# Patient Record
Sex: Female | Born: 1955 | Race: Black or African American | Hispanic: No | Smoking: Current every day smoker
Health system: Southern US, Community
[De-identification: ages and names within clinical notes are randomized; demographics above are authoritative.]

## PROBLEM LIST (undated history)

## (undated) DIAGNOSIS — G43909 Migraine, unspecified, not intractable, without status migrainosus: Secondary | ICD-10-CM

## (undated) DIAGNOSIS — I774 Celiac artery compression syndrome: Secondary | ICD-10-CM

## (undated) DIAGNOSIS — C801 Malignant (primary) neoplasm, unspecified: Secondary | ICD-10-CM

## (undated) DIAGNOSIS — E785 Hyperlipidemia, unspecified: Secondary | ICD-10-CM

## (undated) DIAGNOSIS — R079 Chest pain, unspecified: Secondary | ICD-10-CM

## (undated) HISTORY — PX: ABDOMINAL HYSTERECTOMY: SHX81

## (undated) HISTORY — PX: SPLENECTOMY: SUR1306

## (undated) HISTORY — DX: Celiac artery compression syndrome: I77.4

---

## 1999-07-28 ENCOUNTER — Ambulatory Visit (HOSPITAL_COMMUNITY): Admission: RE | Admit: 1999-07-28 | Discharge: 1999-07-28 | Payer: Self-pay | Admitting: Neurosurgery

## 1999-08-11 ENCOUNTER — Encounter: Payer: Self-pay | Admitting: Neurosurgery

## 1999-08-11 ENCOUNTER — Ambulatory Visit (HOSPITAL_COMMUNITY): Admission: RE | Admit: 1999-08-11 | Discharge: 1999-08-11 | Payer: Self-pay | Admitting: Neurosurgery

## 2001-05-22 ENCOUNTER — Emergency Department (HOSPITAL_COMMUNITY): Admission: EM | Admit: 2001-05-22 | Discharge: 2001-05-22 | Payer: Self-pay | Admitting: Emergency Medicine

## 2001-06-20 ENCOUNTER — Emergency Department (HOSPITAL_COMMUNITY): Admission: EM | Admit: 2001-06-20 | Discharge: 2001-06-20 | Payer: Self-pay | Admitting: *Deleted

## 2001-08-21 ENCOUNTER — Encounter: Payer: Self-pay | Admitting: Emergency Medicine

## 2001-08-21 ENCOUNTER — Emergency Department (HOSPITAL_COMMUNITY): Admission: EM | Admit: 2001-08-21 | Discharge: 2001-08-21 | Payer: Self-pay | Admitting: Emergency Medicine

## 2001-11-01 ENCOUNTER — Encounter: Payer: Self-pay | Admitting: Family Medicine

## 2001-11-01 ENCOUNTER — Ambulatory Visit (HOSPITAL_COMMUNITY): Admission: RE | Admit: 2001-11-01 | Discharge: 2001-11-01 | Payer: Self-pay | Admitting: Family Medicine

## 2001-11-07 ENCOUNTER — Ambulatory Visit (HOSPITAL_COMMUNITY): Admission: RE | Admit: 2001-11-07 | Discharge: 2001-11-07 | Payer: Self-pay | Admitting: Family Medicine

## 2001-11-07 ENCOUNTER — Encounter: Payer: Self-pay | Admitting: Family Medicine

## 2001-11-15 ENCOUNTER — Ambulatory Visit (HOSPITAL_COMMUNITY): Admission: RE | Admit: 2001-11-15 | Discharge: 2001-11-15 | Payer: Self-pay | Admitting: Family Medicine

## 2001-11-15 ENCOUNTER — Encounter: Payer: Self-pay | Admitting: Family Medicine

## 2001-11-24 ENCOUNTER — Encounter: Payer: Self-pay | Admitting: General Surgery

## 2001-11-24 ENCOUNTER — Ambulatory Visit (HOSPITAL_COMMUNITY): Admission: RE | Admit: 2001-11-24 | Discharge: 2001-11-24 | Payer: Self-pay | Admitting: General Surgery

## 2001-11-28 ENCOUNTER — Inpatient Hospital Stay (HOSPITAL_COMMUNITY): Admission: RE | Admit: 2001-11-28 | Discharge: 2001-12-02 | Payer: Self-pay | Admitting: General Surgery

## 2001-12-06 ENCOUNTER — Emergency Department (HOSPITAL_COMMUNITY): Admission: EM | Admit: 2001-12-06 | Discharge: 2001-12-06 | Payer: Self-pay | Admitting: Emergency Medicine

## 2001-12-09 ENCOUNTER — Encounter: Payer: Self-pay | Admitting: Internal Medicine

## 2001-12-09 ENCOUNTER — Emergency Department (HOSPITAL_COMMUNITY): Admission: EM | Admit: 2001-12-09 | Discharge: 2001-12-09 | Payer: Self-pay | Admitting: Internal Medicine

## 2001-12-11 ENCOUNTER — Inpatient Hospital Stay (HOSPITAL_COMMUNITY): Admission: EM | Admit: 2001-12-11 | Discharge: 2001-12-13 | Payer: Self-pay | Admitting: Emergency Medicine

## 2001-12-12 ENCOUNTER — Encounter: Payer: Self-pay | Admitting: Family Medicine

## 2002-02-02 ENCOUNTER — Encounter: Payer: Self-pay | Admitting: *Deleted

## 2002-02-02 ENCOUNTER — Emergency Department (HOSPITAL_COMMUNITY): Admission: EM | Admit: 2002-02-02 | Discharge: 2002-02-02 | Payer: Self-pay | Admitting: *Deleted

## 2002-08-19 ENCOUNTER — Emergency Department (HOSPITAL_COMMUNITY): Admission: EM | Admit: 2002-08-19 | Discharge: 2002-08-19 | Payer: Self-pay | Admitting: Emergency Medicine

## 2003-05-16 ENCOUNTER — Emergency Department (HOSPITAL_COMMUNITY): Admission: EM | Admit: 2003-05-16 | Discharge: 2003-05-16 | Payer: Self-pay | Admitting: *Deleted

## 2003-05-16 ENCOUNTER — Encounter: Payer: Self-pay | Admitting: *Deleted

## 2003-05-22 ENCOUNTER — Emergency Department (HOSPITAL_COMMUNITY): Admission: EM | Admit: 2003-05-22 | Discharge: 2003-05-22 | Payer: Self-pay | Admitting: Emergency Medicine

## 2003-05-23 ENCOUNTER — Ambulatory Visit (HOSPITAL_COMMUNITY): Admission: RE | Admit: 2003-05-23 | Discharge: 2003-05-23 | Payer: Self-pay | Admitting: Family Medicine

## 2003-05-23 ENCOUNTER — Encounter: Payer: Self-pay | Admitting: Family Medicine

## 2003-06-06 ENCOUNTER — Emergency Department (HOSPITAL_COMMUNITY): Admission: EM | Admit: 2003-06-06 | Discharge: 2003-06-06 | Payer: Self-pay | Admitting: Emergency Medicine

## 2004-01-17 ENCOUNTER — Emergency Department (HOSPITAL_COMMUNITY): Admission: EM | Admit: 2004-01-17 | Discharge: 2004-01-17 | Payer: Self-pay | Admitting: Emergency Medicine

## 2004-03-31 ENCOUNTER — Emergency Department (HOSPITAL_COMMUNITY): Admission: EM | Admit: 2004-03-31 | Discharge: 2004-03-31 | Payer: Self-pay | Admitting: *Deleted

## 2004-04-22 ENCOUNTER — Emergency Department (HOSPITAL_COMMUNITY): Admission: EM | Admit: 2004-04-22 | Discharge: 2004-04-22 | Payer: Self-pay | Admitting: Emergency Medicine

## 2004-05-11 ENCOUNTER — Emergency Department (HOSPITAL_COMMUNITY): Admission: EM | Admit: 2004-05-11 | Discharge: 2004-05-11 | Payer: Self-pay | Admitting: *Deleted

## 2004-06-18 ENCOUNTER — Emergency Department (HOSPITAL_COMMUNITY): Admission: EM | Admit: 2004-06-18 | Discharge: 2004-06-18 | Payer: Self-pay | Admitting: Emergency Medicine

## 2004-08-12 ENCOUNTER — Emergency Department (HOSPITAL_COMMUNITY): Admission: EM | Admit: 2004-08-12 | Discharge: 2004-08-12 | Payer: Self-pay | Admitting: Internal Medicine

## 2004-09-13 ENCOUNTER — Emergency Department (HOSPITAL_COMMUNITY): Admission: EM | Admit: 2004-09-13 | Discharge: 2004-09-13 | Payer: Self-pay | Admitting: Emergency Medicine

## 2004-10-19 ENCOUNTER — Ambulatory Visit (HOSPITAL_COMMUNITY): Admission: RE | Admit: 2004-10-19 | Discharge: 2004-10-19 | Payer: Self-pay | Admitting: Family Medicine

## 2005-08-06 ENCOUNTER — Emergency Department (HOSPITAL_COMMUNITY): Admission: EM | Admit: 2005-08-06 | Discharge: 2005-08-06 | Payer: Self-pay | Admitting: Emergency Medicine

## 2005-09-01 ENCOUNTER — Emergency Department (HOSPITAL_COMMUNITY): Admission: EM | Admit: 2005-09-01 | Discharge: 2005-09-01 | Payer: Self-pay | Admitting: Emergency Medicine

## 2005-09-23 ENCOUNTER — Ambulatory Visit: Payer: Self-pay | Admitting: Orthopedic Surgery

## 2005-10-06 ENCOUNTER — Encounter: Payer: Self-pay | Admitting: Orthopedic Surgery

## 2005-10-13 ENCOUNTER — Encounter: Admission: RE | Admit: 2005-10-13 | Discharge: 2005-10-13 | Payer: Self-pay | Admitting: Orthopedic Surgery

## 2005-10-25 ENCOUNTER — Ambulatory Visit (HOSPITAL_COMMUNITY): Admission: RE | Admit: 2005-10-25 | Discharge: 2005-10-25 | Payer: Self-pay | Admitting: Family Medicine

## 2005-11-12 ENCOUNTER — Emergency Department (HOSPITAL_COMMUNITY): Admission: EM | Admit: 2005-11-12 | Discharge: 2005-11-12 | Payer: Self-pay | Admitting: Emergency Medicine

## 2006-03-20 ENCOUNTER — Emergency Department (HOSPITAL_COMMUNITY): Admission: EM | Admit: 2006-03-20 | Discharge: 2006-03-20 | Payer: Self-pay | Admitting: Emergency Medicine

## 2007-03-16 ENCOUNTER — Ambulatory Visit: Payer: Self-pay | Admitting: Orthopedic Surgery

## 2007-04-04 ENCOUNTER — Ambulatory Visit: Payer: Self-pay | Admitting: Orthopedic Surgery

## 2007-10-10 ENCOUNTER — Ambulatory Visit (HOSPITAL_COMMUNITY): Admission: RE | Admit: 2007-10-10 | Discharge: 2007-10-10 | Payer: Self-pay | Admitting: Family Medicine

## 2007-10-10 ENCOUNTER — Emergency Department (HOSPITAL_COMMUNITY): Admission: EM | Admit: 2007-10-10 | Discharge: 2007-10-10 | Payer: Self-pay | Admitting: Emergency Medicine

## 2007-10-18 ENCOUNTER — Emergency Department (HOSPITAL_COMMUNITY): Admission: EM | Admit: 2007-10-18 | Discharge: 2007-10-18 | Payer: Self-pay | Admitting: Emergency Medicine

## 2007-11-04 ENCOUNTER — Emergency Department (HOSPITAL_COMMUNITY): Admission: EM | Admit: 2007-11-04 | Discharge: 2007-11-04 | Payer: Self-pay | Admitting: Emergency Medicine

## 2008-01-10 ENCOUNTER — Ambulatory Visit: Payer: Self-pay | Admitting: Orthopedic Surgery

## 2008-01-10 DIAGNOSIS — M25519 Pain in unspecified shoulder: Secondary | ICD-10-CM

## 2008-01-10 DIAGNOSIS — M5412 Radiculopathy, cervical region: Secondary | ICD-10-CM | POA: Insufficient documentation

## 2008-01-10 DIAGNOSIS — S139XXA Sprain of joints and ligaments of unspecified parts of neck, initial encounter: Secondary | ICD-10-CM | POA: Insufficient documentation

## 2008-01-22 ENCOUNTER — Telehealth: Payer: Self-pay | Admitting: Orthopedic Surgery

## 2008-02-06 ENCOUNTER — Encounter: Payer: Self-pay | Admitting: Orthopedic Surgery

## 2008-02-26 ENCOUNTER — Ambulatory Visit: Payer: Self-pay | Admitting: Orthopedic Surgery

## 2008-10-01 ENCOUNTER — Encounter: Payer: Self-pay | Admitting: Orthopedic Surgery

## 2008-10-01 ENCOUNTER — Ambulatory Visit (HOSPITAL_COMMUNITY): Admission: RE | Admit: 2008-10-01 | Discharge: 2008-10-01 | Payer: Self-pay | Admitting: Family Medicine

## 2009-05-28 ENCOUNTER — Emergency Department (HOSPITAL_COMMUNITY): Admission: EM | Admit: 2009-05-28 | Discharge: 2009-05-28 | Payer: Self-pay | Admitting: Emergency Medicine

## 2009-09-01 ENCOUNTER — Encounter: Payer: Self-pay | Admitting: Orthopedic Surgery

## 2009-09-05 ENCOUNTER — Emergency Department (HOSPITAL_COMMUNITY): Admission: EM | Admit: 2009-09-05 | Discharge: 2009-09-05 | Payer: Self-pay | Admitting: Emergency Medicine

## 2009-09-05 ENCOUNTER — Encounter: Payer: Self-pay | Admitting: Orthopedic Surgery

## 2009-09-16 ENCOUNTER — Ambulatory Visit: Payer: Self-pay | Admitting: Orthopedic Surgery

## 2009-09-16 DIAGNOSIS — M23302 Other meniscus derangements, unspecified lateral meniscus, unspecified knee: Secondary | ICD-10-CM | POA: Insufficient documentation

## 2009-09-16 DIAGNOSIS — M25569 Pain in unspecified knee: Secondary | ICD-10-CM

## 2009-09-18 ENCOUNTER — Telehealth: Payer: Self-pay | Admitting: Orthopedic Surgery

## 2009-09-19 ENCOUNTER — Ambulatory Visit (HOSPITAL_COMMUNITY): Admission: RE | Admit: 2009-09-19 | Discharge: 2009-09-19 | Payer: Self-pay | Admitting: Orthopedic Surgery

## 2009-10-01 ENCOUNTER — Encounter: Payer: Self-pay | Admitting: Orthopedic Surgery

## 2010-04-28 ENCOUNTER — Ambulatory Visit: Payer: Self-pay | Admitting: Cardiovascular Disease

## 2010-04-28 ENCOUNTER — Observation Stay (HOSPITAL_COMMUNITY): Admission: EM | Admit: 2010-04-28 | Discharge: 2010-04-30 | Payer: Self-pay | Admitting: Emergency Medicine

## 2010-04-29 ENCOUNTER — Encounter: Payer: Self-pay | Admitting: Cardiovascular Disease

## 2010-05-12 ENCOUNTER — Ambulatory Visit (HOSPITAL_COMMUNITY): Admission: RE | Admit: 2010-05-12 | Discharge: 2010-05-12 | Payer: Self-pay | Admitting: Family Medicine

## 2010-05-14 ENCOUNTER — Ambulatory Visit: Payer: Self-pay | Admitting: Cardiology

## 2010-05-14 ENCOUNTER — Ambulatory Visit (HOSPITAL_COMMUNITY): Admission: RE | Admit: 2010-05-14 | Discharge: 2010-05-14 | Payer: Self-pay | Admitting: Family Medicine

## 2010-05-14 ENCOUNTER — Encounter (INDEPENDENT_AMBULATORY_CARE_PROVIDER_SITE_OTHER): Payer: Self-pay | Admitting: Family Medicine

## 2010-08-11 ENCOUNTER — Emergency Department (HOSPITAL_COMMUNITY)
Admission: EM | Admit: 2010-08-11 | Discharge: 2010-08-11 | Payer: Self-pay | Source: Home / Self Care | Admitting: Emergency Medicine

## 2010-09-06 ENCOUNTER — Encounter: Payer: Self-pay | Admitting: Family Medicine

## 2010-09-17 NOTE — Assessment & Plan Note (Signed)
Summary: [facility] er left knee pain xr there/st bcbs/bsf   Vital Signs:  Patient profile:   55 year old female Weight:      190 pounds Pulse rate:   88 / minute Resp:     16 per minute  Vitals Entered By: Fuller Canada MD (September 16, 2009 3:21 PM)  Visit Type:  new problem Referring Provider:  [facility] Primary Provider:  Dr. Renard Matter  CC:  left knee pain.  History of Present Illness: This is a 55 year old female presents with one year history of burning in her LEFT knee which got worse last week required to go the emergency room.  There is not as bad today but at that time was burning severely and throbbing.  Symptoms are intermittent to come and go.  It was sudden onset associated with swelling.  Is worse with walking or lifting or bending the knee.  Elevating the limb seems to help.  Occasionally the pain radiates to the shin.  Pain is minimal burning is severe  Review of systems  Xrays left knee 09/05/09, no meds were given for pain.  Advil 400mg  as needed does not help.  L spine xray taken 10/01/08  ESI and L spine MRI 2007 for review.  Allergies: 1)  ! Aspirin 2)  ! Codeine 3)  ! Tylox 4)  ! 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 Cardiac :  Denies chest pain, angina, heart attack, heart failure, poor circulation, blood clots, and phlebitis. Resp:  Denies short of breath, difficulty breathing, COPD, cough, and pneumonia. GI:  Denies nausea, vomiting, diarrhea, constipation, difficulty swallowing, ulcers, GERD, and reflux. GU:  Denies kidney failure, kidney transplant, kidney stones, burning, poor stream, testicular cancer, blood in urine, and . Neuro:  Denies headache, dizziness,  migraines, numbness, weakness, tremor, and unsteady walking. MS:  Denies joint pain, rheumatoid arthritis, joint swelling, gout, bone cancer, osteoporosis, and . Endo:  Denies thyroid disease, goiter, and diabetes. Psych:  Denies depression, mood swings, anxiety, panic attack, bipolar, and schizophrenia. Derm:  Denies eczema, cancer, and itching. EENT:  Denies poor vision, cataracts, glaucoma, poor hearing, vertigo, ears ringing, sinusitis, hoarseness, toothaches, and bleeding gums; eye pain and headache. Immunology:  Denies seasonal allergies, sinus problems, and allergic to bee stings. Lymphatic:  Denies lymph node cancer and lymph edema.  Physical Exam  Msk:  this is a well-nourished female remaining hygiene are intact body habitus is medium  Cardiovascular normal  Lymph nodes negative and Lortab  Skin RIGHT and LEFT knee normal  Neurologic exam normal  Most distal exam shows a modest limp favoring the LEFT leg  Inspection reveals mild swelling in the LEFT knee  Range of motion limited to 90.  Motor exam normal.  Stability test were normal.  McMurray sign was positive.  RIGHT knee full range of motion good strength normal stability no swelling   Extremities:     Impression & Recommendations:  Problem # 1:  DERANGEMENT MENISCUS (ICD-717.5) Assessment New The x-rays were done at [facility]. The report and the films have been reviewed. no fracture is seen  Orders: Est. Patient Level IV (91478)  Problem # 2:  KNEE PAIN (GNF-621.30) Assessment: New  Her updated medication list for this  problem includes:    Robaxin 500 Mg Tabs (Methocarbamol) ..... One by mouth q 6 hrs prn  Orders: Est. Patient Level IV (16109)  I think she has a torn medial meniscus with a locked fragment recommend MRI.  She does not want any pain medicine everything makes her throw up  She says she is medicines at home.  Patient Instructions: 1)  MRI left knee

## 2010-09-17 NOTE — Letter (Signed)
Summary: History form  History form   Imported By: Jacklynn Ganong 09/19/2009 09:03:51  _____________________________________________________________________  External Attachment:    Type:   Image     Comment:   External Document

## 2010-09-17 NOTE — Miscellaneous (Signed)
Summary: brace  Clinical Lists Changes  patient states that the economy hinge was too big and she could not work in that type of brace, she said she will try an OTC knee sleeve instead and see if that helps her knee

## 2010-09-17 NOTE — Progress Notes (Signed)
Summary: mri apt 09/19/09 reg at 430pm aph dr to call with results  Phone Note Outgoing Call Call back at [facility] Phone [phone]   Summary of Call: called and lmom for patient to go for mri 09/19/09 reg at 430pm left knee aph, precert number [id number] expires 30 days from 09/18/09. [employer]. Dr to call with results Initial call taken by: Ether Griffins,  September 18, 2009 4:35 PM

## 2010-10-09 ENCOUNTER — Emergency Department (HOSPITAL_COMMUNITY)
Admission: EM | Admit: 2010-10-09 | Discharge: 2010-10-09 | Disposition: A | Discharge status: Home / Self Care | Payer: BC Managed Care – PPO | Attending: Emergency Medicine | Admitting: Emergency Medicine

## 2010-10-09 ENCOUNTER — Emergency Department (HOSPITAL_COMMUNITY): Payer: BC Managed Care – PPO

## 2010-10-09 DIAGNOSIS — M25569 Pain in unspecified knee: Secondary | ICD-10-CM | POA: Insufficient documentation

## 2010-10-09 DIAGNOSIS — M25469 Effusion, unspecified knee: Secondary | ICD-10-CM | POA: Insufficient documentation

## 2010-10-22 ENCOUNTER — Ambulatory Visit (HOSPITAL_COMMUNITY)
Admission: RE | Admit: 2010-10-22 | Discharge: 2010-10-22 | Disposition: A | Discharge status: Home / Self Care | Payer: BC Managed Care – PPO | Source: Ambulatory Visit | Attending: Family Medicine | Admitting: Family Medicine

## 2010-10-22 ENCOUNTER — Other Ambulatory Visit (HOSPITAL_COMMUNITY): Payer: Self-pay | Admitting: Family Medicine

## 2010-10-22 DIAGNOSIS — J329 Chronic sinusitis, unspecified: Secondary | ICD-10-CM

## 2010-10-22 DIAGNOSIS — R51 Headache: Secondary | ICD-10-CM | POA: Insufficient documentation

## 2010-10-29 LAB — CBC
HCT: 38.5 % (ref 36.0–46.0)
Hemoglobin: 13.1 g/dL (ref 12.0–15.0)
RDW: 13.8 % (ref 11.5–15.5)
WBC: 12.8 10*3/uL — ABNORMAL HIGH (ref 4.0–10.5)

## 2010-10-29 LAB — DIFFERENTIAL
Basophils Absolute: 0.1 10*3/uL (ref 0.0–0.1)
Eosinophils Relative: 1 % (ref 0–5)
Lymphocytes Relative: 32 % (ref 12–46)
Monocytes Absolute: 0.6 10*3/uL (ref 0.1–1.0)

## 2010-10-29 LAB — CARDIAC PANEL(CRET KIN+CKTOT+MB+TROPI)
CK, MB: 1.3 ng/mL (ref 0.3–4.0)
CK, MB: 1.5 ng/mL (ref 0.3–4.0)
Relative Index: 0.9 (ref 0.0–2.5)
Total CK: 146 U/L (ref 7–177)
Total CK: 175 U/L (ref 7–177)
Troponin I: <0.01 ng/mL (ref 0.00–0.06)
Troponin I: <0.01 ng/mL (ref 0.00–0.06)

## 2010-10-29 LAB — POCT I-STAT, CHEM 8
BUN: 8 mg/dL (ref 6–23)
Chloride: 108 mEq/L (ref 96–112)
Creatinine, Ser: 0.5 mg/dL (ref 0.4–1.2)
Glucose, Bld: 117 mg/dL — ABNORMAL HIGH (ref 70–99)
Potassium: 3.5 mEq/L (ref 3.5–5.1)

## 2010-10-29 LAB — POCT CARDIAC MARKERS
CKMB, poc: 1 ng/mL (ref 1.0–8.0)
Troponin i, poc: <0.05 ng/mL (ref 0.00–0.09)

## 2010-10-29 LAB — PROTIME-INR
INR: 0.97 (ref 0.00–1.49)
Prothrombin Time: 13.1 seconds (ref 11.6–15.2)

## 2010-10-29 LAB — MRSA PCR SCREENING: MRSA by PCR: NEGATIVE

## 2010-11-01 LAB — URIC ACID: Uric Acid, Serum: 3.4 mg/dL (ref 2.4–7.0)

## 2011-01-01 NOTE — Op Note (Signed)
[name], Jacqueline Haney              ACCOUNT NO.:  [id number]   MEDICAL RECORD NO.:  [id number]          PATIENT TYPE:  EMS   LOCATION:  ED                            FACILITY:  [facility]   PHYSICIAN:  Carren Rang, M.D.    DATE OF BIRTH:  1955/09/23   DATE OF PROCEDURE:  09/13/2004  DATE OF DISCHARGE:  09/13/2004                                 OPERATIVE REPORT   ADDENDUM:   OPERATION/PROCEDURE:  Lumbar puncture.   INDICATIONS:  Sudden onset of acute new type headache.   DESCRIPTION OF PROCEDURE:  After Betadine and alcohol prep, local anesthesia  was achieved with a 1% lidocaine injection.  Spinal needle was introduced  and there was initial pink tinge which rapidly cleared.  Opening pressure  was 260 mH2O water, approximately 10 mL of clear CSF were obtained and  closing pressure was 230 mH2O.  The patient tolerated the procedure well.  CSF is sent to the lab for cell count only.      VC/MEDQ  D:  09/13/2004  T:  09/14/2004  Job:  [id number]

## 2011-01-01 NOTE — H&P (Signed)
[facility]  Patient:    Jacqueline Haney, DESANCTIS Visit Number: [id number] MRN: [id number]          Service Type: MED Location: 3A A326 01 Attending Physician:  Annamarie Dawley Dictated by:   Butch Penny, M.D. Admit Date:  12/10/2001                           History and Physical  HISTORY OF PRESENT ILLNESS:  The patient is a 55 year old African-American female admitted to the hospital with nausea, vomiting, and headache mainly over the forehead with maximum intensity over the left eye of two days duration.  The patient began vomiting.  Headache was of moderate severity. She was seen by ED physician at [facility] and given IV medication for pain, Dilaudid, and Phenergan, and IV fluids.  She continued to vomit intermittently and was subsequently admitted.  LABORATORY DATA:  WBC 2400, hemoglobin 12.9, hematocrit 38.3.  FAMILY HISTORY:  Noncontributory.  SOCIAL HISTORY:  The patient is a cigarette smoker.  Does not use alcohol.  PAST MEDICAL HISTORY: 1. Splenectomy. 2. Hysterectomy.  ALLERGIES:  No known allergies.  CURRENT MEDICATIONS:  Augmentin.  Ibuprofen.  Tylox p.r.n.  REVIEW OF SYSTEMS:  HEENT:  Pain over left forehead.  CARDIOPULMONARY:  No cough, hemoptysis, or dyspnea.  GASTROINTESTINAL:  No bowel irregularity or bleeding.  GENITOURINARY:  No dysuria or hematuria.  PHYSICAL EXAMINATION:  VITAL SIGNS:  BP 127/87, respirations 20, pulse 86, temperature 98.6.  HEENT:  Eyes PERRLA.  TMs negative.  Oropharynx benign.  NECK:  Supple.  No JVD or thyroid abnormalities.  LUNGS:  Clear to P&A.  HEART:  Regular rhythm.  No murmurs.  ABDOMEN:  No palpable organs or masses.  SKIN:  Warm and dry.  EXTREMITIES:  Free of edema.  NEUROLOGIC:  No focal deficit.  DIAGNOSIS:  Frontal headache with vomiting. Dictated by:   Butch Penny, M.D. Attending Physician:  Annamarie Dawley DD:  12/11/01 TD:  12/11/01 Job:  [phone]

## 2011-01-01 NOTE — Discharge Summary (Signed)
[facility]  Patient:    Jacqueline Haney, Jacqueline Haney Visit Number: [id number] MRN: [id number]          Service Type: MED Location: 3A A326 01 Attending Physician:  Alice Reichert Dictated by:   Butch Penny, M.D. Admit Date:  12/10/2001 Discharge Date: 12/13/2001                             Discharge Summary  IDENTIFYING INFORMATION:  A 55 year old African-American female admitted December 11, 2001, discharged December 13, 2001, two days hospitalization.  DIAGNOSES: 1. Headache. 2. Vomiting. 3. Status post recent splenectomy. 4. Gastritis.  DISPOSITION:  Condition stable and improved at time of discharge.  HISTORY OF PRESENT ILLNESS:  This 55 year old female admitted to the hospital with nausea and vomiting and headache mainly over the forehead with maximum intensity over the left eye with two days duration. The patient began vomiting, headache was of moderate severity. She was seen by ED physician at [facility] and given IV medication for pain, Dilaudid, Phenergan, IV fluids. She continued to vomit intermittently and was subsequently admitted.  PHYSICAL EXAMINATION:  VITAL SIGNS:  Alert female with blood pressure 127/87, respirations 20, pulse 86, temp 98.6.  HEENT:  Eyes PERRLA. TMs negative. Oropharynx benign.  NECK:  Supple. No JVD or thyroid abnormalities.  LUNGS:  Clear to P&A.  HEART:  Regular rhythm, no murmurs.  ABDOMEN:  No palpable organs or masses. The patient has a healing incision in the left upper quadrant.  SKIN:  Warm and dry.  EXTREMITIES:  Free of edema.  NEUROLOGICAL:  No focal deficit.  LABORATORY AND ACCESSORY DATA:  Admission CBC:  WBC 8000, hemoglobin 12.4, hematocrit 37.1, 66 neutrophils, 25 lymphocytes. Chemistries:  Sodium 142, potassium 3.5, chloride 106, CO2 29, glucose 95, BUN 5, creatinine 0.6, calcium 9.4. LIver Enzymes: SGPT 22, SGOT 49, alkaline phosphatase 95, total bilirubin 0.3.  CT of  the Abdomen:  Left basilar atelectasis, evidence of recent splenectomy, bowel gas grossly unremarkable, 1.1 cm left adrenal lesion, status post hysterectomy.  HOSPITAL COURSE:  The patient was started on intravenous fluids at the time of her admission, IV normal saline, was given Dilaudid 2 mg intravenously p.r.n. for pain, and Phenergan 12.5 mg q.4 h. p.r.n. for nausea. The patient was placed on a clear liquid diet. Subsequently, she was given Zofran 4 mg IV q.6 h. p.r.n. for vomiting, Augmentin XR 2 g b.i.d. was started on April 28th because of possibility of sinusitis. Her liver enzymes were normal. The patient showed a progressive improvement during her two-day hospitalization, was gradually able to keep solid food down, was started on diet on December 12, 2001. This was advanced. The patient showed progressive improvement and was able to be discharged.  DISCHARGE MEDICATIONS: 1. Phenergan 25 mg q.4 h. p.r.n. for nausea. 2. Lortab 5 mg q.4 h. p.r.n. for pain. 3. ______ 300 mg one b.i.d. Dictated by:   Butch Penny, M.D. Attending Physician:  Alice Reichert DD:  01/01/02 TD:  01/02/02 Job: [id number]

## 2011-01-01 NOTE — Discharge Summary (Signed)
[facility]  Patient:    Jacqueline Haney, Jacqueline Haney Visit Number: [id number] MRN: [id number]          Service Type: MED Location: 3A A326 01 Attending Physician:  Alice Reichert Dictated by:   Elpidio Anis, M.D. Admit Date:  12/10/2001 Discharge Date: 12/13/2001                             Discharge Summary  DISCHARGE DIAGNOSES: 1. Splenic mass. 2. Chronic abdominal pain.  SPECIAL PROCEDURES:  Splenectomy on April 15th.  DISPOSITION:  The patient is discharged home in stable satisfactory condition.  DISCHARGE MEDICATIONS: 1. Phenergan 25 mg every 4 hours as needed for nausea. 2. Tylox 2 tablets 2-4 hours p.r.n. pain.  FOLLOWUP:  The patient was advised to be seen in the office on the 28th of April.  HOSPITAL SUMMARY:  A 55 year old female with a history of left flank and left upper quadrant pain for about three weeks prior to admission. She also had some right-sided back pain. There was no history of injury. She was evaluated by Dr. Renard Matter and a CT scan showed a splenic lesion that was suspicious. The mass had complex internal heterogeneity. It was felt that malignancy was a possibility and she was scheduled for a splenectomy.  PAST MEDICAL HISTORY:  Unremarkable except for previous surgery which is noted in the admission note.  PHYSICAL EXAMINATION:  ABDOMEN:  General physical examination was unremarkable except that she had moderate epigastric and right upper quadrant tenderness. She also had mild tenderness in the left upper quadrant.  LABORATORY DATA:  Preoperative lab data were normal.  HOSPITAL COURSE:  The patient was taken through day surgery and underwent splenectomy uneventfully on November 28, 2001. Slightly enlarged spleen was encountered. Mass and the substance of the kidney could not be palpated. In the postoperative period she had low-grade temperature for two days. White count increased to 19,600 which was not unusual for  postsplenectomy state. Platelets were not increased significantly while she was hospitalized. She tolerated liquids well and her postoperative course was otherwise unremarkable. At the time of discharge, white count was 11.4, hemoglobin 12.7, and her chemistry panel was normal. She had no problems with her wound. She was discharged home in stable satisfactory condition. Dictated by:   Elpidio Anis, M.D. Attending Physician:  Alice Reichert DD:  12/22/01 TD:  12/25/01 Job: [id number]

## 2011-01-01 NOTE — Group Therapy Note (Signed)
[facility]  Patient:    Jacqueline Haney, Jacqueline Haney Visit Number: [id number] MRN: [id number]          Service Type: MED Location: 3A A326 01 Attending Physician:  Annamarie Dawley Dictated by:   Butch Penny, M.D. Admit Date:  12/10/2001                               Progress Note  SUBJECTIVE:  This patient continued to vomit intermittently yesterday.  She had chemistries done.  Liver profile was within normal limits.  Amylase 71, lipase 24.  CT of the abdomen is scheduled today.  Patient is feeling some better with less nausea and vomiting.  OBJECTIVE:  VITAL SIGNS:  Blood pressure 108/77, respirations 20, pulse 54, temperature 97.7.  LUNGS:  Clear to P&A.  HEART:  Regular rhythm.  ABDOMEN:  No palpable organs or masses.  Patient has incision over left upper abdomen, healing well.  ASSESSMENT:  The patient was admitted with vomiting and headache, wretching. Patient has recently had abdominal surgery by Dr. Katrinka Blazing, removal of cyst of spleen.  PLAN:  Obtain abdominal CT today.  Continue current regimen.  Will advance diet. Dictated by:   Butch Penny, M.D. Attending Physician:  Annamarie Dawley DD:  12/12/01 TD:  12/12/01 Job: [id number]

## 2011-01-01 NOTE — H&P (Signed)
[facility]  Patient:    Jacqueline Haney, Jacqueline Haney Visit Number: [id number] MRN: [id number]          Service Type: OUT Location: RAD Attending Physician:  Dessa Phi Dictated by:   Elpidio Anis, M.D. Admit Date:  11/24/2001 Discharge Date: 11/24/2001                           History and Physical  HISTORY OF PRESENT ILLNESS:  A 55 year old female with a history of left flank and left upper quadrant pain for about three weeks or more.  She has also had right-sided back pain.  No history of recent injury.  On evaluation, CT scan showed splenic lesion that was suspicious and complex with internal heterogeneity.  It was felt that malignancy was a possibility, and the patient was scheduled for splenectomy.  MEDICATIONS:  None.  PAST MEDICAL HISTORY:  Chronic medical illnesses: None.  Surgery:  Ovarian cystectomy and oophorectomy, total abdominal hysterectomy, and right carpal tunnel release.  SOCIAL HISTORY:  She is separated and lives alone.  She is employed.  She smoked one pack of cigarettes per day, does not drink or use drugs.  ALLERGIES:  No known drug allergies.  PHYSICAL EXAMINATION:  VITAL SIGNS:  Blood pressure 114/82, pulse 80, respirations 18, weight 172 pounds.  HEENT:  Unremarkable.  NECK:  Supple without JVD or bruit.  CHEST:  Clear to auscultation.  HEART:  Regular rate and rhythm without murmur, gallop, or rub.  ABDOMEN:  Moderate epigastric and right upper quadrant tenderness.  Normal bowel sounds.  No masses.  Mild tenderness in the left upper quadrant.  EXTREMITIES:  No cyanosis, clubbing, or edema.  NEUROLOGIC:  No focal motor, sensory, or cerebellar deficit.  IMPRESSION: 1. Splenic mass with suspicious characteristics. 2. Chronic abdominal pain.  PLAN:  Splenectomy. Dictated by:   Elpidio Anis, M.D. Attending Physician:  Dessa Phi DD:  11/28/01 TD:  11/28/01 Job: [id number]

## 2011-01-01 NOTE — Op Note (Signed)
[facility]  Patient:    Jacqueline Haney, Jacqueline Haney Visit Number: [id number] MRN: [id number]          Service Type: DSU Location: DAY Attending Physician:  Dessa Phi Dictated by:   Elpidio Anis, M.D. Proc. Date: 11/28/01 Admit Date:  11/28/2001                             Operative Report  PREOPERATIVE DIAGNOSIS:  Splenic mass.  POSTOPERATIVE DIAGNOSIS:  Splenic mass.  OPERATION/PROCEDURE:  Splenectomy.  SURGEON:  Elpidio Anis, M.D.  ASSISTANT:  Franky Macho, M.D.  DESCRIPTION:  Under general anesthesia the patients abdomen was prepped and draped as a sterile field.  A left subcostal incision was made.  A Buchwalter retractor was placed.  Examination of the upper abdomen was unremarkable.  The stomach was retracted medially and short gastrics were dissected.  They were divided with an LVS stapler or between Hemoclips.  This was continued up to the apex.  This was continued along the greater curvature to the apex of the spleen.  There were no diaphragmatic connections.  The vessels of the splenic hilum were dissected.  They were encircled with ligatures of silk and divided. The hilum vessels were dissected, tied with double #2 ligatures of 0 silk, and divided.  The other superficial attachments were dissected, clipped with Hemoclips, and divided.  The spleen was delivered as a specimen.  Hemostasis was achieved.  The hilar vessels were again doubly tied with 2-0 silk.  There was no other bleeding vessel.  The patient tolerated the procedure well.  The short gastrics appeared to be well controlled.  Being satisfied that there was no evidence of bleeding and no evidence of injury to the tail of the spleen the procedure was terminated.  Copious irrigation was carried out.  Sponge, needle, instrument, and blade counts were verified as correct.  The abdomen was closed with running locking 0 Prolene in three layers.  The subcutaneous tissue was closed with  2-0 Biosyn.  Skin was closed with staples.  She tolerated the procedure well.  A sterile dressing was placed.  She was transferred to a bed and taken to the postanesthetic care unit. Dictated by:   Elpidio Anis, M.D. Attending Physician:  Dessa Phi DD:  11/28/01 TD:  11/28/01 Job: [id number]

## 2011-10-17 ENCOUNTER — Emergency Department (HOSPITAL_COMMUNITY)
Admission: EM | Admit: 2011-10-17 | Discharge: 2011-10-17 | Disposition: A | Discharge status: Home / Self Care | Payer: BC Managed Care – PPO | Attending: Emergency Medicine | Admitting: Emergency Medicine

## 2011-10-17 ENCOUNTER — Emergency Department (HOSPITAL_COMMUNITY): Payer: BC Managed Care – PPO

## 2011-10-17 ENCOUNTER — Other Ambulatory Visit: Payer: Self-pay

## 2011-10-17 ENCOUNTER — Encounter (HOSPITAL_COMMUNITY): Payer: Self-pay | Admitting: *Deleted

## 2011-10-17 DIAGNOSIS — I498 Other specified cardiac arrhythmias: Secondary | ICD-10-CM | POA: Insufficient documentation

## 2011-10-17 DIAGNOSIS — R42 Dizziness and giddiness: Secondary | ICD-10-CM | POA: Insufficient documentation

## 2011-10-17 DIAGNOSIS — R002 Palpitations: Secondary | ICD-10-CM | POA: Insufficient documentation

## 2011-10-17 LAB — BASIC METABOLIC PANEL
CO2: 23 mEq/L (ref 19–32)
Calcium: 9.7 mg/dL (ref 8.4–10.5)
Chloride: 105 mEq/L (ref 96–112)
Creatinine, Ser: 0.47 mg/dL — ABNORMAL LOW (ref 0.50–1.10)
GFR calc Af Amer: >90 mL/min (ref >90)
Sodium: 139 mEq/L (ref 135–145)

## 2011-10-17 LAB — CBC
MCHC: 34.1 g/dL (ref 30.0–36.0)
Platelets: 364 10*3/uL (ref 150–400)
RDW: 13.9 % (ref 11.5–15.5)
WBC: 13 10*3/uL — ABNORMAL HIGH (ref 4.0–10.5)

## 2011-10-17 LAB — DIFFERENTIAL
Basophils Absolute: 0 10*3/uL (ref 0.0–0.1)
Basophils Relative: 0 % (ref 0–1)
Eosinophils Relative: 1 % (ref 0–5)
Lymphocytes Relative: 40 % (ref 12–46)
Monocytes Absolute: 1 10*3/uL (ref 0.1–1.0)
Neutro Abs: 6.7 10*3/uL (ref 1.7–7.7)

## 2011-10-17 MED ORDER — IBUPROFEN 800 MG PO TABS
800.0000 mg | ORAL_TABLET | Freq: Once | ORAL | Status: DC
  Filled 2011-10-17: qty 1

## 2011-10-17 NOTE — ED Notes (Signed)
Pt reports fast heartbeat x 1 wk,  Denies cp at any time.  Pt reports this morning she felt dizzy and felt like her heart rate was beating fast.  Reports she got scared and came here.  Denies cp at this time.  Denies n/v/d, denies sob.  Pt alert and oriented x 4.

## 2011-10-17 NOTE — ED Provider Notes (Signed)
History   This chart was scribed for [employer]. Colon Branch, MD by Clarita Crane. The patient was seen in room APA09/APA09. Patient's care was started at 1146.    CSN: [id number]  Arrival date & time 10/17/11  1146   First MD Initiated Contact with Patient 10/17/11 1241      Chief Complaint  Patient presents with  . irregular heartbeat     (Consider location/radiation/quality/duration/timing/severity/associated sxs/prior treatment) HPI Jacqueline Haney is a 56 y.o. female who presents to the Emergency Department complaining of intermittent episodes of palpitations described as her heart racing with associated dizziness onset 1 week ago and persistent since. Patient states she experienced an episode of palpitations this morning which awoke her from her sleep but episode resolved on its own. Denies chest pain, SOB, nausea, vomiting.   History reviewed. No pertinent past medical history.  Past Surgical History  Procedure Date  . Abdominal hysterectomy   . Splenectomy     No family history on file.  History  Substance Use Topics  . Smoking status: Current Everyday Smoker    Types: Cigarettes  . Smokeless tobacco: Not on file  . Alcohol Use: No    OB History    Grav Para Term Preterm Abortions TAB SAB Ect Mult Living                  Review of Systems 10 Systems reviewed and are negative for acute change except as noted in the HPI.  Allergies  Aspirin; Codeine; Hydrocodone; and Oxycodone-acetaminophen  Home Medications  No current outpatient prescriptions on file.  BP 141/83  Temp(Src) 98.7 F (37.1 C) (Oral)  Resp 20  Ht 5\' 6"  (1.676 m)  Wt 177 lb (80.287 kg)  BMI 28.57 kg/m2  SpO2 100%  Physical Exam  Nursing note and vitals reviewed. Constitutional: She is oriented to person, place, and time. She appears well-developed and well-nourished. No distress.  HENT:  Head: Normocephalic and atraumatic.  Eyes: EOM are normal. Pupils are equal, round, and reactive to  light.  Neck: Neck supple. No tracheal deviation present.  Cardiovascular: Normal rate and regular rhythm.  Exam reveals no gallop and no friction rub.   No murmur heard. Pulmonary/Chest: Effort normal. No respiratory distress. She has no wheezes. She has no rales.  Abdominal: Soft. She exhibits no distension.  Musculoskeletal: Normal range of motion. She exhibits no edema.  Neurological: She is alert and oriented to person, place, and time. No sensory deficit.  Skin: Skin is warm and dry.  Psychiatric: She has a normal mood and affect. Her behavior is normal.    ED Course  Procedures (including critical care time)  DIAGNOSTIC STUDIES: Oxygen Saturation is 100% on room air, normal by my interpretation.    COORDINATION OF CARE: 1:04PM- Patient informed of current plan for treatment and evaluation and agrees with plan at this time. Patient informed of need to follow up with Dr. Renard Matter regarding palpitations.    Results for orders placed during the hospital encounter of 10/17/11  CBC      Component Value Range   WBC 13.0 (*) 4.0 - 10.5 (K/uL)   RBC 4.91  3.87 - 5.11 (MIL/uL)   Hemoglobin 14.3  12.0 - 15.0 (g/dL)   HCT 40.9  81.1 - 91.4 (%)   MCV 85.3  78.0 - 100.0 (fL)   MCH 29.1  26.0 - 34.0 (pg)   MCHC 34.1  30.0 - 36.0 (g/dL)   RDW 78.2  95.6 - 21.3 (%)  Platelets 364  150 - 400 (K/uL)  DIFFERENTIAL      Component Value Range   Neutrophils Relative 51  43 - 77 (%)   Neutro Abs 6.7  1.7 - 7.7 (K/uL)   Lymphocytes Relative 40  12 - 46 (%)   Lymphs Abs 5.2 (*) 0.7 - 4.0 (K/uL)   Monocytes Relative 8  3 - 12 (%)   Monocytes Absolute 1.0  0.1 - 1.0 (K/uL)   Eosinophils Relative 1  0 - 5 (%)   Eosinophils Absolute 0.1  0.0 - 0.7 (K/uL)   Basophils Relative 0  0 - 1 (%)   Basophils Absolute 0.0  0.0 - 0.1 (K/uL)  BASIC METABOLIC PANEL      Component Value Range   Sodium 139  135 - 145 (mEq/L)   Potassium 3.5  3.5 - 5.1 (mEq/L)   Chloride 105  96 - 112 (mEq/L)   CO2 23   19 - 32 (mEq/L)   Glucose, Bld 113 (*) 70 - 99 (mg/dL)   BUN 10  6 - 23 (mg/dL)   Creatinine, Ser 1.61 (*) 0.50 - 1.10 (mg/dL)   Calcium 9.7  8.4 - 09.6 (mg/dL)   GFR calc non Af Amer >90  >90 (mL/min)   GFR calc Af Amer >90  >90 (mL/min)    Dg Chest 2 View  10/17/2011  *RADIOLOGY REPORT*  Clinical Data: Irregular heart beat  CHEST - 2 VIEW  Comparison: Chest radiograph 04/28/2010  Findings: Normal mediastinum and cardiac silhouette.  Normal pulmonary  vasculature.  No evidence of effusion, infiltrate, or pneumothorax.  No acute bony abnormality.  Surgical clips in the gastric cardiac region and posterior to the stomach  IMPRESSION: No acute cardiopulmonary process.  Original Report Authenticated By: Genevive Bi, M.D.    Date: 10/17/2011  1148  Rate: 75  Rhythm: normal sinus rhythm  QRS Axis: normal  Intervals: normal  ST/T Wave abnormalities: nonspecific ST changes  Conduction Disutrbances:none  Narrative Interpretation: possible inferior subendocardial injury (cited 10/15/10)  Old EKG Reviewed: unchanged c/w 10/15/10   MDM  Patient with three episodes of palpitations this week. Most recent was this morning at 5:30 AM accompanied by chest discomfort and mild shortness of breath. Currently in NSR, rate 60s. Discussed with patient evaluation using holter monitor. Labs were unremarkable. EKG unchangedl.She will see PCP tomorrow..Pt stable in ED with no significant deterioration in condition.The patient appears reasonably screened and/or stabilized for discharge and I doubt any other medical condition or other [facility] requiring further screening, evaluation, or treatment in the ED at this time prior to discharge.  I personally performed the services described in this documentation, which was scribed in my presence. The recorded information has been reviewed and considered.  MDM Reviewed: nursing note and vitals Interpretation: ECG and labs        [employer]. Colon Branch, MD 10/17/11 1320

## 2011-10-19 ENCOUNTER — Encounter: Payer: Self-pay | Admitting: Cardiovascular Disease

## 2011-10-20 ENCOUNTER — Ambulatory Visit: Payer: BC Managed Care – PPO | Admitting: Cardiovascular Disease

## 2011-10-20 ENCOUNTER — Other Ambulatory Visit: Payer: Self-pay | Admitting: *Deleted

## 2011-11-02 DIAGNOSIS — R002 Palpitations: Secondary | ICD-10-CM

## 2011-11-15 ENCOUNTER — Emergency Department (HOSPITAL_COMMUNITY)
Admission: EM | Admit: 2011-11-15 | Discharge: 2011-11-15 | Disposition: A | Discharge status: Home / Self Care | Payer: BC Managed Care – PPO | Attending: Emergency Medicine | Admitting: Emergency Medicine

## 2011-11-15 ENCOUNTER — Encounter (HOSPITAL_COMMUNITY): Payer: Self-pay | Admitting: Emergency Medicine

## 2011-11-15 DIAGNOSIS — S39012A Strain of muscle, fascia and tendon of lower back, initial encounter: Secondary | ICD-10-CM

## 2011-11-15 DIAGNOSIS — Z886 Allergy status to analgesic agent status: Secondary | ICD-10-CM | POA: Insufficient documentation

## 2011-11-15 DIAGNOSIS — F17200 Nicotine dependence, unspecified, uncomplicated: Secondary | ICD-10-CM | POA: Insufficient documentation

## 2011-11-15 DIAGNOSIS — S335XXA Sprain of ligaments of lumbar spine, initial encounter: Secondary | ICD-10-CM | POA: Insufficient documentation

## 2011-11-15 DIAGNOSIS — Z9089 Acquired absence of other organs: Secondary | ICD-10-CM | POA: Insufficient documentation

## 2011-11-15 MED ORDER — METHOCARBAMOL 500 MG PO TABS: ORAL_TABLET | ORAL | Status: DC
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
## 2011-11-15 NOTE — ED Provider Notes (Signed)
Medical screening examination/treatment/procedure(s) were performed by non-physician practitioner and as supervising physician I was immediately available for consultation/collaboration.   Alyra Patty L Danessa Mensch, MD 11/15/11 2250 

## 2011-11-16 ENCOUNTER — Other Ambulatory Visit: Payer: Self-pay | Admitting: *Deleted

## 2011-11-16 ENCOUNTER — Other Ambulatory Visit: Payer: Self-pay | Admitting: Cardiovascular Disease

## 2011-11-16 DIAGNOSIS — R002 Palpitations: Secondary | ICD-10-CM

## 2011-11-17 ENCOUNTER — Ambulatory Visit: Payer: BC Managed Care – PPO | Admitting: Cardiology

## 2012-03-17 ENCOUNTER — Other Ambulatory Visit (HOSPITAL_COMMUNITY): Payer: Self-pay | Admitting: Family Medicine

## 2012-03-17 DIAGNOSIS — Z139 Encounter for screening, unspecified: Secondary | ICD-10-CM

## 2012-03-23 ENCOUNTER — Ambulatory Visit (HOSPITAL_COMMUNITY)
Admission: RE | Admit: 2012-03-23 | Discharge: 2012-03-23 | Disposition: A | Discharge status: Home / Self Care | Payer: BC Managed Care – PPO | Source: Ambulatory Visit | Attending: Family Medicine | Admitting: Family Medicine

## 2012-03-23 DIAGNOSIS — Z139 Encounter for screening, unspecified: Secondary | ICD-10-CM

## 2012-03-23 DIAGNOSIS — Z1231 Encounter for screening mammogram for malignant neoplasm of breast: Secondary | ICD-10-CM | POA: Insufficient documentation

## 2012-05-15 ENCOUNTER — Encounter (HOSPITAL_COMMUNITY): Payer: Self-pay | Admitting: *Deleted

## 2012-05-15 ENCOUNTER — Emergency Department (HOSPITAL_COMMUNITY)
Admission: EM | Admit: 2012-05-15 | Discharge: 2012-05-16 | Disposition: A | Discharge status: Home / Self Care | Payer: BC Managed Care – PPO | Attending: Emergency Medicine | Admitting: Emergency Medicine

## 2012-05-15 DIAGNOSIS — R1032 Left lower quadrant pain: Secondary | ICD-10-CM | POA: Insufficient documentation

## 2012-05-15 DIAGNOSIS — R109 Unspecified abdominal pain: Secondary | ICD-10-CM

## 2012-05-15 NOTE — ED Notes (Signed)
LLQ pain,x 2 hours, No NVD.

## 2012-05-16 ENCOUNTER — Emergency Department (HOSPITAL_COMMUNITY): Payer: BC Managed Care – PPO

## 2012-05-16 LAB — CBC WITH DIFFERENTIAL/PLATELET
Basophils Absolute: 0 10*3/uL (ref 0.0–0.1)
Basophils Relative: 0 % (ref 0–1)
Eosinophils Absolute: 0.1 10*3/uL (ref 0.0–0.7)
Eosinophils Relative: 1 % (ref 0–5)
HCT: 40.4 % (ref 36.0–46.0)
Hemoglobin: 14.1 g/dL (ref 12.0–15.0)
Lymphocytes Relative: 40 % (ref 12–46)
Lymphs Abs: 5.2 10*3/uL — ABNORMAL HIGH (ref 0.7–4.0)
MCH: 29.7 pg (ref 26.0–34.0)
MCHC: 34.9 g/dL (ref 30.0–36.0)
MCV: 85.2 fL (ref 78.0–100.0)
Monocytes Absolute: 0.9 10*3/uL (ref 0.1–1.0)
Monocytes Relative: 7 % (ref 3–12)
Neutro Abs: 6.8 10*3/uL (ref 1.7–7.7)
Neutrophils Relative %: 52 % (ref 43–77)
Platelets: 340 10*3/uL (ref 150–400)
RBC: 4.74 MIL/uL (ref 3.87–5.11)
RDW: 14 % (ref 11.5–15.5)
WBC: 13 10*3/uL — ABNORMAL HIGH (ref 4.0–10.5)

## 2012-05-16 LAB — BASIC METABOLIC PANEL WITH GFR
BUN: 15 mg/dL (ref 6–23)
CO2: 26 meq/L (ref 19–32)
Calcium: 9.4 mg/dL (ref 8.4–10.5)
Chloride: 103 meq/L (ref 96–112)
Creatinine, Ser: 0.52 mg/dL (ref 0.50–1.10)
GFR calc Af Amer: >90 mL/min
GFR calc non Af Amer: >90 mL/min
Glucose, Bld: 122 mg/dL — ABNORMAL HIGH (ref 70–99)
Potassium: 3.4 meq/L — ABNORMAL LOW (ref 3.5–5.1)
Sodium: 138 meq/L (ref 135–145)

## 2012-05-16 LAB — URINALYSIS, ROUTINE W REFLEX MICROSCOPIC
Bilirubin Urine: NEGATIVE
Glucose, UA: NEGATIVE mg/dL
Ketones, ur: NEGATIVE mg/dL
Leukocytes, UA: NEGATIVE
Nitrite: NEGATIVE
Protein, ur: NEGATIVE mg/dL
Specific Gravity, Urine: 1.02 (ref 1.005–1.030)
Urobilinogen, UA: 0.2 mg/dL (ref 0.0–1.0)
pH: 6 (ref 5.0–8.0)

## 2012-05-16 LAB — URINE MICROSCOPIC-ADD ON

## 2012-05-16 MED ORDER — SODIUM CHLORIDE 0.9 % IV SOLN
INTRAVENOUS | Status: DC
  Administered 2012-05-16 (×2): via INTRAVENOUS

## 2012-05-16 MED ORDER — ONDANSETRON HCL 4 MG/2ML IJ SOLN
4.0000 mg | Freq: Once | INTRAMUSCULAR | Status: AC
Start: 2012-05-16 — End: 2012-05-16
  Administered 2012-05-16: 4 mg via INTRAVENOUS
  Filled 2012-05-16: qty 2

## 2012-05-16 MED ORDER — ONDANSETRON 4 MG PO TBDP
4.0000 mg | ORAL_TABLET | Freq: Once | ORAL | Status: AC
  Administered 2012-05-16: 4 mg via ORAL
  Filled 2012-05-16: qty 1

## 2012-05-16 MED ORDER — FENTANYL CITRATE 0.05 MG/ML IJ SOLN
100.0000 ug | Freq: Once | INTRAMUSCULAR | Status: AC
  Administered 2012-05-16: 100 ug via INTRAVENOUS
  Filled 2012-05-16: qty 2

## 2012-05-16 MED ORDER — IOHEXOL 300 MG/ML  SOLN
100.0000 mL | Freq: Once | INTRAMUSCULAR | Status: AC | PRN
  Administered 2012-05-16: 100 mL via INTRAVENOUS

## 2012-05-16 NOTE — ED Notes (Signed)
Pt discharged. Pt stable at time of discharge. pt has no questions regarding discharge at this time. Pt voiced understanding of discharge instructions.  

## 2012-05-16 NOTE — ED Provider Notes (Signed)
History     CSN: [id number]  Arrival date & time 05/15/12  2150   First MD Initiated Contact with Patient 05/16/12 0001      Chief Complaint  Patient presents with  . Abdominal Pain    (Consider location/radiation/quality/duration/timing/severity/associated sxs/prior treatment) HPI This is a 33 your black female with about a 3-4 hour history of left lower quadrant pain. The pain came on suddenly and is described as an 8/10. It is worse with movement or palpation. There is no associated nausea, vomiting, diarrhea, fever, chills, dysuria or hematuria. She has no history of nephrolithiasis or diverticulitis. She is status post hysterectomy.  History reviewed. No pertinent past medical history.  Past Surgical History  Procedure Date  . Abdominal hysterectomy   . Splenectomy     History reviewed. No pertinent family history.  History  Substance Use Topics  . Smoking status: Current Every Day Smoker    Types: Cigarettes  . Smokeless tobacco: Not on file  . Alcohol Use: No    OB History    Grav Para Term Preterm Abortions TAB SAB Ect Mult Living                  Review of Systems  All other systems reviewed and are negative.    Allergies  Aspirin; Codeine; Hydrocodone; and Oxycodone-acetaminophen  Home Medications   Current Outpatient Rx  Name Route Sig Dispense Refill  . METHOCARBAMOL 500 MG PO TABS  2 po tid for spasm 30 tablet 0    BP 112/73  Pulse 64  Temp 97.4 F (36.3 C) (Oral)  Resp 18  Ht 5\' 6"  (1.676 m)  Wt 176 lb (79.833 kg)  BMI 28.41 kg/m2  SpO2 94%  Physical Exam General: Well-developed, well-nourished female in no acute distress; appearance consistent with age of record HENT: normocephalic, atraumatic Eyes: pupils equal round and reactive to light; extraocular muscles intact Neck: supple Heart: regular rate and rhythm Lungs: clear to auscultation bilaterally Abdomen: soft; nondistended; left lower quadrant tenderness; no masses or  hepatosplenomegaly; bowel sounds present : No flank tenderness Extremities: No deformity; full range of motion; pulses normal; no edema Neurologic: Awake, alert and oriented; motor function intact in all extremities and symmetric; no facial droop Skin: Warm and dry Psychiatric: Normal mood and affect    ED Course  Procedures (including critical care time)    MDM   Nursing notes and vitals signs, including pulse oximetry, reviewed.  Summary of this visit's results, reviewed by myself:  Labs:  Results for orders placed during the hospital encounter of 05/15/12  CBC WITH DIFFERENTIAL      Component Value Range   WBC 13.0 (*) 4.0 - 10.5 K/uL   RBC 4.74  3.87 - 5.11 MIL/uL   Hemoglobin 14.1  12.0 - 15.0 g/dL   HCT 78.2  95.6 - 21.3 %   MCV 85.2  78.0 - 100.0 fL   MCH 29.7  26.0 - 34.0 pg   MCHC 34.9  30.0 - 36.0 g/dL   RDW 08.6  57.8 - 46.9 %   Platelets 340  150 - 400 K/uL   Neutrophils Relative 52  43 - 77 %   Lymphocytes Relative 40  12 - 46 %   Monocytes Relative 7  3 - 12 %   Eosinophils Relative 1  0 - 5 %   Basophils Relative 0  0 - 1 %   Neutro Abs 6.8  1.7 - 7.7 K/uL   Lymphs Abs 5.2 (*)  0.7 - 4.0 K/uL   Monocytes Absolute 0.9  0.1 - 1.0 K/uL   Eosinophils Absolute 0.1  0.0 - 0.7 K/uL   Basophils Absolute 0.0  0.0 - 0.1 K/uL   WBC Morphology ATYPICAL LYMPHOCYTES    BASIC METABOLIC PANEL      Component Value Range   Sodium 138  135 - 145 mEq/L   Potassium 3.4 (*) 3.5 - 5.1 mEq/L   Chloride 103  96 - 112 mEq/L   CO2 26  19 - 32 mEq/L   Glucose, Bld 122 (*) 70 - 99 mg/dL   BUN 15  6 - 23 mg/dL   Creatinine, Ser 1.61  0.50 - 1.10 mg/dL   Calcium 9.4  8.4 - 09.6 mg/dL   GFR calc non Af Amer >90  >90 mL/min   GFR calc Af Amer >90  >90 mL/min  URINALYSIS, ROUTINE W REFLEX MICROSCOPIC      Component Value Range   Color, Urine YELLOW  YELLOW   APPearance CLEAR  CLEAR   Specific Gravity, Urine 1.020  1.005 - 1.030   pH 6.0  5.0 - 8.0   Glucose, UA NEGATIVE   NEGATIVE mg/dL   Hgb urine dipstick TRACE (*) NEGATIVE   Bilirubin Urine NEGATIVE  NEGATIVE   Ketones, ur NEGATIVE  NEGATIVE mg/dL   Protein, ur NEGATIVE  NEGATIVE mg/dL   Urobilinogen, UA 0.2  0.0 - 1.0 mg/dL   Nitrite NEGATIVE  NEGATIVE   Leukocytes, UA NEGATIVE  NEGATIVE  URINE MICROSCOPIC-ADD ON      Component Value Range   Squamous Epithelial / LPF RARE  RARE   WBC, UA 0-2  <3 WBC/hpf   RBC / HPF 0-2  <3 RBC/hpf    Imaging Studies: Ct Abdomen Pelvis W Contrast  05/16/2012  *RADIOLOGY REPORT*  Clinical Data: Left lower quadrant abdominal pain.  CT ABDOMEN AND PELVIS WITH CONTRAST  Technique:  Multidetector CT imaging of the abdomen and pelvis was performed following the standard protocol during bolus administration of intravenous contrast.  Contrast: OMNIPAQUE IOHEXOL 300 MG/ML  SOLN  Comparison: 12/12/2001  Findings: Mild middle lobe scarring or atelectasis.  Heart size within normal limits.  No pleural or pericardial effusion.  Low attenuation of the liver suggests fatty infiltration.  Absent spleen.  Unremarkable pancreas and biliary system.  Bilateral incompletely characterized adrenal nodules measuring 1.8 cm on the left and 1.1 cm on the right.  Subcentimeter hypodensity left kidney.  Too small further characterize though favored to be a simple cyst or benign fat containing lesion.  No hydronephrosis or hydroureter.  No bowel obstruction.  No CT evidence for colitis.  Normal appendix.  No free intraperitoneal air or fluid.  No lymphadenopathy.  There is scattered atherosclerotic calcification of the aorta and its branches. No aneurysmal dilatation.  Thin-walled bladder.  Absent uterus.  No adnexal mass.  No acute osseous finding.  IMPRESSION: No acute intra-abdominal process identified.  Status post splenectomy.  Hepatic steatosis.  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  Consider adrenal MRI to confirm.    Original Report Authenticated By: Waneta Martins, M.D.    5:20 AM Patient's pain and tenderness have improved. Patient was advised of unremarkable CT and lab findings. She was advised return should symptoms worsen or change.         Hanley Seamen, MD 05/16/12 [phone]

## 2012-09-12 ENCOUNTER — Emergency Department (HOSPITAL_COMMUNITY)
Admission: EM | Admit: 2012-09-12 | Discharge: 2012-09-12 | Disposition: A | Discharge status: Home / Self Care | Payer: BC Managed Care – PPO | Attending: Emergency Medicine | Admitting: Emergency Medicine

## 2012-09-12 ENCOUNTER — Emergency Department (HOSPITAL_COMMUNITY): Payer: BC Managed Care – PPO

## 2012-09-12 ENCOUNTER — Encounter (HOSPITAL_COMMUNITY): Payer: Self-pay | Admitting: *Deleted

## 2012-09-12 DIAGNOSIS — F17200 Nicotine dependence, unspecified, uncomplicated: Secondary | ICD-10-CM | POA: Insufficient documentation

## 2012-09-12 DIAGNOSIS — R059 Cough, unspecified: Secondary | ICD-10-CM | POA: Insufficient documentation

## 2012-09-12 DIAGNOSIS — J111 Influenza due to unidentified influenza virus with other respiratory manifestations: Secondary | ICD-10-CM

## 2012-09-12 DIAGNOSIS — J329 Chronic sinusitis, unspecified: Secondary | ICD-10-CM

## 2012-09-12 DIAGNOSIS — R05 Cough: Secondary | ICD-10-CM | POA: Insufficient documentation

## 2012-09-12 DIAGNOSIS — Z79899 Other long term (current) drug therapy: Secondary | ICD-10-CM | POA: Insufficient documentation

## 2012-09-12 LAB — RAPID STREP SCREEN (MED CTR MEBANE ONLY): Streptococcus, Group A Screen (Direct): NEGATIVE

## 2012-09-12 MED ORDER — TRAMADOL HCL 50 MG PO TABS: 50.0000 mg | ORAL_TABLET | Freq: Four times a day (QID) | ORAL | Status: DC | PRN

## 2012-09-12 MED ORDER — OSELTAMIVIR PHOSPHATE 75 MG PO CAPS: 75.0000 mg | ORAL_CAPSULE | Freq: Two times a day (BID) | ORAL | Status: DC

## 2012-09-12 MED ORDER — ONDANSETRON 4 MG PO TBDP
4.0000 mg | ORAL_TABLET | Freq: Once | ORAL | Status: AC
  Administered 2012-09-12: 4 mg via ORAL
  Filled 2012-09-12: qty 1

## 2012-09-12 MED ORDER — KETOROLAC TROMETHAMINE 30 MG/ML IJ SOLN
60.0000 mg | Freq: Once | INTRAMUSCULAR | Status: AC
  Administered 2012-09-12: 60 mg via INTRAMUSCULAR
  Filled 2012-09-12: qty 2

## 2012-09-12 MED ORDER — AMOXICILLIN 500 MG PO CAPS: 500.0000 mg | ORAL_CAPSULE | Freq: Three times a day (TID) | ORAL | Status: DC

## 2012-09-12 MED ORDER — OSELTAMIVIR PHOSPHATE 75 MG PO CAPS
75.0000 mg | ORAL_CAPSULE | Freq: Once | ORAL | Status: AC
  Administered 2012-09-12: 75 mg via ORAL
  Filled 2012-09-12: qty 1

## 2012-09-12 NOTE — ED Notes (Signed)
Fever, chills, body aches, sore throat, NV x3 today

## 2012-09-12 NOTE — ED Provider Notes (Signed)
History     CSN: [id number]  Arrival date & time 09/12/12  1804   First MD Initiated Contact with Patient 09/12/12 1805      Chief Complaint  Patient presents with  . Emesis    (Consider location/radiation/quality/duration/timing/severity/associated sxs/prior treatment) Patient is a 57 y.o. female presenting with cough. The history is provided by the patient.  Cough This is a new problem. The current episode started 2 days ago. The problem occurs constantly. The problem has not changed since onset.The cough is non-productive. Associated symptoms include chills. Pertinent negatives include no chest pain and no headaches. She has tried decongestants for the symptoms. The treatment provided no relief.    History reviewed. No pertinent past medical history.  Past Surgical History  Procedure Date  . Abdominal hysterectomy   . Splenectomy     History reviewed. No pertinent family history.  History  Substance Use Topics  . Smoking status: Current Every Day Smoker    Types: Cigarettes  . Smokeless tobacco: Not on file  . Alcohol Use: No    OB History    Grav Para Term Preterm Abortions TAB SAB Ect Mult Living                  Review of Systems  Constitutional: Positive for chills. Negative for fatigue.  HENT: Negative for congestion, sinus pressure and ear discharge.   Eyes: Negative for discharge.  Respiratory: Positive for cough.   Cardiovascular: Negative for chest pain.  Gastrointestinal: Negative for abdominal pain and diarrhea.  Genitourinary: Negative for frequency and hematuria.  Musculoskeletal: Negative for back pain.  Skin: Negative for rash.  Neurological: Negative for seizures and headaches.  Hematological: Negative.   Psychiatric/Behavioral: Negative for hallucinations.    Allergies  Aspirin; Codeine; Hydrocodone; Oxycodone-acetaminophen; and Tape  Home Medications   Current Outpatient Rx  Name  Route  Sig  Dispense  Refill  .  DM-PHENYLEPHRINE-ACETAMINOPHEN 10-5-325 MG PO CAPS   Oral   Take 2 capsules by mouth 2 (two) times daily as needed. For symptoms         . HYDROCHLOROTHIAZIDE 25 MG PO TABS   Oral   Take 25 mg by mouth every morning.         Marland Kitchen NAPROXEN SODIUM 220 MG PO TABS   Oral   Take 440 mg by mouth every morning.         Marland Kitchen AMOXICILLIN 500 MG PO CAPS   Oral   Take 1 capsule (500 mg total) by mouth 3 (three) times daily.   30 capsule   0   . OSELTAMIVIR PHOSPHATE 75 MG PO CAPS   Oral   Take 1 capsule (75 mg total) by mouth every 12 (twelve) hours.   10 capsule   0   . TRAMADOL HCL 50 MG PO TABS   Oral   Take 1 tablet (50 mg total) by mouth every 6 (six) hours as needed for pain.   15 tablet   0     BP 143/84  Pulse 86  Temp 98.4 F (36.9 C) (Oral)  Ht 5\' 4"  (1.626 m)  Wt 175 lb (79.379 kg)  BMI 30.04 kg/m2  SpO2 99%  Physical Exam  Constitutional: She is oriented to person, place, and time. She appears well-developed.  HENT:  Head: Normocephalic and atraumatic.       Tender max sinuses  Eyes: Conjunctivae normal and EOM are normal. No scleral icterus.  Neck: Neck supple. No thyromegaly present.  Cardiovascular:  Normal rate and regular rhythm.  Exam reveals no gallop and no friction rub.   No murmur heard. Pulmonary/Chest: No stridor. She has no wheezes. She has no rales. She exhibits no tenderness.  Abdominal: She exhibits no distension. There is no tenderness. There is no rebound.  Musculoskeletal: Normal range of motion. She exhibits no edema.  Lymphadenopathy:    She has no cervical adenopathy.  Neurological: She is oriented to person, place, and time. Coordination normal.  Skin: No rash noted. No erythema.  Psychiatric: She has a normal mood and affect. Her behavior is normal.    ED Course  Procedures (including critical care time)   Labs Reviewed  RAPID STREP SCREEN   Dg Chest 2 View  09/12/2012  *RADIOLOGY REPORT*  Clinical Data: Fever, chills and  shortness of breath.  CHEST - 2 VIEW  Comparison: 10/17/2011 and prior chest radiographs.  Findings: Cardiomegaly is identified. There is no evidence of focal airspace disease, pulmonary edema, suspicious pulmonary nodule/mass, pleural effusion, or pneumothorax. No acute bony abnormalities are identified.  IMPRESSION: Cardiomegaly without evidence of acute cardiopulmonary disease.   Original Report Authenticated By: Harmon Pier, M.D.      1. Influenza   2. Sinusitis       MDM         Benny Lennert, MD 09/12/12 1930

## 2012-09-12 NOTE — ED Notes (Signed)
Pt reporting limited improvement from zofran.  Expressing desire to go home.  ED physician aware and at bedside.

## 2012-09-21 ENCOUNTER — Emergency Department (HOSPITAL_COMMUNITY): Payer: Worker's Compensation

## 2012-09-21 ENCOUNTER — Emergency Department (HOSPITAL_COMMUNITY)
Admission: EM | Admit: 2012-09-21 | Discharge: 2012-09-21 | Disposition: A | Discharge status: Home / Self Care | Payer: Worker's Compensation | Attending: Emergency Medicine | Admitting: Emergency Medicine

## 2012-09-21 ENCOUNTER — Encounter (HOSPITAL_COMMUNITY): Payer: Self-pay | Admitting: Emergency Medicine

## 2012-09-21 DIAGNOSIS — F17200 Nicotine dependence, unspecified, uncomplicated: Secondary | ICD-10-CM | POA: Insufficient documentation

## 2012-09-21 DIAGNOSIS — S8012XA Contusion of left lower leg, initial encounter: Secondary | ICD-10-CM

## 2012-09-21 DIAGNOSIS — X503XXA Overexertion from repetitive movements, initial encounter: Secondary | ICD-10-CM | POA: Insufficient documentation

## 2012-09-21 DIAGNOSIS — Y9301 Activity, walking, marching and hiking: Secondary | ICD-10-CM | POA: Insufficient documentation

## 2012-09-21 DIAGNOSIS — Y9229 Other specified public building as the place of occurrence of the external cause: Secondary | ICD-10-CM | POA: Insufficient documentation

## 2012-09-21 DIAGNOSIS — S8010XA Contusion of unspecified lower leg, initial encounter: Secondary | ICD-10-CM | POA: Insufficient documentation

## 2012-09-21 DIAGNOSIS — Z79899 Other long term (current) drug therapy: Secondary | ICD-10-CM | POA: Insufficient documentation

## 2012-09-21 DIAGNOSIS — Y990 Civilian activity done for income or pay: Secondary | ICD-10-CM | POA: Insufficient documentation

## 2012-09-21 MED ORDER — TRAMADOL HCL 50 MG PO TABS
50.0000 mg | ORAL_TABLET | Freq: Once | ORAL | Status: AC
  Administered 2012-09-21: 50 mg via ORAL
  Filled 2012-09-21: qty 1

## 2012-09-21 MED ORDER — TRAMADOL HCL 50 MG PO TABS: 50.0000 mg | ORAL_TABLET | Freq: Four times a day (QID) | ORAL | Status: DC | PRN

## 2012-09-21 NOTE — ED Provider Notes (Signed)
History     CSN: [id number]  Arrival date & time 09/21/12  1030   First MD Initiated Contact with Patient 09/21/12 1058      Chief Complaint  Patient presents with  . Leg Injury    (Consider location/radiation/quality/duration/timing/severity/associated sxs/prior treatment) HPI Comments: Patient c/o pain to her left lower leg that began after walking into a metal pole and struck her lower leg on the pole.  She denies fall, numbness, weakness or pain to the knee joint.    Patient is a 57 y.o. female presenting with leg pain. The history is provided by the patient.  Leg Pain  The incident occurred 1 to 2 hours ago. The incident occurred at work. The injury mechanism was a direct blow. The pain is present in the left leg. The pain is moderate. The pain has been constant since onset. Pertinent negatives include no numbness, no inability to bear weight, no loss of motion, no muscle weakness, no loss of sensation and no tingling. She reports no foreign bodies present. The symptoms are aggravated by activity, bearing weight and palpation. She has tried ice for the symptoms. The treatment provided no relief.    History reviewed. No pertinent past medical history.  Past Surgical History  Procedure Date  . Abdominal hysterectomy   . Splenectomy     History reviewed. No pertinent family history.  History  Substance Use Topics  . Smoking status: Current Every Day Smoker    Types: Cigarettes  . Smokeless tobacco: Not on file  . Alcohol Use: No    OB History    Grav Para Term Preterm Abortions TAB SAB Ect Mult Living                  Review of Systems  Constitutional: Negative for fever and chills.  Genitourinary: Negative for dysuria and difficulty urinating.  Musculoskeletal: Positive for arthralgias. Negative for back pain and joint swelling.  Skin: Negative for color change and wound.  Neurological: Negative for tingling and numbness.  All other systems reviewed and are  negative.    Allergies  Aspirin; Codeine; Hydrocodone; Oxycodone-acetaminophen; and Tape  Home Medications   Current Outpatient Rx  Name  Route  Sig  Dispense  Refill  . AMOXICILLIN 500 MG PO CAPS   Oral   Take 1 capsule (500 mg total) by mouth 3 (three) times daily.   30 capsule   0   . HYDROCHLOROTHIAZIDE 25 MG PO TABS   Oral   Take 25 mg by mouth every morning.         . OSELTAMIVIR PHOSPHATE 75 MG PO CAPS   Oral   Take 1 capsule (75 mg total) by mouth every 12 (twelve) hours.   10 capsule   0   . TRAMADOL HCL 50 MG PO TABS   Oral   Take 1 tablet (50 mg total) by mouth every 6 (six) hours as needed for pain.   15 tablet   0   . TRAMADOL HCL 50 MG PO TABS   Oral   Take 1 tablet (50 mg total) by mouth every 6 (six) hours as needed for pain.   20 tablet   0     BP 115/77  Pulse 73  Temp 98.3 F (36.8 C) (Oral)  Resp 20  Ht 5\' 5"  (1.651 m)  Wt 175 lb (79.379 kg)  BMI 29.12 kg/m2  SpO2 100%  Physical Exam  Nursing note and vitals reviewed. Constitutional: She is oriented to  person, place, and time. She appears well-developed and well-nourished. No distress.  Cardiovascular: Normal rate, regular rhythm, normal heart sounds and intact distal pulses.   Pulmonary/Chest: Effort normal and breath sounds normal.  Musculoskeletal: She exhibits tenderness. She exhibits no edema.       Left lower leg: She exhibits tenderness and bony tenderness. She exhibits no swelling, no edema, no deformity and no laceration.       Legs:      Localized ttp to the anterior left lower leg. Left knee joint is NT.  Patient has FROM of the left knee and ankle.  No erythema, bruising or bony deformity.  Dp pulse is brisk, distal sensation intact, calf is also NT.  Neurological: She is alert and oriented to person, place, and time. She exhibits normal muscle tone. Coordination normal.  Skin: Skin is warm and dry. No erythema.    ED Course  Procedures (including critical care  time)  Labs Reviewed - No data to display Dg Knee Complete 4 Views Left  09/21/2012  *RADIOLOGY REPORT*  Clinical Data: Distal anterior knee pain after injury today.  LEFT KNEE - COMPLETE 4+ VIEW  Comparison: 10/09/2010  Findings: No acute fracture or dislocation.  No joint effusion.  A tiny accessory ossicle or area of heterotopic ossification which projects just lateral to the patellar tendon and is unchanged.  IMPRESSION: No acute osseous abnormality.   Original Report Authenticated By: Jeronimo Greaves, M.D.      1. Contusion of leg, left       MDM    Patient agrees to elevate and apply ice packs on/off, follow-up with her PMD.  Likely contusion.    Prescribed: Ultram (Prescription for cane also given)     Soyla Bainter L. Kiante Petrovich, [place] 09/21/12 1623

## 2012-09-21 NOTE — ED Provider Notes (Signed)
Medical screening examination/treatment/procedure(s) were performed by non-physician practitioner and as supervising physician I was immediately available for consultation/collaboration.   Carleene Cooper III, MD 09/21/12 2034

## 2012-09-21 NOTE — ED Notes (Signed)
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 Pt hurting to left knee area.

## 2012-10-19 ENCOUNTER — Emergency Department (HOSPITAL_COMMUNITY)
Admission: EM | Admit: 2012-10-19 | Discharge: 2012-10-19 | Disposition: A | Discharge status: Home / Self Care | Payer: BC Managed Care – PPO | Attending: Emergency Medicine | Admitting: Emergency Medicine

## 2012-10-19 ENCOUNTER — Encounter (HOSPITAL_COMMUNITY): Payer: Self-pay | Admitting: *Deleted

## 2012-10-19 DIAGNOSIS — Z79899 Other long term (current) drug therapy: Secondary | ICD-10-CM | POA: Insufficient documentation

## 2012-10-19 DIAGNOSIS — R509 Fever, unspecified: Secondary | ICD-10-CM | POA: Insufficient documentation

## 2012-10-19 DIAGNOSIS — J329 Chronic sinusitis, unspecified: Secondary | ICD-10-CM | POA: Insufficient documentation

## 2012-10-19 DIAGNOSIS — Z90710 Acquired absence of both cervix and uterus: Secondary | ICD-10-CM | POA: Insufficient documentation

## 2012-10-19 DIAGNOSIS — F17200 Nicotine dependence, unspecified, uncomplicated: Secondary | ICD-10-CM | POA: Insufficient documentation

## 2012-10-19 DIAGNOSIS — R51 Headache: Secondary | ICD-10-CM | POA: Insufficient documentation

## 2012-10-19 DIAGNOSIS — R52 Pain, unspecified: Secondary | ICD-10-CM | POA: Insufficient documentation

## 2012-10-19 DIAGNOSIS — R109 Unspecified abdominal pain: Secondary | ICD-10-CM | POA: Insufficient documentation

## 2012-10-19 MED ORDER — TRAMADOL HCL 50 MG PO TABS
50.0000 mg | ORAL_TABLET | Freq: Once | ORAL | Status: AC
  Administered 2012-10-19: 50 mg via ORAL
  Filled 2012-10-19: qty 1

## 2012-10-19 MED ORDER — TRAMADOL HCL 50 MG PO TABS: 50.0000 mg | ORAL_TABLET | Freq: Four times a day (QID) | ORAL | Status: DC | PRN

## 2012-10-19 MED ORDER — AMOXICILLIN 500 MG PO CAPS: 500.0000 mg | ORAL_CAPSULE | Freq: Three times a day (TID) | ORAL | Status: DC

## 2012-10-19 NOTE — ED Notes (Signed)
C/o body aches, facial pain onset today

## 2012-10-19 NOTE — ED Provider Notes (Signed)
History  This chart was scribed for Benny Lennert, MD, by Candelaria Stagers, ED Scribe. This patient was seen in room [facility] and the patient's care was started at 8:07 PM   CSN: [id number]  Arrival date & time 10/19/12  4098   First MD Initiated Contact with Patient 10/19/12 2005      Chief Complaint  Patient presents with  . Influenza     The history is provided by the patient. No language interpreter was used.   Jacqueline Haney is a 57 y.o. female who presents to the Emergency Department complaining of body aches and congestion that started today.  Pt is also experiencing a headache, sinus pressure, fever, and abdominal pain.  She denies chills or cough.  Nothing seems to make the sx better or worse.  Pt was diagnosed with the flu and prescribed Tamiflu one month ago.  History reviewed. No pertinent past medical history.  Past Surgical History  Procedure Laterality Date  . Abdominal hysterectomy    . Splenectomy      No family history on file.  History  Substance Use Topics  . Smoking status: Current Every Day Smoker    Types: Cigarettes  . Smokeless tobacco: Not on file  . Alcohol Use: No    OB History   Grav Para Term Preterm Abortions TAB SAB Ect Mult Living                  Review of Systems  Constitutional: Positive for fever. Negative for fatigue.  HENT: Positive for sinus pressure. Negative for congestion and ear discharge.   Eyes: Negative for discharge.  Respiratory: Negative for cough.   Cardiovascular: Negative for chest pain.  Gastrointestinal: Positive for abdominal pain. Negative for diarrhea.  Genitourinary: Negative for frequency and hematuria.  Musculoskeletal: Negative for back pain.  Skin: Negative for rash.  Neurological: Positive for headaches. Negative for seizures.  Psychiatric/Behavioral: Negative for hallucinations.    Allergies  Aspirin; Codeine; Hydrocodone; Oxycodone-acetaminophen; and Tape  Home Medications   Current  Outpatient Rx  Name  Route  Sig  Dispense  Refill  . amoxicillin (AMOXIL) 500 MG capsule   Oral   Take 1 capsule (500 mg total) by mouth 3 (three) times daily.   30 capsule   0   . hydrochlorothiazide (HYDRODIURIL) 25 MG tablet   Oral   Take 25 mg by mouth every morning.         Marland Kitchen oseltamivir (TAMIFLU) 75 MG capsule   Oral   Take 1 capsule (75 mg total) by mouth every 12 (twelve) hours.   10 capsule   0   . traMADol (ULTRAM) 50 MG tablet   Oral   Take 1 tablet (50 mg total) by mouth every 6 (six) hours as needed for pain.   15 tablet   0   . traMADol (ULTRAM) 50 MG tablet   Oral   Take 1 tablet (50 mg total) by mouth every 6 (six) hours as needed for pain.   20 tablet   0     BP 118/72  Pulse 95  Temp(Src) 99.2 F (37.3 C) (Oral)  Resp 18  Ht 5\' 5"  (1.651 m)  Wt 175 lb (79.379 kg)  BMI 29.12 kg/m2  SpO2 99%  Physical Exam  Constitutional: She is oriented to person, place, and time. She appears well-developed.  HENT:  Head: Normocephalic and atraumatic.  Tenderness to bilateral maxillary and frontal sinuses.   Eyes: Conjunctivae and EOM are normal.  No scleral icterus.  Neck: Neck supple. No thyromegaly present.  Cardiovascular: Normal rate and regular rhythm.  Exam reveals no gallop and no friction rub.   No murmur heard. Pulmonary/Chest: No stridor. She has no wheezes. She has no rales. She exhibits no tenderness.  Abdominal: She exhibits no distension. There is no tenderness. There is no rebound.  Musculoskeletal: Normal range of motion. She exhibits no edema.  Lymphadenopathy:    She has no cervical adenopathy.  Neurological: She is oriented to person, place, and time. Coordination normal.  Skin: No rash noted. No erythema.  Psychiatric: She has a normal mood and affect. Her behavior is normal.    ED Course  Procedures   DIAGNOSTIC STUDIES: Oxygen Saturation is 99% on room air, normal by my interpretation.    COORDINATION OF CARE:  8:09 PM  Discussed course of care with pt which includes antibiotics for sinus infection.  Pt understands and agrees.     Labs Reviewed - No data to display No results found.   No diagnosis found.    MDM    The chart was scribed for me under my direct supervision.  I personally performed the history, physical, and medical decision making and all procedures in the evaluation of this patient.Benny Lennert, MD 10/19/12 2125

## 2013-05-04 ENCOUNTER — Other Ambulatory Visit (HOSPITAL_COMMUNITY): Payer: Self-pay | Admitting: Internal Medicine

## 2013-05-04 ENCOUNTER — Ambulatory Visit (HOSPITAL_COMMUNITY)
Admission: RE | Admit: 2013-05-04 | Discharge: 2013-05-04 | Disposition: A | Discharge status: Home / Self Care | Payer: BC Managed Care – PPO | Source: Ambulatory Visit | Attending: Internal Medicine | Admitting: Internal Medicine

## 2013-05-04 DIAGNOSIS — R05 Cough: Secondary | ICD-10-CM

## 2013-05-04 DIAGNOSIS — F17200 Nicotine dependence, unspecified, uncomplicated: Secondary | ICD-10-CM | POA: Insufficient documentation

## 2013-05-04 DIAGNOSIS — J984 Other disorders of lung: Secondary | ICD-10-CM | POA: Insufficient documentation

## 2013-05-04 DIAGNOSIS — R911 Solitary pulmonary nodule: Secondary | ICD-10-CM

## 2013-05-04 DIAGNOSIS — R059 Cough, unspecified: Secondary | ICD-10-CM | POA: Insufficient documentation

## 2013-05-07 ENCOUNTER — Ambulatory Visit (HOSPITAL_COMMUNITY): Payer: BC Managed Care – PPO

## 2013-05-10 ENCOUNTER — Ambulatory Visit (HOSPITAL_COMMUNITY)
Admission: RE | Admit: 2013-05-10 | Discharge: 2013-05-10 | Disposition: A | Discharge status: Home / Self Care | Payer: BC Managed Care – PPO | Source: Ambulatory Visit | Attending: Internal Medicine | Admitting: Internal Medicine

## 2013-05-10 DIAGNOSIS — R911 Solitary pulmonary nodule: Secondary | ICD-10-CM

## 2013-06-04 ENCOUNTER — Other Ambulatory Visit (HOSPITAL_COMMUNITY): Payer: Self-pay | Admitting: Family Medicine

## 2013-06-04 DIAGNOSIS — Z139 Encounter for screening, unspecified: Secondary | ICD-10-CM

## 2013-06-11 ENCOUNTER — Ambulatory Visit (HOSPITAL_COMMUNITY)
Admission: RE | Admit: 2013-06-11 | Discharge: 2013-06-11 | Disposition: A | Discharge status: Home / Self Care | Payer: BC Managed Care – PPO | Source: Ambulatory Visit | Attending: Family Medicine | Admitting: Family Medicine

## 2013-06-11 DIAGNOSIS — Z139 Encounter for screening, unspecified: Secondary | ICD-10-CM

## 2013-06-11 DIAGNOSIS — Z1231 Encounter for screening mammogram for malignant neoplasm of breast: Secondary | ICD-10-CM | POA: Insufficient documentation

## 2013-08-26 ENCOUNTER — Encounter (HOSPITAL_COMMUNITY): Payer: Self-pay | Admitting: Emergency Medicine

## 2013-08-26 ENCOUNTER — Emergency Department (HOSPITAL_COMMUNITY)
Admission: EM | Admit: 2013-08-26 | Discharge: 2013-08-26 | Disposition: A | Discharge status: Home / Self Care | Payer: BC Managed Care – PPO | Attending: Emergency Medicine | Admitting: Emergency Medicine

## 2013-08-26 DIAGNOSIS — R6883 Chills (without fever): Secondary | ICD-10-CM | POA: Insufficient documentation

## 2013-08-26 DIAGNOSIS — J329 Chronic sinusitis, unspecified: Secondary | ICD-10-CM

## 2013-08-26 DIAGNOSIS — F17200 Nicotine dependence, unspecified, uncomplicated: Secondary | ICD-10-CM | POA: Insufficient documentation

## 2013-08-26 DIAGNOSIS — R519 Headache, unspecified: Secondary | ICD-10-CM

## 2013-08-26 DIAGNOSIS — R51 Headache: Secondary | ICD-10-CM | POA: Insufficient documentation

## 2013-08-26 DIAGNOSIS — R5381 Other malaise: Secondary | ICD-10-CM | POA: Insufficient documentation

## 2013-08-26 DIAGNOSIS — Z79899 Other long term (current) drug therapy: Secondary | ICD-10-CM | POA: Insufficient documentation

## 2013-08-26 DIAGNOSIS — Z792 Long term (current) use of antibiotics: Secondary | ICD-10-CM | POA: Insufficient documentation

## 2013-08-26 DIAGNOSIS — R5383 Other fatigue: Secondary | ICD-10-CM

## 2013-08-26 MED ORDER — SODIUM CHLORIDE 0.9 % IV BOLUS (SEPSIS)
1000.0000 mL | Freq: Once | INTRAVENOUS | Status: AC
  Administered 2013-08-26: 1000 mL via INTRAVENOUS

## 2013-08-26 MED ORDER — PROMETHAZINE HCL 25 MG PO TABS: 25.0000 mg | ORAL_TABLET | Freq: Four times a day (QID) | ORAL | Status: DC | PRN

## 2013-08-26 MED ORDER — AMOXICILLIN-POT CLAVULANATE 875-125 MG PO TABS: 1.0000 | ORAL_TABLET | Freq: Two times a day (BID) | ORAL | Status: DC

## 2013-08-26 MED ORDER — METOCLOPRAMIDE HCL 5 MG/ML IJ SOLN
10.0000 mg | Freq: Once | INTRAMUSCULAR | Status: AC
  Administered 2013-08-26: 10 mg via INTRAVENOUS
  Filled 2013-08-26: qty 2

## 2013-08-26 MED ORDER — ONDANSETRON HCL 4 MG/2ML IJ SOLN
4.0000 mg | Freq: Once | INTRAMUSCULAR | Status: AC
  Administered 2013-08-26: 4 mg via INTRAVENOUS
  Filled 2013-08-26: qty 2

## 2013-08-26 MED ORDER — TRAMADOL HCL 50 MG PO TABS
50.0000 mg | ORAL_TABLET | Freq: Four times a day (QID) | ORAL | Status: DC | PRN
Start: 2013-08-26 — End: 2013-08-28

## 2013-08-26 MED ORDER — KETOROLAC TROMETHAMINE 30 MG/ML IJ SOLN
30.0000 mg | Freq: Once | INTRAMUSCULAR | Status: AC
Start: 2013-08-26 — End: 2013-08-26
  Administered 2013-08-26: 30 mg via INTRAVENOUS
  Filled 2013-08-26: qty 1

## 2013-08-26 MED ORDER — DIPHENHYDRAMINE HCL 50 MG/ML IJ SOLN
25.0000 mg | Freq: Once | INTRAMUSCULAR | Status: AC
  Administered 2013-08-26: 25 mg via INTRAVENOUS
  Filled 2013-08-26: qty 1

## 2013-08-26 NOTE — ED Notes (Addendum)
Patient complaining of weakness and body aches starting today. States she took Theraflu approximately 1 hour ago with no relief. Denies n/v/d.

## 2013-08-26 NOTE — ED Provider Notes (Addendum)
CSN: [id number]     Arrival date & time 08/26/13  1904 History   First MD Initiated Contact with Patient 08/26/13 1922     Chief Complaint  Patient presents with  . Generalized Body Aches  . Weakness   (Consider location/radiation/quality/duration/timing/severity/associated sxs/prior Treatment) HPI.... frontal headache since Tuesday. Patient has history of sinus infection. Additionally she complains of body aches, chills.   She took over-the-counter products with minimal relief.  No stiff neck, neuro deficits.  Severity is moderate.  History reviewed. No pertinent past medical history. Past Surgical History  Procedure Laterality Date  . Abdominal hysterectomy    . Splenectomy     History reviewed. No pertinent family history. History  Substance Use Topics  . Smoking status: Current Every Day Smoker    Types: Cigarettes  . Smokeless tobacco: Not on file  . Alcohol Use: No   OB History   Grav Para Term Preterm Abortions TAB SAB Ect Mult Living                 Review of Systems  All other systems reviewed and are negative.    Allergies  Aspirin; Codeine; Hydrocodone; Oxycodone-acetaminophen; and Tape  Home Medications   Current Outpatient Rx  Name  Route  Sig  Dispense  Refill  . amoxicillin (AMOXIL) 500 MG capsule   Oral   Take 1 capsule (500 mg total) by mouth 3 (three) times daily.   30 capsule   0   . amoxicillin-clavulanate (AUGMENTIN) 875-125 MG per tablet   Oral   Take 1 tablet by mouth 2 (two) times daily.   20 tablet   0   . hydrochlorothiazide (HYDRODIURIL) 25 MG tablet   Oral   Take 25 mg by mouth every morning.         . promethazine (PHENERGAN) 25 MG tablet   Oral   Take 1 tablet (25 mg total) by mouth every 6 (six) hours as needed for nausea or vomiting.   15 tablet   0   . traMADol (ULTRAM) 50 MG tablet   Oral   Take 1 tablet (50 mg total) by mouth every 6 (six) hours as needed for pain.   20 tablet   0   . traMADol (ULTRAM) 50 MG  tablet   Oral   Take 1 tablet (50 mg total) by mouth every 6 (six) hours as needed for pain.   20 tablet   0   . traMADol (ULTRAM) 50 MG tablet   Oral   Take 1 tablet (50 mg total) by mouth every 6 (six) hours as needed.   15 tablet   0    BP 124/76  Pulse 109  Temp(Src) 98.8 F (37.1 C) (Oral)  Resp 20  Ht 5\' 5"  (1.651 m)  Wt 176 lb (79.833 kg)  BMI 29.29 kg/m2  SpO2 97% Physical Exam  Nursing note and vitals reviewed. Constitutional: She is oriented to person, place, and time. She appears well-developed and well-nourished.  HENT:  Head: Normocephalic and atraumatic.  Tender over bilateral frontal sinus'  Eyes: Conjunctivae and EOM are normal. Pupils are equal, round, and reactive to light.  Neck: Normal range of motion. Neck supple.  Cardiovascular: Normal rate, regular rhythm and normal heart sounds.   Pulmonary/Chest: Effort normal and breath sounds normal.  Abdominal: Soft. Bowel sounds are normal.  Musculoskeletal: Normal range of motion.  Neurological: She is alert and oriented to person, place, and time.  Skin: Skin is warm and dry.  Psychiatric: She has a normal mood and affect. Her behavior is normal.    ED Course  Procedures (including critical care time) Labs Review Labs Reviewed - No data to display Imaging Review No results found.  EKG Interpretation   None       MDM  No diagnosis found. Patient feels much better after IV fluids, IV Toradol, IV Reglan, IV Benadryl. Discharge medications Augmentin 875 twice a day for sinusitis, Ultram for pain, Phenergan 25 mg for nausea.    Nat Christen, MD 08/26/13 2114  Nat Christen, MD 08/28/13 [phone]

## 2013-08-26 NOTE — ED Notes (Signed)
C/o vomiting x1. Dr. Lacinda Axon aware. New order received.

## 2013-08-26 NOTE — Discharge Instructions (Signed)
Prescription for antibiotics, pain medicine, nausea medicine. Increase fluids. Return if worse

## 2013-08-26 NOTE — ED Notes (Signed)
nad noted prior to dc. Dc instructions reviewed  And explained. Voiced understanding. 3 script given. Voiced understanding. No active vomiting at this time. Pt wishes to leave.

## 2013-08-27 ENCOUNTER — Emergency Department (HOSPITAL_COMMUNITY)
Admission: EM | Admit: 2013-08-27 | Discharge: 2013-08-28 | Disposition: A | Discharge status: Home / Self Care | Payer: BC Managed Care – PPO | Attending: Emergency Medicine | Admitting: Emergency Medicine

## 2013-08-27 ENCOUNTER — Emergency Department (HOSPITAL_COMMUNITY): Payer: BC Managed Care – PPO

## 2013-08-27 ENCOUNTER — Encounter (HOSPITAL_COMMUNITY): Payer: Self-pay | Admitting: Emergency Medicine

## 2013-08-27 DIAGNOSIS — Z792 Long term (current) use of antibiotics: Secondary | ICD-10-CM | POA: Insufficient documentation

## 2013-08-27 DIAGNOSIS — J111 Influenza due to unidentified influenza virus with other respiratory manifestations: Secondary | ICD-10-CM | POA: Insufficient documentation

## 2013-08-27 DIAGNOSIS — R1110 Vomiting, unspecified: Secondary | ICD-10-CM | POA: Insufficient documentation

## 2013-08-27 DIAGNOSIS — IMO0002 Reserved for concepts with insufficient information to code with codable children: Secondary | ICD-10-CM | POA: Insufficient documentation

## 2013-08-27 DIAGNOSIS — I10 Essential (primary) hypertension: Secondary | ICD-10-CM | POA: Insufficient documentation

## 2013-08-27 DIAGNOSIS — E119 Type 2 diabetes mellitus without complications: Secondary | ICD-10-CM | POA: Insufficient documentation

## 2013-08-27 DIAGNOSIS — F17200 Nicotine dependence, unspecified, uncomplicated: Secondary | ICD-10-CM | POA: Insufficient documentation

## 2013-08-27 LAB — BASIC METABOLIC PANEL
BUN: 9 mg/dL (ref 6–23)
CHLORIDE: 102 meq/L (ref 96–112)
CO2: 24 meq/L (ref 19–32)
CREATININE: 0.52 mg/dL (ref 0.50–1.10)
Calcium: 8.7 mg/dL (ref 8.4–10.5)
GFR calc non Af Amer: >90 mL/min (ref >90)
Glucose, Bld: 136 mg/dL — ABNORMAL HIGH (ref 70–99)
POTASSIUM: 3.9 meq/L (ref 3.7–5.3)
Sodium: 140 mEq/L (ref 137–147)

## 2013-08-27 LAB — CBC WITH DIFFERENTIAL/PLATELET
BASOS PCT: 0 % (ref 0–1)
Basophils Absolute: 0 10*3/uL (ref 0.0–0.1)
Eosinophils Absolute: 0 10*3/uL (ref 0.0–0.7)
Eosinophils Relative: 0 % (ref 0–5)
HEMATOCRIT: 38.2 % (ref 36.0–46.0)
HEMOGLOBIN: 13.5 g/dL (ref 12.0–15.0)
LYMPHS ABS: 1.5 10*3/uL (ref 0.7–4.0)
LYMPHS PCT: 13 % (ref 12–46)
MCH: 30.3 pg (ref 26.0–34.0)
MCHC: 35.3 g/dL (ref 30.0–36.0)
MCV: 85.8 fL (ref 78.0–100.0)
MONO ABS: 1.1 10*3/uL — AB (ref 0.1–1.0)
MONOS PCT: 10 % (ref 3–12)
NEUTROS ABS: 8.6 10*3/uL — AB (ref 1.7–7.7)
NEUTROS PCT: 77 % (ref 43–77)
Platelets: 290 10*3/uL (ref 150–400)
RBC: 4.45 MIL/uL (ref 3.87–5.11)
RDW: 14 % (ref 11.5–15.5)
WBC: 11.1 10*3/uL — AB (ref 4.0–10.5)

## 2013-08-27 MED ORDER — OSELTAMIVIR PHOSPHATE 75 MG PO CAPS
75.0000 mg | ORAL_CAPSULE | Freq: Once | ORAL | Status: AC
  Administered 2013-08-27: 75 mg via ORAL
  Filled 2013-08-27: qty 1

## 2013-08-27 MED ORDER — ONDANSETRON HCL 4 MG/2ML IJ SOLN
4.0000 mg | Freq: Once | INTRAMUSCULAR | Status: AC
  Administered 2013-08-27: 4 mg via INTRAVENOUS
  Filled 2013-08-27: qty 2

## 2013-08-27 MED ORDER — SODIUM CHLORIDE 0.9 % IV SOLN: INTRAVENOUS | Status: DC

## 2013-08-27 MED ORDER — HYDROMORPHONE HCL PF 1 MG/ML IJ SOLN
1.0000 mg | Freq: Once | INTRAMUSCULAR | Status: AC
  Administered 2013-08-27: 1 mg via INTRAVENOUS
  Filled 2013-08-27: qty 1

## 2013-08-27 MED ORDER — SODIUM CHLORIDE 0.9 % IV BOLUS (SEPSIS)
1000.0000 mL | Freq: Once | INTRAVENOUS | Status: AC
  Administered 2013-08-27: 1000 mL via INTRAVENOUS

## 2013-08-27 NOTE — ED Provider Notes (Signed)
CSN: [id number]     Arrival date & time 08/27/13  1625 History  This chart was scribed for Richarda Blade, MD by Zettie Pho, ED Scribe. This patient was seen in room APA19/APA19 and the patient's care was started at 9:26 PM.    Chief Complaint  Patient presents with  . Generalized Body Aches  . Emesis   The history is provided by the patient. No language interpreter was used.   HPI Comments: Jacqueline Haney is a 58 y.o. female who presents to the Emergency Department complaining of diffuse myalgias with associated sinus pressure, approximately 2 episodes of emesis, subjective fever (99.8 measured in the ED), and dry cough onset 2 days ago. Patient was seen here yesterday for these complaints and discharged with Augmentin to treat sinusitis, Ultram, and Phenergan to manage symptoms, but states that these medications have not been able to provide relief. She states these symptoms are new for her. 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 Patient has a history of DM and HTN.   History reviewed. No pertinent past medical history. Past Surgical History  Procedure Laterality Date  . Abdominal hysterectomy    . Splenectomy     No family history on file. History  Substance Use Topics  . Smoking status: Current Every Day Smoker    Types: Cigarettes  . Smokeless tobacco: Not on file  . Alcohol Use: No   OB History   Grav Para Term Preterm Abortions TAB SAB Ect Mult Living                 Review of Systems  Constitutional: Positive for fever.  HENT: Positive for sinus pressure.   Respiratory: Positive for cough.   Gastrointestinal: Positive for vomiting.  Musculoskeletal: Positive for myalgias.  All other systems reviewed and are negative.    Allergies  Aspirin; Codeine; Hydrocodone; Oxycodone-acetaminophen; and Tape  Home Medications   Current Outpatient Rx  Name  Route  Sig  Dispense  Refill  .  amoxicillin-clavulanate (AUGMENTIN) 875-125 MG per tablet   Oral   Take 1 tablet by mouth 2 (two) times daily.   20 tablet   0   . fluticasone (FLONASE) 50 MCG/ACT nasal spray   Each Nare   Place 1 spray into both nostrils daily.         . promethazine (PHENERGAN) 25 MG tablet   Oral   Take 1 tablet (25 mg total) by mouth every 6 (six) hours as needed for nausea or vomiting.   15 tablet   0   . oxyCODONE-acetaminophen (PERCOCET) 5-325 MG per tablet   Oral   Take 1 tablet by mouth every 4 (four) hours as needed for severe pain.   20 tablet   0    Triage Vitals: BP 124/68  Pulse 85  Temp(Src) 99.8 F (37.7 C) (Oral)  Resp 20  Wt 176 lb (79.833 kg)  SpO2 95%  Physical Exam  Nursing note and vitals reviewed. Constitutional: She is oriented to person, place, and time. She appears well-developed and well-nourished.  HENT:  Head: Normocephalic and atraumatic.  Eyes: Conjunctivae and EOM are normal. Pupils are equal, round, and reactive to light.  Neck: Normal range of motion and phonation normal. Neck supple.  No meningismus.   Cardiovascular: Normal rate, regular rhythm and intact distal pulses.   Pulmonary/Chest: Effort normal and breath sounds normal. She exhibits no tenderness.  Abdominal: Soft. She exhibits no distension. There is no tenderness. There is no guarding.  Musculoskeletal: Normal range of motion.  Neurological: She is alert and oriented to person, place, and time. She exhibits normal muscle tone.  Skin: Skin is warm and dry.  Psychiatric: She has a normal mood and affect. Her behavior is normal. Judgment and thought content normal.    ED Course  Procedures (including critical care time)  DIAGNOSTIC STUDIES: Oxygen Saturation is 95% on room air, adequate by my interpretation.    COORDINATION OF CARE: 9:28 PM- Will order IV fluids, tamiflu, Dilaudid, and Zofran to manage symptoms. Will order blood labs. Discussed treatment plan with patient at bedside  and patient verbalized agreement.    12:05 AM Reevaluation with update and discussion. After initial assessment and treatment, an updated evaluation reveals  she feels better now . [name] L   Medications  0.9 %  sodium chloride infusion (not administered)  sodium chloride 0.9 % bolus 1,000 mL (1,000 mLs Intravenous New Bag/Given 08/27/13 2144)  HYDROmorphone (DILAUDID) injection 1 mg (1 mg Intravenous Given 08/27/13 2144)  ondansetron (ZOFRAN) injection 4 mg (4 mg Intravenous Given 08/27/13 2144)  oseltamivir (TAMIFLU) capsule 75 mg (75 mg Oral Given 08/27/13 2144)  ondansetron (ZOFRAN) injection 4 mg (4 mg Intravenous Given 08/27/13 2355)   Results for orders placed during the hospital encounter of 08/27/13  CBC WITH DIFFERENTIAL      Result Value Range   WBC 11.1 (*) 4.0 - 10.5 K/uL   RBC 4.45  3.87 - 5.11 MIL/uL   Hemoglobin 13.5  12.0 - 15.0 g/dL   HCT 38.2  36.0 - 46.0 %   MCV 85.8  78.0 - 100.0 fL   MCH 30.3  26.0 - 34.0 pg   MCHC 35.3  30.0 - 36.0 g/dL   RDW 14.0  11.5 - 15.5 %   Platelets 290  150 - 400 K/uL   Neutrophils Relative % 77  43 - 77 %   Neutro Abs 8.6 (*) 1.7 - 7.7 K/uL   Lymphocytes Relative 13  12 - 46 %   Lymphs Abs 1.5  0.7 - 4.0 K/uL   Monocytes Relative 10  3 - 12 %   Monocytes Absolute 1.1 (*) 0.1 - 1.0 K/uL   Eosinophils Relative 0  0 - 5 %   Eosinophils Absolute 0.0  0.0 - 0.7 K/uL   Basophils Relative 0  0 - 1 %   Basophils Absolute 0.0  0.0 - 0.1 K/uL  BASIC METABOLIC PANEL      Result Value Range   Sodium 140  137 - 147 mEq/L   Potassium 3.9  3.7 - 5.3 mEq/L   Chloride 102  96 - 112 mEq/L   CO2 24  19 - 32 mEq/L   Glucose, Bld 136 (*) 70 - 99 mg/dL   BUN 9  6 - 23 mg/dL   Creatinine, Ser 0.52  0.50 - 1.10 mg/dL   Calcium 8.7  8.4 - 10.5 mg/dL   GFR calc non Af Amer >90  >90 mL/min   GFR calc Af Amer >90  >90 mL/min     EKG Interpretation   None       MDM   1. Influenza    Evaluation is consistent with influenza. She was  previously diagnosed with bacterial sinusitis. Her symptoms are somewhat nonspecific. She's improved with treatment in the emergency department.  Nursing Notes Reviewed/ Care Coordinated Applicable Imaging Reviewed Interpretation of Laboratory Data incorporated  into ED treatment  The patient appears reasonably screened and/or stabilized for discharge and I doubt any other medical condition or other [facility] requiring further screening, evaluation, or treatment in the ED at this time prior to discharge.  Plan: Home Medications- Tamiflu, Percocet; Home Treatments- Rest, fluids; return here if the recommended treatment, does not improve the symptoms; Recommended follow up- PCP prn  I personally performed the services described in this documentation, which was scribed in my presence. The recorded information has been reviewed and is accurate.      Richarda Blade, MD 08/28/13 [phone]

## 2013-08-27 NOTE — ED Notes (Signed)
Patient vomiting in room at this time.

## 2013-08-27 NOTE — ED Notes (Signed)
Pt states continued vomiting and body aches since yesterday's visit. States medications have not helped. No active vomiting since arrival.

## 2013-08-27 NOTE — ED Notes (Signed)
Gave patient sprite as requested and approved by MD.

## 2013-08-28 MED ORDER — OSELTAMIVIR PHOSPHATE 75 MG PO CAPS: 75.0000 mg | ORAL_CAPSULE | Freq: Two times a day (BID) | ORAL | Status: DC

## 2013-08-28 MED ORDER — OXYCODONE-ACETAMINOPHEN 5-325 MG PO TABS: 1.0000 | ORAL_TABLET | ORAL | Status: DC | PRN

## 2013-08-28 MED ORDER — ONDANSETRON 8 MG PO TBDP
8.0000 mg | ORAL_TABLET | Freq: Once | ORAL | Status: AC
  Administered 2013-08-28: 8 mg via ORAL
  Filled 2013-08-28: qty 1

## 2013-08-28 NOTE — ED Notes (Signed)
Patient getting dressed to go home and started vomiting. MD aware.

## 2013-08-28 NOTE — Discharge Instructions (Signed)

## 2014-03-28 ENCOUNTER — Emergency Department (HOSPITAL_COMMUNITY)
Admission: EM | Admit: 2014-03-28 | Discharge: 2014-03-28 | Disposition: A | Discharge status: Home / Self Care | Payer: Worker's Compensation | Attending: Emergency Medicine | Admitting: Emergency Medicine

## 2014-03-28 ENCOUNTER — Emergency Department (HOSPITAL_COMMUNITY): Payer: Worker's Compensation

## 2014-03-28 ENCOUNTER — Encounter (HOSPITAL_COMMUNITY): Payer: Self-pay | Admitting: Emergency Medicine

## 2014-03-28 DIAGNOSIS — S63509A Unspecified sprain of unspecified wrist, initial encounter: Secondary | ICD-10-CM | POA: Diagnosis not present

## 2014-03-28 DIAGNOSIS — Y9389 Activity, other specified: Secondary | ICD-10-CM | POA: Diagnosis not present

## 2014-03-28 DIAGNOSIS — S63501A Unspecified sprain of right wrist, initial encounter: Secondary | ICD-10-CM

## 2014-03-28 DIAGNOSIS — Y929 Unspecified place or not applicable: Secondary | ICD-10-CM | POA: Insufficient documentation

## 2014-03-28 DIAGNOSIS — W010XXA Fall on same level from slipping, tripping and stumbling without subsequent striking against object, initial encounter: Secondary | ICD-10-CM | POA: Insufficient documentation

## 2014-03-28 DIAGNOSIS — S59909A Unspecified injury of unspecified elbow, initial encounter: Secondary | ICD-10-CM | POA: Diagnosis present

## 2014-03-28 DIAGNOSIS — F17200 Nicotine dependence, unspecified, uncomplicated: Secondary | ICD-10-CM | POA: Insufficient documentation

## 2014-03-28 DIAGNOSIS — S59919A Unspecified injury of unspecified forearm, initial encounter: Secondary | ICD-10-CM | POA: Diagnosis present

## 2014-03-28 DIAGNOSIS — S6990XA Unspecified injury of unspecified wrist, hand and finger(s), initial encounter: Secondary | ICD-10-CM

## 2014-03-28 MED ORDER — TRAMADOL HCL 50 MG PO TABS: 50.0000 mg | ORAL_TABLET | Freq: Four times a day (QID) | ORAL | Status: DC | PRN

## 2014-03-28 NOTE — ED Notes (Signed)
Pt was walking at work when she slipped on floor while someone was waxing the floor. EMS reports slight deformity to rt wrist. Splint applied by EMS. Pt also left hip pain as well. Pt did not hit head or any loc. Ambulated in from ambulance without difficulty.

## 2014-03-28 NOTE — ED Notes (Signed)
Pt c/o pain to rt hand to rt elbow.

## 2014-03-28 NOTE — ED Provider Notes (Signed)
CSN: [id number]     Arrival date & time 03/28/14  1458 History   First MD Initiated Contact with Patient 03/28/14 1521     Chief Complaint  Patient presents with  . Arm Pain     (Consider location/radiation/quality/duration/timing/severity/associated sxs/prior Treatment) HPI Comments: Patient presents to the ER for evaluation of right wrist injury. Patient slipped on a freshly waxed the [place] on outstretched arm. Patient complained of pain, swelling of the wrist medially after the injury. No other injury. No loss of consciousness.  Patient is a 58 y.o. female presenting with arm pain.  Arm Pain    History reviewed. No pertinent past medical history. Past Surgical History  Procedure Laterality Date  . Abdominal hysterectomy    . Splenectomy     No family history on file. History  Substance Use Topics  . Smoking status: Current Every Day Smoker    Types: Cigarettes  . Smokeless tobacco: Not on file  . Alcohol Use: No   OB History   Grav Para Term Preterm Abortions TAB SAB Ect Mult Living                 Review of Systems  Musculoskeletal: Positive for arthralgias.  All other systems reviewed and are negative.     Allergies  Aspirin; Codeine; Hydrocodone; Oxycodone-acetaminophen; and Tape  Home Medications   Prior to Admission medications   Not on File   BP 125/85  Pulse 74  Temp(Src) 97.8 F (36.6 C) (Oral)  Resp 16  Wt 176 lb (79.833 kg)  SpO2 96% Physical Exam  Constitutional: She is oriented to person, place, and time. She appears well-developed and well-nourished. No distress.  HENT:  Head: Normocephalic and atraumatic.  Right Ear: Hearing normal.  Left Ear: Hearing normal.  Nose: Nose normal.  Mouth/Throat: Oropharynx is clear and moist and mucous membranes are normal.  Eyes: Conjunctivae and EOM are normal. Pupils are equal, round, and reactive to light.  Neck: Normal range of motion. Neck supple. No spinous process tenderness and no muscular  tenderness present.  Cardiovascular: Regular rhythm, S1 normal and S2 normal.  Exam reveals no gallop and no friction rub.   No murmur heard. Pulmonary/Chest: Effort normal and breath sounds normal. No respiratory distress. She exhibits no tenderness.  Abdominal: Soft. Normal appearance and bowel sounds are normal. There is no hepatosplenomegaly. There is no tenderness. There is no rebound, no guarding, no tenderness at McBurney's point and negative Delrosario's sign. No hernia.  Musculoskeletal: Normal range of motion.       Right wrist: She exhibits tenderness and swelling. She exhibits no bony tenderness and no deformity.  Neurological: She is alert and oriented to person, place, and time. She has normal strength. No cranial nerve deficit or sensory deficit. Coordination normal. GCS eye subscore is 4. GCS verbal subscore is 5. GCS motor subscore is 6.  Skin: Skin is warm, dry and intact. No rash noted. No cyanosis.  Psychiatric: She has a normal mood and affect. Her speech is normal and behavior is normal. Thought content normal.    ED Course  Procedures (including critical care time) Labs Review Labs Reviewed - No data to display  Imaging Review Dg Forearm Right  03/28/2014   CLINICAL DATA:  Arm pain status post fall.  EXAM: RIGHT FOREARM - 2 VIEW  COMPARISON:  Right wrist of today's date  FINDINGS: The radius and ulna are adequately mineralized. There is no acute fracture nor dislocation. The observed portions of the elbow  and wrist are unremarkable. The soft tissues exhibit no foreign bodies.  IMPRESSION: There is no acute bony abnormality of the right radius or ulna.   Electronically Signed   By: David  Martinique   On: 03/28/2014 15:25   Dg Wrist Complete Right  03/28/2014   CLINICAL DATA:  Pain post trauma  EXAM: RIGHT WRIST - COMPLETE 3+ VIEW  COMPARISON:  None.  FINDINGS: Frontal, oblique, lateral, and ulnar deviation scaphoid images were obtained. There is narrowing in the radiocarpal joint  region. No fracture or dislocation. No erosive change.  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 No fracture or dislocation.   Electronically Signed   By: Lowella Grip M.D.   On: 03/28/2014 15:23   Dg Hand Complete Right  03/28/2014   CLINICAL DATA:  Right hand pain status post fall  EXAM: RIGHT HAND - COMPLETE 3+ VIEW  COMPARISON:  Right wrist series of today's date  FINDINGS: The bones are adequately mineralized. There are mild degenerative interphalangeal and metacarpophalangeal joint changes at multiple sites. There is no acute metacarpal or phalanges will fracture. The carpal bones are intact. The radiocarpal and ulnocarpal joints exhibit no significant abnormality. The soft tissues are unremarkable.  IMPRESSION: There is no acute bony abnormality of the right hand.   Electronically Signed   By: David  Martinique   On: 03/28/2014 15:26     EKG Interpretation None      MDM   Final diagnoses:  None    Patient presents to ER for evaluation of right wrist injury after a fall. X-rays are negative. Patient reports that the pain and swelling are improving without intervention. She will be placed in Ace wrap, ice the area. Analgesia as needed.    Orpah Greek, MD 03/28/14 [phone]

## 2014-03-28 NOTE — ED Notes (Signed)
nad noted prior to dc. Dc instructions reviewed and explained. Voiced understanding. Ambulated out without difficulty.

## 2014-03-28 NOTE — Discharge Instructions (Signed)
Cryotherapy °Cryotherapy means treatment with cold. Ice or gel packs can be used to reduce both pain and swelling. Ice is the most helpful within the first 24 to 48 hours after an injury or flare-up from overusing a muscle or joint. Sprains, strains, spasms, burning pain, shooting pain, and aches can all be eased with ice. Ice can also be used when recovering from surgery. Ice is effective, has very few side effects, and is safe for most people to use. °PRECAUTIONS  °Ice is not a safe treatment option for people with: °· Raynaud phenomenon. This is a condition affecting small blood vessels in the extremities. Exposure to cold may cause your problems to return. °· Cold hypersensitivity. There are many forms of cold hypersensitivity, including: °¨ Cold urticaria. Red, itchy hives appear on the skin when the tissues begin to warm after being iced. °¨ Cold erythema. This is a red, itchy rash caused by exposure to cold. °¨ Cold hemoglobinuria. Red blood cells break down when the tissues begin to warm after being iced. The hemoglobin that carry oxygen are passed into the urine because they cannot combine with blood proteins fast enough. °· Numbness or altered sensitivity in the area being iced. °If you have any of the following conditions, do not use ice until you have discussed cryotherapy with your caregiver: °· Heart conditions, such as arrhythmia, angina, or chronic heart disease. °· High blood pressure. °· Healing wounds or open skin in the area being iced. °· Current infections. °· Rheumatoid arthritis. °· Poor circulation. °· Diabetes. °Ice slows the blood flow in the region it is applied. This is beneficial when trying to stop inflamed tissues from spreading irritating chemicals to surrounding tissues. However, if you expose your skin to cold temperatures for too long or without the proper protection, you can damage your skin or nerves. Watch for signs of skin damage due to cold. °HOME CARE INSTRUCTIONS °Follow  these tips to use ice and cold packs safely. °· Place a dry or damp towel between the ice and skin. A damp towel will cool the skin more quickly, so you may need to shorten the time that the ice is used. °· For a more rapid response, add gentle compression to the ice. °· Ice for no more than 10 to 20 minutes at a time. The bonier the area you are icing, the less time it will take to get the benefits of ice. °· Check your skin after 5 minutes to make sure there are no signs of a poor response to cold or skin damage. °· Rest 20 minutes or more between uses. °· Once your skin is numb, you can end your treatment. You can test numbness by very lightly touching your skin. The touch should be so light that you do not see the skin dimple from the pressure of your fingertip. When using ice, most people will feel these normal sensations in this order: cold, burning, aching, and numbness. °· Do not use ice on someone who cannot communicate their responses to pain, such as small children or people with dementia. °HOW TO MAKE AN ICE PACK °Ice packs are the most common way to use ice therapy. Other methods include ice massage, ice baths, and cryosprays. Muscle creams that cause a cold, tingly feeling do not offer the same benefits that ice offers and should not be used as a substitute unless recommended by your caregiver. °To make an ice pack, do one of the following: °· Place crushed ice or a   bag of frozen vegetables in a sealable plastic bag. Squeeze out the excess air. Place this bag inside another plastic bag. Slide the bag into a pillowcase or place a damp towel between your skin and the bag.  Mix 3 parts water with 1 part rubbing alcohol. Freeze the mixture in a sealable plastic bag. When you remove the mixture from the freezer, it will be slushy. Squeeze out the excess air. Place this bag inside another plastic bag. Slide the bag into a pillowcase or place a damp towel between your skin and the bag. SEEK MEDICAL CARE  IF:  You develop white spots on your skin. This may give the skin a blotchy (mottled) appearance.  Your skin turns blue or pale.  Your skin becomes waxy or hard.  Your swelling gets worse. MAKE SURE YOU:   Understand these instructions.  Will watch your condition.  Will get help right away if you are not doing well or get worse. Document Released: 03/29/2011 Document Revised: 12/17/2013 Document Reviewed: 03/29/2011 [facility] Patient Information 2015 [place], [place]. This information is not intended to replace advice given to you by your health care provider. Make sure you discuss any questions you have with your health care provider.  Sprain A sprain is a tear in one of the strong, fibrous tissues that connect your bones (ligaments). The severity of the sprain depends on how much of the ligament is torn. The tear can be either partial or complete. CAUSES  Often, sprains are a result of a fall or an injury. The force of the impact causes the fibers of your ligament to stretch beyond their normal length. This excess tension causes the fibers of your ligament to tear. SYMPTOMS  You may have some loss of motion or increased pain within your normal range of motion. Other symptoms include:  Bruising.  Tenderness.  Swelling. DIAGNOSIS  In order to diagnose a sprain, your caregiver will physically examine you to determine how torn the ligament is. Your caregiver may also suggest an X-ray exam to make sure no bones are broken. TREATMENT  If your ligament is only partially torn, treatment usually involves keeping the injured area in a fixed position (immobilization) for a short period. To do this, your caregiver will apply a bandage, cast, or splint to keep the area from moving until it heals. For a partially torn ligament, the healing process usually takes 2 to 3 weeks. If your ligament is completely torn, you may need surgery to reconnect the ligament to the bone or to reconstruct the  ligament. After surgery, a cast or splint may be applied and will need to stay on for 4 to 6 weeks while your ligament heals. HOME CARE INSTRUCTIONS  Keep the injured area elevated to decrease swelling.  To ease pain and swelling, apply ice to your joint twice a day, for 2 to 3 days.  Put ice in a plastic bag.  Place a towel between your skin and the bag.  Leave the ice on for 15 minutes.  Only take over-the-counter or prescription medicine for pain as directed by your caregiver.  Do not leave the injured area unprotected until pain and stiffness go away (usually 3 to 4 weeks).  Do not allow your cast or splint to get wet. Cover your cast or splint with a plastic bag when you shower or bathe. Do not swim.  Your caregiver may suggest exercises for you to do during your recovery to prevent or limit permanent stiffness. SEEK IMMEDIATE  MEDICAL CARE IF:  Your cast or splint becomes damaged.  Your pain becomes worse. MAKE SURE YOU:  Understand these instructions.  Will watch your condition.  Will get help right away if you are not doing well or get worse. Document Released: 07/30/2000 Document Revised: 10/25/2011 Document Reviewed: 08/14/2011 [facility] Patient Information 2015 [place], [place]. This information is not intended to replace advice given to you by your health care provider. Make sure you discuss any questions you have with your health care provider.

## 2014-07-05 ENCOUNTER — Emergency Department (HOSPITAL_COMMUNITY)
Admission: EM | Admit: 2014-07-05 | Discharge: 2014-07-05 | Disposition: A | Discharge status: Home / Self Care | Payer: BC Managed Care – PPO | Attending: Emergency Medicine | Admitting: Emergency Medicine

## 2014-07-05 ENCOUNTER — Encounter (HOSPITAL_COMMUNITY): Payer: Self-pay | Admitting: *Deleted

## 2014-07-05 DIAGNOSIS — L239 Allergic contact dermatitis, unspecified cause: Secondary | ICD-10-CM | POA: Insufficient documentation

## 2014-07-05 DIAGNOSIS — Z720 Tobacco use: Secondary | ICD-10-CM | POA: Diagnosis not present

## 2014-07-05 DIAGNOSIS — R21 Rash and other nonspecific skin eruption: Secondary | ICD-10-CM | POA: Diagnosis present

## 2014-07-05 MED ORDER — DIPHENHYDRAMINE HCL 25 MG PO TABS: 25.0000 mg | ORAL_TABLET | Freq: Four times a day (QID) | ORAL | Status: DC

## 2014-07-05 MED ORDER — PREDNISONE 10 MG PO TABS: 20.0000 mg | ORAL_TABLET | Freq: Two times a day (BID) | ORAL | Status: DC

## 2014-07-05 MED ORDER — FAMOTIDINE 20 MG PO TABS: 20.0000 mg | ORAL_TABLET | Freq: Two times a day (BID) | ORAL | Status: DC

## 2014-07-05 NOTE — ED Provider Notes (Signed)
CSN: [id number]     Arrival date & time 07/05/14  1633 History   First MD Initiated Contact with Patient 07/05/14 1720     Chief Complaint  Patient presents with  . Rash     (Consider location/radiation/quality/duration/timing/severity/associated sxs/prior Treatment) Patient is a 58 y.o. female presenting with rash. The history is provided by the patient.  Rash Location:  Face  Jacqueline Haney is a 58 y.o. female with a rash to the right side of her face that started a couple days ago. She was cleaning out her flower bed and thinks she rubbed her face after she had pulled poison ivy.  History reviewed. No pertinent past medical history. Past Surgical History  Procedure Laterality Date  . Abdominal hysterectomy    . Splenectomy     History reviewed. No pertinent family history. History  Substance Use Topics  . Smoking status: Current Every Day Smoker    Types: Cigarettes  . Smokeless tobacco: Not on file  . Alcohol Use: No   OB History    No data available     Review of Systems  Skin: Positive for rash.      Allergies  Aspirin; Codeine; Hydrocodone; Oxycodone-acetaminophen; and Tape  Home Medications   Prior to Admission medications   Medication Sig Start Date End Date Taking? Authorizing Provider  traMADol (ULTRAM) 50 MG tablet Take 1 tablet (50 mg total) by mouth every 6 (six) hours as needed. 03/28/14   Orpah Greek, MD   BP 114/78 mmHg  Pulse 80  Temp(Src) 98.6 F (37 C) (Oral)  Resp 20  Ht 5\' 5"  (1.651 m)  Wt 170 lb (77.111 kg)  BMI 28.29 kg/m2  SpO2 99% Physical Exam  Constitutional: She is oriented to person, place, and time. She appears well-developed and well-nourished.  HENT:  Linear vesicular rash to the right side of the face.    Eyes: Conjunctivae and EOM are normal. Pupils are equal, round, and reactive to light.  Neck: Normal range of motion. Neck supple.  Cardiovascular: Normal rate.   Pulmonary/Chest: Effort normal.   Musculoskeletal: Normal range of motion.  Neurological: She is alert and oriented to person, place, and time. No cranial nerve deficit.  Skin: Skin is warm and dry. Rash (face) noted.  Psychiatric: She has a normal mood and affect. Her behavior is normal.  Nursing note and vitals reviewed.   ED Course  Procedures (  MDM  58 y.o. female with rash and itching to the right side of her face. Will treat for contact dermatitis. She will follow up with dermatology if symptoms persist. She will return here as needed. Discussed with the patient clinical findings and plan of care and all questioned fully answered.     Medication List    TAKE these medications        diphenhydrAMINE 25 MG tablet  Commonly known as:  BENADRYL  Take 1 tablet (25 mg total) by mouth every 6 (six) hours.     famotidine 20 MG tablet  Commonly known as:  PEPCID  Take 1 tablet (20 mg total) by mouth 2 (two) times daily.     predniSONE 10 MG tablet  Commonly known as:  DELTASONE  Take 2 tablets (20 mg total) by mouth 2 (two) times daily with a meal.      ASK your doctor about these medications        traMADol 50 MG tablet  Commonly known as:  ULTRAM  Take 1 tablet (50  mg total) by mouth every 6 (six) hours as needed.           [place], [place] 07/05/14 2119  Nat Christen, MD 07/05/14 2130

## 2014-07-05 NOTE — ED Notes (Signed)
Patient given discharge instruction, verbalized understand. Patient ambulatory out of the department.  

## 2014-07-05 NOTE — ED Notes (Signed)
Itching/burning rash to rt side of face.

## 2014-08-12 ENCOUNTER — Emergency Department (HOSPITAL_COMMUNITY): Payer: BC Managed Care – PPO

## 2014-08-12 ENCOUNTER — Encounter (HOSPITAL_COMMUNITY): Payer: Self-pay | Admitting: *Deleted

## 2014-08-12 ENCOUNTER — Emergency Department (HOSPITAL_COMMUNITY)
Admission: EM | Admit: 2014-08-12 | Discharge: 2014-08-12 | Disposition: A | Discharge status: Home / Self Care | Payer: BC Managed Care – PPO | Attending: Emergency Medicine | Admitting: Emergency Medicine

## 2014-08-12 DIAGNOSIS — L0201 Cutaneous abscess of face: Secondary | ICD-10-CM

## 2014-08-12 DIAGNOSIS — K047 Periapical abscess without sinus: Secondary | ICD-10-CM | POA: Diagnosis not present

## 2014-08-12 DIAGNOSIS — Z79899 Other long term (current) drug therapy: Secondary | ICD-10-CM | POA: Insufficient documentation

## 2014-08-12 DIAGNOSIS — Z7952 Long term (current) use of systemic steroids: Secondary | ICD-10-CM | POA: Insufficient documentation

## 2014-08-12 DIAGNOSIS — Z720 Tobacco use: Secondary | ICD-10-CM | POA: Insufficient documentation

## 2014-08-12 DIAGNOSIS — R220 Localized swelling, mass and lump, head: Secondary | ICD-10-CM | POA: Diagnosis present

## 2014-08-12 DIAGNOSIS — K029 Dental caries, unspecified: Secondary | ICD-10-CM | POA: Insufficient documentation

## 2014-08-12 LAB — CBC WITH DIFFERENTIAL/PLATELET
BASOS PCT: 0 % (ref 0–1)
Basophils Absolute: 0 10*3/uL (ref 0.0–0.1)
EOS ABS: 0.2 10*3/uL (ref 0.0–0.7)
Eosinophils Relative: 1 % (ref 0–5)
HCT: 39.2 % (ref 36.0–46.0)
HEMOGLOBIN: 13.2 g/dL (ref 12.0–15.0)
Lymphocytes Relative: 33 % (ref 12–46)
Lymphs Abs: 4.5 10*3/uL — ABNORMAL HIGH (ref 0.7–4.0)
MCH: 29.3 pg (ref 26.0–34.0)
MCHC: 33.7 g/dL (ref 30.0–36.0)
MCV: 86.9 fL (ref 78.0–100.0)
MONOS PCT: 7 % (ref 3–12)
Monocytes Absolute: 1 10*3/uL (ref 0.1–1.0)
NEUTROS PCT: 59 % (ref 43–77)
Neutro Abs: 8 10*3/uL — ABNORMAL HIGH (ref 1.7–7.7)
PLATELETS: 402 10*3/uL — AB (ref 150–400)
RBC: 4.51 MIL/uL (ref 3.87–5.11)
RDW: 13.9 % (ref 11.5–15.5)
WBC: 13.7 10*3/uL — ABNORMAL HIGH (ref 4.0–10.5)

## 2014-08-12 LAB — BASIC METABOLIC PANEL
Anion gap: 7 (ref 5–15)
BUN: 16 mg/dL (ref 6–23)
CO2: 24 mmol/L (ref 19–32)
CREATININE: 0.49 mg/dL — AB (ref 0.50–1.10)
Calcium: 8.7 mg/dL (ref 8.4–10.5)
Chloride: 109 mEq/L (ref 96–112)
Glucose, Bld: 107 mg/dL — ABNORMAL HIGH (ref 70–99)
Potassium: 3 mmol/L — ABNORMAL LOW (ref 3.5–5.1)
Sodium: 140 mmol/L (ref 135–145)

## 2014-08-12 MED ORDER — IOHEXOL 300 MG/ML  SOLN
100.0000 mL | Freq: Once | INTRAMUSCULAR | Status: AC | PRN
  Administered 2014-08-12: 100 mL via INTRAVENOUS

## 2014-08-12 MED ORDER — CLINDAMYCIN HCL 150 MG PO CAPS
450.0000 mg | ORAL_CAPSULE | Freq: Once | ORAL | Status: AC
  Administered 2014-08-12: 450 mg via ORAL
  Filled 2014-08-12: qty 3

## 2014-08-12 MED ORDER — CLINDAMYCIN HCL 300 MG PO CAPS: 300.0000 mg | ORAL_CAPSULE | Freq: Four times a day (QID) | ORAL | Status: DC

## 2014-08-12 MED ORDER — TRAMADOL HCL 50 MG PO TABS: 50.0000 mg | ORAL_TABLET | Freq: Four times a day (QID) | ORAL | Status: DC | PRN

## 2014-08-12 MED ORDER — POTASSIUM CHLORIDE CRYS ER 20 MEQ PO TBCR
40.0000 meq | EXTENDED_RELEASE_TABLET | Freq: Once | ORAL | Status: AC
  Administered 2014-08-12: 40 meq via ORAL
  Filled 2014-08-12: qty 2

## 2014-08-12 MED ORDER — TRAMADOL HCL 50 MG PO TABS
50.0000 mg | ORAL_TABLET | Freq: Once | ORAL | Status: AC
Start: 2014-08-12 — End: 2014-08-12
  Administered 2014-08-12: 50 mg via ORAL
  Filled 2014-08-12: qty 1

## 2014-08-12 MED ORDER — CLINDAMYCIN HCL 150 MG PO CAPS: 300.0000 mg | ORAL_CAPSULE | Freq: Once | ORAL | Status: DC

## 2014-08-12 NOTE — ED Provider Notes (Signed)
CSN: [id number]     Arrival date & time 08/12/14  0138 History   First MD Initiated Contact with Patient 08/12/14 0430     Chief Complaint  Patient presents with  . Facial Swelling     (Consider location/radiation/quality/duration/timing/severity/associated sxs/prior Treatment) HPI  58 year old female with right facial swelling over the past 24 hours. Denies any dental pain. Has been having nasal congestion without rhinorrhea. No fevers. Notes from a. Feels like she has a "sinus infection". Has had a cough for 2 weeks but denies any shortness of breath. Has a history of diabetes and hypertension. Pain is currently rated as a 10 out of 10.  History reviewed. No pertinent past medical history. Past Surgical History  Procedure Laterality Date  . Abdominal hysterectomy    . Splenectomy     No family history on file. History  Substance Use Topics  . Smoking status: Current Every Day Smoker    Types: Cigarettes  . Smokeless tobacco: Not on file  . Alcohol Use: No   OB History    No data available     Review of Systems  Constitutional: Negative for fever.  HENT: Positive for congestion and facial swelling. Negative for dental problem.   Respiratory: Positive for cough.   All other systems reviewed and are negative.     Allergies  Aspirin; Codeine; Hydrocodone; Oxycodone-acetaminophen; and Tape  Home Medications   Prior to Admission medications   Medication Sig Start Date End Date Taking? Authorizing Provider  diphenhydrAMINE (BENADRYL) 25 MG tablet Take 1 tablet (25 mg total) by mouth every 6 (six) hours. 07/05/14   Hope Bunnie Pion, NP  famotidine (PEPCID) 20 MG tablet Take 1 tablet (20 mg total) by mouth 2 (two) times daily. 07/05/14   Hope Bunnie Pion, NP  predniSONE (DELTASONE) 10 MG tablet Take 2 tablets (20 mg total) by mouth 2 (two) times daily with a meal. 07/05/14   Hope Bunnie Pion, NP  traMADol (ULTRAM) 50 MG tablet Take 1 tablet (50 mg total) by mouth every 6 (six) hours  as needed. 03/28/14   Orpah Greek, MD   BP 116/78 mmHg  Pulse 61  Temp(Src) 97.9 F (36.6 C) (Oral)  Resp 18  Ht 5\' 5"  (1.651 m)  Wt 170 lb (77.111 kg)  BMI 28.29 kg/m2  SpO2 99% Physical Exam  Constitutional: She is oriented to person, place, and time. She appears well-developed and well-nourished.  HENT:  Head: Normocephalic and atraumatic.    Right Ear: External ear normal.  Left Ear: External ear normal.  Nose: Nose normal.  Mouth/Throat: No trismus in the jaw. Abnormal dentition.    Eyes: Right eye exhibits no discharge. Left eye exhibits no discharge.  Cardiovascular: Normal rate, regular rhythm and normal heart sounds.   Pulmonary/Chest: Effort normal and breath sounds normal.  Abdominal: Soft. There is no tenderness.  Neurological: She is alert and oriented to person, place, and time.  Skin: Skin is warm and dry.  Vitals reviewed.   ED Course  Procedures (including critical care time) Labs Review Labs Reviewed  BASIC METABOLIC PANEL - Abnormal; Notable for the following:    Potassium 3.0 (*)    Glucose, Bld 107 (*)    Creatinine, Ser 0.49 (*)    All other components within normal limits  CBC WITH DIFFERENTIAL - Abnormal; Notable for the following:    WBC 13.7 (*)    Platelets 402 (*)    Neutro Abs 8.0 (*)    Lymphs Abs 4.5 (*)  All other components within normal limits    Imaging Review Ct Maxillofacial W/cm  08/12/2014   CLINICAL DATA:  Initial evaluation for acute right jaw pain.  EXAM: CT MAXILLOFACIAL WITH CONTRAST  TECHNIQUE: Multidetector CT imaging of the maxillofacial structures was performed with intravenous contrast. Multiplanar CT image reconstructions were also generated. A small metallic BB was placed on the right temple in order to reliably differentiate right from left.  CONTRAST:  13mL OMNIPAQUE IOHEXOL 300 MG/ML  SOLN  COMPARISON:  None.  FINDINGS: The visualized portions of the brain are unremarkable. Orbits are normal.  There  is mild soft tissue swelling adjacent to the right maxilla. A small rim enhancing collection measuring approximately and 11 mm is present within this region, adjacent to the right third maxillary molar (series 2, image 54). Finding most consistent with a small subperiosteal abscess. There are adjacent dental caries, suggesting an odontogenic origin. No other significant inflammatory changes within the face. Mildly prominent 1.3 cm right level 2 node noted, likely reactive. Right level 1B node at the upper limits of normal at 9 mm in short axis.  No acute maxillofacial fracture.  Mild to moderate mucoperiosteal thickening present within the right maxillary sinus. Mild mucosal thickening present within the sphenoid sinuses as well, left greater than right. No air-fluid levels within the sinuses. No mastoid effusion.  IMPRESSION: 1. 11 mm subperiosteal abscess adjacent to the right third maxillary molar. 2. Mild paranasal sinus disease as above, greatest within the right maxillary sinus. 3. Mildly enlarged 1.3 cm right level 2 lymph node, likely reactive.   Electronically Signed   By: Jeannine Boga M.D.   On: 08/12/2014 05:56     EKG Interpretation None      MDM   Final diagnoses:  Dental infection  Abscess of cheek    Patient with small abscess as above, likely dental in origin. No trismus or floor of the mouth swelling. Patient is well-appearing and has a normal airway and no signs of more severe disease. Patient is not grossly ill. At this point will treat with pain medication, antibiotics, and referral to oral surgery.    Ephraim Hamburger, MD 08/12/14 [phone]

## 2014-08-12 NOTE — Discharge Instructions (Signed)
Abscessed Tooth An abscessed tooth is an infection around your tooth. It may be caused by holes or damage to the tooth (cavity) or a dental disease. An abscessed tooth causes mild to very bad pain in and around the tooth. See your dentist right away if you have tooth or gum pain. HOME CARE  Take your medicine as told. Finish it even if you start to feel better.  Do not drive after taking pain medicine.  Rinse your mouth (gargle) often with salt water ( teaspoon salt in 8 ounces of warm water).  Do not apply heat to the outside of your face. GET HELP RIGHT AWAY IF:   You have a temperature by mouth above 102 F (38.9 C), not controlled by medicine.  You have chills and a very bad headache.  You have problems breathing or swallowing.  Your mouth will not open.  You develop puffiness (swelling) on the neck or around the eye.  Your pain is not helped by medicine.  Your pain is getting worse instead of better. MAKE SURE YOU:   Understand these instructions.  Will watch your condition.  Will get help right away if you are not doing well or get worse. Document Released: 01/19/2008 Document Revised: 10/25/2011 Document Reviewed: 11/10/2010 [facility] Patient Information 2015 [place], [place]. This information is not intended to replace advice given to you by your health care provider. Make sure you discuss any questions you have with your health care provider.    Cellulitis Cellulitis is an infection of the skin and the tissue beneath it. The infected area is usually red and tender. Cellulitis occurs most often in the arms and lower legs.  CAUSES  Cellulitis is caused by bacteria that enter the skin through cracks or cuts in the skin. The most common types of bacteria that cause cellulitis are staphylococci and streptococci. SIGNS AND SYMPTOMS   Redness and warmth.  Swelling.  Tenderness or pain.  Fever. DIAGNOSIS  Your health care provider can usually determine what is  wrong based on a physical exam. Blood tests may also be done. TREATMENT  Treatment usually involves taking an antibiotic medicine. HOME CARE INSTRUCTIONS   Take your antibiotic medicine as directed by your health care provider. Finish the antibiotic even if you start to feel better.  Keep the infected arm or leg elevated to reduce swelling.  Apply a warm cloth to the affected area up to 4 times per day to relieve pain.  Take medicines only as directed by your health care provider.  Keep all follow-up visits as directed by your health care provider. SEEK MEDICAL CARE IF:   You notice red streaks coming from the infected area.  Your red area gets larger or turns dark in color.  Your bone or joint underneath the infected area becomes painful after the skin has healed.  Your infection returns in the same area or another area.  You notice a swollen bump in the infected area.  You develop new symptoms.  You have a fever. SEEK IMMEDIATE MEDICAL CARE IF:   You feel very sleepy.  You develop vomiting or diarrhea.  You have a general ill feeling (malaise) with muscle aches and pains. MAKE SURE YOU:   Understand these instructions.  Will watch your condition.  Will get help right away if you are not doing well or get worse. Document Released: 05/12/2005 Document Revised: 12/17/2013 Document Reviewed: 10/18/2011 [employer] Patient Information 2015 [place], [place]. This information is not intended to replace advice given  to you by your health care provider. Make sure you discuss any questions you have with your health care provider.

## 2014-08-12 NOTE — ED Notes (Signed)
Pt reports facial swelling to the right side of her face started yesterday. Does not think she has any bad teeth causing but has been having sinus problems.

## 2014-09-17 ENCOUNTER — Encounter (HOSPITAL_COMMUNITY): Payer: Self-pay | Admitting: Emergency Medicine

## 2014-09-17 ENCOUNTER — Observation Stay (HOSPITAL_COMMUNITY)
Admission: EM | Admit: 2014-09-17 | Discharge: 2014-09-18 | Disposition: A | Discharge status: Home / Self Care | Payer: BC Managed Care – PPO | Attending: Internal Medicine | Admitting: Internal Medicine

## 2014-09-17 ENCOUNTER — Emergency Department (HOSPITAL_COMMUNITY): Payer: BC Managed Care – PPO

## 2014-09-17 DIAGNOSIS — Z716 Tobacco abuse counseling: Secondary | ICD-10-CM

## 2014-09-17 DIAGNOSIS — E119 Type 2 diabetes mellitus without complications: Secondary | ICD-10-CM | POA: Insufficient documentation

## 2014-09-17 DIAGNOSIS — Z79899 Other long term (current) drug therapy: Secondary | ICD-10-CM | POA: Insufficient documentation

## 2014-09-17 DIAGNOSIS — Z8249 Family history of ischemic heart disease and other diseases of the circulatory system: Secondary | ICD-10-CM | POA: Diagnosis not present

## 2014-09-17 DIAGNOSIS — I10 Essential (primary) hypertension: Secondary | ICD-10-CM | POA: Insufficient documentation

## 2014-09-17 DIAGNOSIS — E669 Obesity, unspecified: Secondary | ICD-10-CM

## 2014-09-17 DIAGNOSIS — G43909 Migraine, unspecified, not intractable, without status migrainosus: Secondary | ICD-10-CM | POA: Diagnosis not present

## 2014-09-17 DIAGNOSIS — Z720 Tobacco use: Secondary | ICD-10-CM | POA: Diagnosis not present

## 2014-09-17 DIAGNOSIS — R079 Chest pain, unspecified: Secondary | ICD-10-CM | POA: Diagnosis present

## 2014-09-17 DIAGNOSIS — R0789 Other chest pain: Secondary | ICD-10-CM | POA: Diagnosis not present

## 2014-09-17 DIAGNOSIS — E785 Hyperlipidemia, unspecified: Secondary | ICD-10-CM

## 2014-09-17 HISTORY — DX: Chest pain, unspecified: R07.9

## 2014-09-17 HISTORY — DX: Migraine, unspecified, not intractable, without status migrainosus: G43.909

## 2014-09-17 HISTORY — DX: Hyperlipidemia, unspecified: E78.5

## 2014-09-17 LAB — CBC
HCT: 40.9 % (ref 36.0–46.0)
Hemoglobin: 13.9 g/dL (ref 12.0–15.0)
MCH: 29.4 pg (ref 26.0–34.0)
MCHC: 34 g/dL (ref 30.0–36.0)
MCV: 86.5 fL (ref 78.0–100.0)
Platelets: 337 10*3/uL (ref 150–400)
RBC: 4.73 MIL/uL (ref 3.87–5.11)
RDW: 13.8 % (ref 11.5–15.5)
WBC: 11.1 10*3/uL — ABNORMAL HIGH (ref 4.0–10.5)

## 2014-09-17 LAB — LIPID PANEL
CHOL/HDL RATIO: 5.3 ratio
Cholesterol: 202 mg/dL — ABNORMAL HIGH (ref 0–200)
HDL: 38 mg/dL — ABNORMAL LOW (ref >39)
LDL Cholesterol: 104 mg/dL — ABNORMAL HIGH (ref 0–99)
TRIGLYCERIDES: 299 mg/dL — AB (ref <150)
VLDL: 60 mg/dL — AB (ref 0–40)

## 2014-09-17 LAB — BASIC METABOLIC PANEL
ANION GAP: 4 — AB (ref 5–15)
BUN: 12 mg/dL (ref 6–23)
CO2: 29 mmol/L (ref 19–32)
Calcium: 9.4 mg/dL (ref 8.4–10.5)
Chloride: 109 mmol/L (ref 96–112)
Creatinine, Ser: 0.53 mg/dL (ref 0.50–1.10)
GFR calc Af Amer: >90 mL/min (ref >90)
GFR calc non Af Amer: >90 mL/min (ref >90)
Glucose, Bld: 109 mg/dL — ABNORMAL HIGH (ref 70–99)
Potassium: 3.6 mmol/L (ref 3.5–5.1)
Sodium: 142 mmol/L (ref 135–145)

## 2014-09-17 LAB — CBG MONITORING, ED: GLUCOSE-CAPILLARY: 142 mg/dL — AB (ref 70–99)

## 2014-09-17 LAB — TROPONIN I: Troponin I: <0.03 ng/mL (ref <0.031)

## 2014-09-17 MED ORDER — TRAMADOL HCL 50 MG PO TABS: 50.0000 mg | ORAL_TABLET | Freq: Four times a day (QID) | ORAL | Status: DC | PRN

## 2014-09-17 MED ORDER — SODIUM CHLORIDE 0.9 % IJ SOLN
3.0000 mL | Freq: Two times a day (BID) | INTRAMUSCULAR | Status: DC
  Administered 2014-09-17: 3 mL via INTRAVENOUS

## 2014-09-17 MED ORDER — TRAZODONE HCL 50 MG PO TABS: 25.0000 mg | ORAL_TABLET | Freq: Every evening | ORAL | Status: DC | PRN

## 2014-09-17 MED ORDER — NAPROXEN 250 MG PO TABS
500.0000 mg | ORAL_TABLET | Freq: Once | ORAL | Status: AC
  Administered 2014-09-17: 500 mg via ORAL
  Filled 2014-09-17: qty 2

## 2014-09-17 MED ORDER — ENOXAPARIN SODIUM 40 MG/0.4ML ~~LOC~~ SOLN
40.0000 mg | SUBCUTANEOUS | Status: DC
  Filled 2014-09-17: qty 0.4

## 2014-09-17 MED ORDER — ALUM & MAG HYDROXIDE-SIMETH 200-200-20 MG/5ML PO SUSP: 30.0000 mL | Freq: Four times a day (QID) | ORAL | Status: DC | PRN

## 2014-09-17 MED ORDER — NITROGLYCERIN 0.4 MG SL SUBL
0.4000 mg | SUBLINGUAL_TABLET | SUBLINGUAL | Status: DC | PRN
Start: 2014-09-17 — End: 2014-09-18

## 2014-09-17 MED ORDER — ACETAMINOPHEN 325 MG PO TABS: 650.0000 mg | ORAL_TABLET | Freq: Four times a day (QID) | ORAL | Status: DC | PRN

## 2014-09-17 MED ORDER — SODIUM CHLORIDE 0.9 % IJ SOLN: 3.0000 mL | INTRAMUSCULAR | Status: DC | PRN

## 2014-09-17 MED ORDER — SODIUM CHLORIDE 0.9 % IJ SOLN
3.0000 mL | Freq: Two times a day (BID) | INTRAMUSCULAR | Status: DC
  Administered 2014-09-18: 3 mL via INTRAVENOUS

## 2014-09-17 MED ORDER — NITROGLYCERIN 0.4 MG SL SUBL: 0.4000 mg | SUBLINGUAL_TABLET | SUBLINGUAL | Status: DC | PRN

## 2014-09-17 MED ORDER — SUMATRIPTAN SUCCINATE 50 MG PO TABS
50.0000 mg | ORAL_TABLET | ORAL | Status: DC | PRN
  Filled 2014-09-17: qty 1

## 2014-09-17 MED ORDER — ONDANSETRON HCL 4 MG/2ML IJ SOLN: 4.0000 mg | Freq: Four times a day (QID) | INTRAMUSCULAR | Status: DC | PRN

## 2014-09-17 MED ORDER — ACETAMINOPHEN 650 MG RE SUPP: 650.0000 mg | Freq: Four times a day (QID) | RECTAL | Status: DC | PRN

## 2014-09-17 MED ORDER — SODIUM CHLORIDE 0.9 % IV SOLN: 250.0000 mL | INTRAVENOUS | Status: DC | PRN

## 2014-09-17 MED ORDER — ONDANSETRON HCL 4 MG PO TABS: 4.0000 mg | ORAL_TABLET | Freq: Four times a day (QID) | ORAL | Status: DC | PRN

## 2014-09-17 NOTE — ED Notes (Signed)
Patient c/o chest tightness since last night. C/o central to left chest tightness.

## 2014-09-17 NOTE — H&P (Signed)
[facility] History and Physical  Jacqueline Haney YKD:[id number] DOB: 09-03-1955 DOA: 09/17/2014 Referring physician:  PCP: Rocky Morel, MD   Chief Complaint: chest pain  HPI: Jacqueline Haney is a colorful  59 y.o. female past medical history that includes hyperlipidemia diet-controlled diabetes obesity smoking presents to the emergency department with the chief complaint of chest pain. Pain located anterior chest mostly on the left described as a pressure nonradiating constant at a rate of 8 out of 10 and worse with movement and deep breath. He states this episode started about 9:00 last night and when it had not subsided this morning she decided to come to the hospital. This did not keep her from working or eating but it did interfere with her sleep. Lying flat causes increase in the pain. He denies any nausea vomiting diaphoresis shortness of breath cough abdominal pain. She denies any headache visual disturbances dizziness syncope or near-syncope. She did not take aspirin or any other medications. Workup in the emergency department fluids chest x-ray with borderline cardiomegaly no edema or consolidation. Initial troponin is negative serum glucose is 142 WBCs 11.1 otherwise lab work is unremarkable. EKG with a sinus rhythm and an abnormal R wave progression. In the emergency department she is afebrile hemodynamically stable and not hypoxic.     Review of Systems:  10 point review of systems complete and all systems are negative except as indicated in the history of present illness Past Medical History  Diagnosis Date  . Migraine    Past Surgical History  Procedure Laterality Date  . Abdominal hysterectomy    . Splenectomy     Social History:  reports that she has been smoking Cigarettes.  She has been smoking about 0.50 packs per day. She does not have any smokeless tobacco history on file. She reports that she does not drink alcohol or use illicit drugs. 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 Aspirin Nausea And Vomiting  . Codeine Other (See Comments)    Alters mental status "climbs walls/feels high"  . Hydrocodone Other (See Comments)    Alters mental status "climbs walls/feels high"  . Oxycodone-Acetaminophen Other (See Comments)    Alters mental status "climbing a while/high"  . Tape Dermatitis    No family history on file. mother deceased with a history of diabetes and stroke she has several siblings are collective medical history is positive for diabetes cancer unknown specifics heart attack and stroke Prior to Admission medications   Medication Sig Start Date End Date Taking? Authorizing Provider  clindamycin (CLEOCIN) 300 MG capsule Take 1 capsule (300 mg total) by mouth 4 (four) times daily. X 7 days Patient not taking: Reported on 09/17/2014 08/12/14   Ephraim Hamburger, MD  diphenhydrAMINE (BENADRYL) 25 MG tablet Take 1 tablet (25 mg total) by mouth every 6 (six) hours. Patient not taking: Reported on 09/17/2014 07/05/14   Ashley Murrain, NP  famotidine (PEPCID) 20 MG tablet Take 1 tablet (20 mg total) by mouth 2 (two) times daily. Patient not taking: Reported on 09/17/2014 07/05/14   Ashley Murrain, NP  penicillin v potassium (VEETID) 500 MG tablet Take 500 mg by mouth 2 (two) times daily. 09/16/14   Historical Provider, MD  predniSONE (DELTASONE) 10 MG tablet Take 2 tablets (20 mg total) by mouth 2 (two) times daily with a  meal. Patient not taking: Reported on 09/17/2014 07/05/14   Ashley Murrain, NP  SUMAtriptan (IMITREX) 50 MG tablet Take 50 mg by mouth every 2 (two) hours as needed. 09/16/14   Historical Provider, MD  traMADol (ULTRAM) 50 MG tablet Take 1 tablet (50 mg total) by mouth every 6 (six) hours as needed. Patient not taking: Reported on 09/17/2014 08/12/14   Ephraim Hamburger, MD   Physical Exam: Filed Vitals:    09/17/14 1230 09/17/14 1300 09/17/14 1430 09/17/14 1445  BP: 116/68 119/89 118/82   Pulse: 59 57 70 75  Temp:      TempSrc:      Resp: 17 16 17 23   Height:      Weight:      SpO2: 95% 95% 97% 98%    Wt Readings from Last 3 Encounters:  09/17/14 77.111 kg (170 lb)  08/12/14 77.111 kg (170 lb)  07/05/14 77.111 kg (170 lb)    General:  Appears somewhat anxious but cooperative and comfortable Eyes: PERRL, normal lids, irises & conjunctiva ENT: grossly normal hearing, dismembered of her mouth are moist and pink Neck: no LAD, masses or thyromegaly Cardiovascular: RRR, no m/r/g. No LE edema. Telemetry: SR, no arrhythmias  Respiratory: CTA bilaterally, no w/r/r. Normal respiratory effort. Breath sounds slightly distant Abdomen: soft, ntnd positive bowel sounds Skin: no rash or induration seen on limited exam Musculoskeletal: grossly normal tone BUE/BLE Psychiatric: grossly normal mood and affect, speech fluent and appropriate Neurologic: grossly non-focal.          Labs on Admission:  Basic Metabolic Panel:  Recent Labs Lab 09/17/14 1020  NA 142  K 3.6  CL 109  CO2 29  GLUCOSE 109*  BUN 12  CREATININE 0.53  CALCIUM 9.4   Liver Function Tests: No results for input(s): AST, ALT, ALKPHOS, BILITOT, PROT, ALBUMIN in the last 168 hours. No results for input(s): LIPASE, AMYLASE in the last 168 hours. No results for input(s): AMMONIA in the last 168 hours. CBC:  Recent Labs Lab 09/17/14 1020  WBC 11.1*  HGB 13.9  HCT 40.9  MCV 86.5  PLT 337   Cardiac Enzymes:  Recent Labs Lab 09/17/14 1020  TROPONINI <0.03    BNP (last 3 results) No results for input(s): BNP in the last 8760 hours.  ProBNP (last 3 results) No results for input(s): PROBNP in the last 8760 hours.  CBG:  Recent Labs Lab 09/17/14 0942  GLUCAP 142*    Radiological Exams on Admission: Dg Chest Portable 1 View  09/17/2014   CLINICAL DATA:  Chest pain, more severe on the left than on  the right, for 1 day  EXAM: PORTABLE CHEST - 1 VIEW  COMPARISON:  August 27, 2013  FINDINGS: Lungs are clear. Heart is borderline enlarged with pulmonary vascularity within normal limits. No adenopathy. No bone lesions. No pneumothorax.  IMPRESSION: Borderline cardiomegaly.  No edema or consolidation.   Electronically Signed   By: Lowella Grip M.D.   On: 09/17/2014 10:37    EKG: Independently reviewed sinus rhythm with abnormal R-wave progression  Assessment/Plan Principal Problem:   Chest pain at rest: Some typical and atypical features in a patient with risk factors of smoking obesity reported hyperlipidemia. Will admit for observation to rule out. Will monitor on telemetry cycle her cardiac enzymes get serial EKGs. Will also obtain a 2-D echo. Request cardiology consult. She may benefit from a stress test but can likely be done outpatient Active Problems: Hyperlipidemia. Patient reports she's been  diagnosed with this in the past was given medication" throat away" patient admittedly has a compliance issue. "If I like the pills with her avoiding" I'll get a lipid panel for completeness    Obesity: BMI 28.3 nutritional consult    Tobacco abuse: Continues to smoke. Cessation counseling offered. She didn't appear to be interested in stopping at this point in time cardiology  Code Status: full DVT Prophylaxis: Family Communication: pastor and sister at bedside Disposition Plan: home likely tomorrow  Time spent: 47 minutes  [facility] Pager [phone]

## 2014-09-17 NOTE — ED Provider Notes (Signed)
CSN: [id number]     Arrival date & time 09/17/14  4944 History   First MD Initiated Contact with Patient 09/17/14 [phone]     Chief Complaint  Patient presents with  . Chest Pain     (Consider location/radiation/quality/duration/timing/severity/associated sxs/prior Treatment) The history is provided by the patient.   Jacqueline Haney is a 59 y.o. female with a history significant for hypertension and diet-controlled diabetes presenting for evaluation of mid sternal to left chest tightness which started yesterday evening around 11 PM she was sitting at home.  Her discomfort has been constant.  She denies nausea or vomiting, denies shortness of breath and pain.  She's had no palpitations, dizziness, diaphoresis.  She is on no alleviators for her symptoms.  She does have a significant family history of cardiac disease, sister with CABG at age 48, she is a cigarette smoker.     Past Medical History  Diagnosis Date  . Migraine    Past Surgical History  Procedure Laterality Date  . Abdominal hysterectomy    . Splenectomy     No family history on file. History  Substance Use Topics  . Smoking status: Current Every Day Smoker -- 0.50 packs/day    Types: Cigarettes  . Smokeless tobacco: Not on file  . Alcohol Use: No   OB History    No data available     Review of Systems  Constitutional: Negative for fever.  HENT: Negative for congestion and sore throat.   Eyes: Negative.   Respiratory: Positive for chest tightness. Negative for shortness of breath, wheezing and stridor.   Cardiovascular: Negative for chest pain, palpitations and leg swelling.  Gastrointestinal: Negative for nausea and abdominal pain.  Genitourinary: Negative.   Musculoskeletal: Negative for joint swelling, arthralgias and neck pain.  Skin: Negative.  Negative for rash and wound.  Neurological: Negative for dizziness, weakness, light-headedness, numbness and headaches.  Psychiatric/Behavioral: Negative.        Allergies  Aspirin; Codeine; Hydrocodone; Oxycodone-acetaminophen; and Tape  Home Medications   Prior to Admission medications   Medication Sig Start Date End Date Taking? Authorizing Provider  clindamycin (CLEOCIN) 300 MG capsule Take 1 capsule (300 mg total) by mouth 4 (four) times daily. X 7 days Patient not taking: Reported on 09/17/2014 08/12/14   Ephraim Hamburger, MD  diphenhydrAMINE (BENADRYL) 25 MG tablet Take 1 tablet (25 mg total) by mouth every 6 (six) hours. Patient not taking: Reported on 09/17/2014 07/05/14   Ashley Murrain, NP  famotidine (PEPCID) 20 MG tablet Take 1 tablet (20 mg total) by mouth 2 (two) times daily. Patient not taking: Reported on 09/17/2014 07/05/14   Ashley Murrain, NP  penicillin v potassium (VEETID) 500 MG tablet Take 500 mg by mouth 2 (two) times daily. 09/16/14   Historical Provider, MD  predniSONE (DELTASONE) 10 MG tablet Take 2 tablets (20 mg total) by mouth 2 (two) times daily with a meal. Patient not taking: Reported on 09/17/2014 07/05/14   Ashley Murrain, NP  SUMAtriptan (IMITREX) 50 MG tablet Take 50 mg by mouth every 2 (two) hours as needed. 09/16/14   Historical Provider, MD  traMADol (ULTRAM) 50 MG tablet Take 1 tablet (50 mg total) by mouth every 6 (six) hours as needed. Patient not taking: Reported on 09/17/2014 08/12/14   Ephraim Hamburger, MD   BP 118/82 mmHg  Pulse 75  Temp(Src) 97.5 F (36.4 C) (Oral)  Resp 23  Ht 5\' 5"  (1.651 m)  Wt 170 lb (  77.111 kg)  BMI 28.29 kg/m2  SpO2 98% Physical Exam  Constitutional: She appears well-developed and well-nourished.  HENT:  Head: Normocephalic and atraumatic.  Eyes: Conjunctivae are normal.  Neck: Normal range of motion.  Cardiovascular: Normal rate, regular rhythm, normal heart sounds and intact distal pulses.   Pulmonary/Chest: Effort normal and breath sounds normal. She has no wheezes. She exhibits no tenderness.  Abdominal: Soft. Bowel sounds are normal. There is no tenderness.   Musculoskeletal: Normal range of motion.  Neurological: She is alert.  Skin: Skin is warm and dry.  Psychiatric: She has a normal mood and affect.  Nursing note and vitals reviewed.   ED Course  Procedures (including critical care time) Labs Review Labs Reviewed  CBC - Abnormal; Notable for the following:    WBC 11.1 (*)    All other components within normal limits  BASIC METABOLIC PANEL - Abnormal; Notable for the following:    Glucose, Bld 109 (*)    Anion gap 4 (*)    All other components within normal limits  CBG MONITORING, ED - Abnormal; Notable for the following:    Glucose-Capillary 142 (*)    All other components within normal limits  TROPONIN I    Imaging Review Dg Chest Portable 1 View  09/17/2014   CLINICAL DATA:  Chest pain, more severe on the left than on the right, for 1 day  EXAM: PORTABLE CHEST - 1 VIEW  COMPARISON:  August 27, 2013  FINDINGS: Lungs are clear. Heart is borderline enlarged with pulmonary vascularity within normal limits. No adenopathy. No bone lesions. No pneumothorax.  IMPRESSION: Borderline cardiomegaly.  No edema or consolidation.   Electronically Signed   By: Lowella Grip M.D.   On: 09/17/2014 10:37     EKG Interpretation   Date/Time:  Tuesday September 17 2014 09:43:22 EST Ventricular Rate:  67 PR Interval:  163 QRS Duration: 84 QT Interval:  426 QTC Calculation: 450 R Axis:   -40 Text Interpretation:  Sinus rhythm Abnormal R-wave progression, late  transition Inferior infarct, old Confirmed by COOK  MD, BRIAN ([id number]) on  09/17/2014 10:06:29 AM      MDM   Final diagnoses:  Chest pain, unspecified chest pain type    Patients labs and/or radiological studies were viewed and considered during the medical decision making and disposition process.   Call placed to Dr. Bronson Ing.  Will consult pt in hospital.  Pt was seen by Dr Lacinda Axon during this ed visit.  Patient with chest pressure and risk factors including strong fam hx and  cigarette exposure.  Chest pain overnight observation r/o recommended which pt agrees.  [facility] to admit. Temp admission orders placed.  Evalee Jefferson, PA-C 09/17/14 [place], MD 09/18/14 2019

## 2014-09-17 NOTE — ED Notes (Signed)
Patient states she does not need anything at this time. 

## 2014-09-17 NOTE — Consult Note (Signed)
CARDIOLOGY CONSULT NOTE  Patient ID: Jacqueline Haney MRN: [id number] DOB/AGE: [birth year] 59 y.o.  Admit date: 09/17/2014 Primary Physician Rocky Morel, MD  Reason for Consultation: chest pain  HPI: The patient is a 59 yr old woman with a history of diet-controlled hyperlipidemia and tobacco abuse who presents with chest pain. She was watching TV while visiting her sister (who recently underwent CABG, and also has ESRD and is on hemodialysis, has diabetes as well) when she suddenly experienced sharp, left-sided anterior wall pain. It radiated across the top of her chest but denies radiation to the back, shoulder, neck, or jaw. She denies associated shortness of breath, nausea, palpitations, and vomiting. She denies dysphagia for liquids or solids. She denies orthopnea, paroxysmal nocturnal dyspnea, and leg swelling. She went to bed last night and the pain had not subsided. She awoke this morning at 4 am and got ready for work and did not tell family members about her pain. She said she had chest pain about three years ago and "nothing was found". CXR showed borderline cardiomegaly today. ECG shows normal sinus rhythm with late R wave transition. No signs of old infarct.  Soc: Unmarried. 2 children. 6 grandchildren. 1 great grandchild. Smokes 0.5 ppd x several years. 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  Fam: One sister died of "heart problems". Another sister had CABG as noted above.  Allergies  Allergen Reactions  . Aspirin Nausea And Vomiting  . Codeine Other (See Comments)    Alters mental status "climbs walls/feels high"  . Hydrocodone Other (See Comments)    Alters mental status "climbs walls/feels high"  . Oxycodone-Acetaminophen Other (See Comments)    Alters mental status "climbing a while/high"  . Tape Dermatitis    Current Facility-Administered Medications  Medication Dose Route Frequency Provider Last Rate Last Dose  . nitroGLYCERIN  (NITROSTAT) SL tablet 0.4 mg  0.4 mg Sublingual Q5 min PRN Radene Gunning, NP       Current Outpatient Prescriptions  Medication Sig Dispense Refill  . clindamycin (CLEOCIN) 300 MG capsule Take 1 capsule (300 mg total) by mouth 4 (four) times daily. X 7 days (Patient not taking: Reported on 09/17/2014) 28 capsule 0  . diphenhydrAMINE (BENADRYL) 25 MG tablet Take 1 tablet (25 mg total) by mouth every 6 (six) hours. (Patient not taking: Reported on 09/17/2014) 20 tablet 0  . famotidine (PEPCID) 20 MG tablet Take 1 tablet (20 mg total) by mouth 2 (two) times daily. (Patient not taking: Reported on 09/17/2014) 30 tablet 0  . penicillin v potassium (VEETID) 500 MG tablet Take 500 mg by mouth 2 (two) times daily.    . predniSONE (DELTASONE) 10 MG tablet Take 2 tablets (20 mg total) by mouth 2 (two) times daily with a meal. (Patient not taking: Reported on 09/17/2014) 16 tablet 0  . SUMAtriptan (IMITREX) 50 MG tablet Take 50 mg by mouth every 2 (two) hours as needed.    . traMADol (ULTRAM) 50 MG tablet Take 1 tablet (50 mg total) by mouth every 6 (six) hours as needed. (Patient not taking: Reported on 09/17/2014) 20 tablet 0    Past Medical History  Diagnosis Date  . Migraine     Past Surgical History  Procedure Laterality Date  . Abdominal hysterectomy    . Splenectomy      History   Social History  . Marital Status: Divorced    Spouse Name: N/A    Number of Children:  N/A  . Years of Education: N/A   Occupational History  . Not on file.   Social History Main Topics  . Smoking status: Current Every Day Smoker -- 0.50 packs/day    Types: Cigarettes  . Smokeless tobacco: Not on file  . Alcohol Use: No  . Drug Use: No  . Sexual Activity: Yes    Birth Control/ Protection: Surgical   Other Topics Concern  . Not on file   Social History Narrative       Prior to Admission medications   Medication Sig Start Date End Date Taking? Authorizing Provider  clindamycin (CLEOCIN) 300 MG capsule  Take 1 capsule (300 mg total) by mouth 4 (four) times daily. X 7 days Patient not taking: Reported on 09/17/2014 08/12/14   Ephraim Hamburger, MD  diphenhydrAMINE (BENADRYL) 25 MG tablet Take 1 tablet (25 mg total) by mouth every 6 (six) hours. Patient not taking: Reported on 09/17/2014 07/05/14   Ashley Murrain, NP  famotidine (PEPCID) 20 MG tablet Take 1 tablet (20 mg total) by mouth 2 (two) times daily. Patient not taking: Reported on 09/17/2014 07/05/14   Ashley Murrain, NP  penicillin v potassium (VEETID) 500 MG tablet Take 500 mg by mouth 2 (two) times daily. 09/16/14   Historical Provider, MD  predniSONE (DELTASONE) 10 MG tablet Take 2 tablets (20 mg total) by mouth 2 (two) times daily with a meal. Patient not taking: Reported on 09/17/2014 07/05/14   Ashley Murrain, NP  SUMAtriptan (IMITREX) 50 MG tablet Take 50 mg by mouth every 2 (two) hours as needed. 09/16/14   Historical Provider, MD  traMADol (ULTRAM) 50 MG tablet Take 1 tablet (50 mg total) by mouth every 6 (six) hours as needed. Patient not taking: Reported on 09/17/2014 08/12/14   Ephraim Hamburger, MD     Review of systems complete and found to be negative unless listed above in HPI     Physical exam Blood pressure 118/82, pulse 75, temperature 97.5 F (36.4 C), temperature source Oral, resp. rate 23, height 5\' 5"  (1.651 m), weight 170 lb (77.111 kg), SpO2 98 %. General: NAD Neck: No JVD, no thyromegaly or thyroid nodule.  Lungs: Clear to auscultation bilaterally with normal respiratory effort. CV: Nondisplaced PMI. Regular rate and rhythm, normal S1/S2, no S3/S4, no murmur.  No peripheral edema.  No carotid bruit.  Normal pedal pulses.  Abdomen: Soft, nontender, no hepatosplenomegaly, no distention.  Skin: Intact without lesions or rashes.  Neurologic: Alert and oriented x 3.  Psych: Normal affect. Extremities: No clubbing or cyanosis.  HEENT: Normal.   ECG: Most recent ECG reviewed.  Labs:   Lab Results  Component Value Date    WBC 11.1* 09/17/2014   HGB 13.9 09/17/2014   HCT 40.9 09/17/2014   MCV 86.5 09/17/2014   PLT 337 09/17/2014    Recent Labs Lab 09/17/14 1020  NA 142  K 3.6  CL 109  CO2 29  BUN 12  CREATININE 0.53  CALCIUM 9.4  GLUCOSE 109*   Lab Results  Component Value Date   CKTOTAL 146 04/29/2010   CKMB 1.3 04/29/2010   TROPONINI <0.03 09/17/2014   No results found for: CHOL No results found for: HDL No results found for: LDLCALC No results found for: TRIG No results found for: CHOLHDL No results found for: LDLDIRECT       Studies: Dg Chest Portable 1 View  09/17/2014   CLINICAL DATA:  Chest pain, more severe on the  left than on the right, for 1 day  EXAM: PORTABLE CHEST - 1 VIEW  COMPARISON:  August 27, 2013  FINDINGS: Lungs are clear. Heart is borderline enlarged with pulmonary vascularity within normal limits. No adenopathy. No bone lesions. No pneumothorax.  IMPRESSION: Borderline cardiomegaly.  No edema or consolidation.   Electronically Signed   By: Lowella Grip M.D.   On: 09/17/2014 10:37    ASSESSMENT AND PLAN:  1. Chest pain: Character of chest pain has both typical and atypical features. Risk factors include hyperlipidemia, tobacco abuse, and family history of premature CAD. Will obtain echocardiogram to assess LV systolic function and regional wall motion. Recommend ruling out for acute coronary syndrome with serial troponins. Will plan for exercise Cardiolite stress test on 09/18/14. 2. Hyperlipidemia: Diet-controlled. 3. Tobacco abuse: Cessation counseling provided.   Signed: Kate Sable, M.D., F.A.C.C.  09/17/2014, 3:53 PM

## 2014-09-18 ENCOUNTER — Encounter (HOSPITAL_COMMUNITY): Payer: Self-pay

## 2014-09-18 ENCOUNTER — Observation Stay (HOSPITAL_COMMUNITY): Payer: BC Managed Care – PPO

## 2014-09-18 DIAGNOSIS — Z6828 Body mass index (BMI) 28.0-28.9, adult: Secondary | ICD-10-CM | POA: Diagnosis not present

## 2014-09-18 DIAGNOSIS — R079 Chest pain, unspecified: Secondary | ICD-10-CM | POA: Diagnosis not present

## 2014-09-18 DIAGNOSIS — Z8249 Family history of ischemic heart disease and other diseases of the circulatory system: Secondary | ICD-10-CM | POA: Diagnosis not present

## 2014-09-18 DIAGNOSIS — E669 Obesity, unspecified: Secondary | ICD-10-CM | POA: Diagnosis not present

## 2014-09-18 DIAGNOSIS — Z720 Tobacco use: Secondary | ICD-10-CM | POA: Diagnosis not present

## 2014-09-18 LAB — CBC
HEMATOCRIT: 40 % (ref 36.0–46.0)
HEMOGLOBIN: 13.4 g/dL (ref 12.0–15.0)
MCH: 28.9 pg (ref 26.0–34.0)
MCHC: 33.5 g/dL (ref 30.0–36.0)
MCV: 86.4 fL (ref 78.0–100.0)
Platelets: 362 10*3/uL (ref 150–400)
RBC: 4.63 MIL/uL (ref 3.87–5.11)
RDW: 14.1 % (ref 11.5–15.5)
WBC: 11.5 10*3/uL — ABNORMAL HIGH (ref 4.0–10.5)

## 2014-09-18 LAB — BASIC METABOLIC PANEL
Anion gap: 9 (ref 5–15)
BUN: 16 mg/dL (ref 6–23)
CALCIUM: 9.2 mg/dL (ref 8.4–10.5)
CHLORIDE: 108 mmol/L (ref 96–112)
CO2: 26 mmol/L (ref 19–32)
Creatinine, Ser: 0.52 mg/dL (ref 0.50–1.10)
GFR calc Af Amer: >90 mL/min (ref >90)
GFR calc non Af Amer: >90 mL/min (ref >90)
Glucose, Bld: 105 mg/dL — ABNORMAL HIGH (ref 70–99)
Potassium: 3.7 mmol/L (ref 3.5–5.1)
Sodium: 143 mmol/L (ref 135–145)

## 2014-09-18 MED ORDER — SODIUM CHLORIDE 0.9 % IJ SOLN
INTRAMUSCULAR | Status: AC
  Filled 2014-09-18: qty 3

## 2014-09-18 MED ORDER — TECHNETIUM TC 99M SESTAMIBI GENERIC - CARDIOLITE
30.0000 | Freq: Once | INTRAVENOUS | Status: AC | PRN
  Administered 2014-09-18: 30 via INTRAVENOUS

## 2014-09-18 MED ORDER — SODIUM CHLORIDE 0.9 % IJ SOLN
10.0000 mL | INTRAMUSCULAR | Status: DC | PRN
  Administered 2014-09-18: 10 mL via INTRAVENOUS
  Filled 2014-09-18: qty 10

## 2014-09-18 MED ORDER — REGADENOSON 0.4 MG/5ML IV SOLN
INTRAVENOUS | Status: AC
  Filled 2014-09-18: qty 5

## 2014-09-18 MED ORDER — SODIUM CHLORIDE 0.9 % IJ SOLN
INTRAMUSCULAR | Status: AC
  Administered 2014-09-18: 10 mL via INTRAVENOUS
  Filled 2014-09-18: qty 48

## 2014-09-18 MED ORDER — TECHNETIUM TC 99M SESTAMIBI - CARDIOLITE
10.0000 | Freq: Once | INTRAVENOUS | Status: AC | PRN
  Administered 2014-09-18: 10 via INTRAVENOUS

## 2014-09-18 NOTE — Progress Notes (Signed)
Physician Discharge Summary  Jacqueline Haney GDJ:[id number] DOB: 1956-05-19 DOA: 09/17/2014  PCP: Rocky Morel, MD  Admit date: 09/17/2014 Discharge date: 09/18/2014  Time spent: 40 minutes  Recommendations for Outpatient Follow-up:  1. Follow up with PCP 1-2 weeks for evaluation of any further chest pain as ACS ruled out, diet controlled hyperlipidemia 2. Dr Kristian Covey office will contact to schedule follow up. Echo not done in timely manner and patient had to leave. Will need OP echo  Discharge Diagnoses:  Principal Problem:   Chest pain at rest Active Problems:   Obesity   Tobacco abuse   Family history of coronary artery disease   Discharge Condition: stable  Diet recommendation: heart healthy  Filed Weights   09/17/14 0943 09/18/14 0628  Weight: 77.111 kg (170 lb) 72.349 kg (159 lb 8 oz)    History of present illness:  Jacqueline Haney is a colorful 59 y.o. female past medical history that includes hyperlipidemia diet-controlled diabetes obesity smoking presented to the emergency department on 09/17/14 with the chief complaint of chest pain. Pain located anterior chest mostly on the left described as a pressure nonradiating constant at a rate of 8 out of 10 and worse with movement and deep breath. sHe stated episode started about 9:00 the night before and when it had not subsided by morning she decided to come to the hospital. This did not keep her from working or eating but it did interfere with her sleep. Lying flat caused increase in the pain. sHe denied any nausea vomiting diaphoresis shortness of breath cough abdominal pain. She denied any headache visual disturbances dizziness syncope or near-syncope. She did not take aspirin or any other medications. Workup in the emergency department included chest x-ray with borderline cardiomegaly no edema or consolidation. Initial troponin was negative serum glucose was 142 WBCs 11.1 otherwise lab work  unremarkable. EKG with a  sinus rhythm and an abnormal R wave progression. In the emergency department she was afebrile hemodynamically stable and not hypoxic.    Hospital Course:  Chest pain at rest: Some typical and atypical features in a patient with risk factors of smoking obesity and hyperlipidemia. Troponin negative x2, EKG without acute changes. No events on tele. Echo not done in timely manner and patient had to leave. Stress test negative.  Evaluated by cardiology who ruled out  ACS and recommended dietary and exercise modification for lipid management. Patient non-compliant with all medications. Will need OP echo.  Active Problems: Hyperlipidemia. Patient reports she's been diagnosed with this in the past was given medication that she " threw away" patient admittedly has a compliance issue. "i dont like pills and i throw them away" . Lipid panel cholesterol 202, triglyceride 299 and HDL 38, VLDL 60. Recommend heart healthy diet. Close OP follow up to monitor control.   Obesity: BMI 28.3 nutritional consult   Tobacco abuse: Continues to smoke. Cessation counseling offered. She didn't appear to be interested in stopping at this point in time   Procedures: NM stress test 09/18/14 No reversible ischemia or infarction. 1 mm horizontal ST segment depression lead II with peak exercise. No chest pain reported. Normal left ventricular wall motion. Left ventricular ejection fraction 46% Low-risk stress test findings. Would recommend echocardiogram for a more accurate assessment of LV systolic function.  Consultations:  cardiology  Discharge Exam: Filed Vitals:   09/18/14 0628  BP: 117/76  Pulse: 55  Temp: 98 F (36.7 C)  Resp: 18    General: well nourished appears  comfortable Cardiovascular: RRR No MGR No LE edema Respiratory: normal effort BS clear bilaterally no wheeze  Discharge Instructions    Current Discharge Medication List    CONTINUE these medications which have NOT CHANGED   Details   SUMAtriptan (IMITREX) 50 MG tablet Take 50 mg by mouth every 2 (two) hours as needed.      STOP taking these medications     clindamycin (CLEOCIN) 300 MG capsule      diphenhydrAMINE (BENADRYL) 25 MG tablet      famotidine (PEPCID) 20 MG tablet      penicillin v potassium (VEETID) 500 MG tablet      predniSONE (DELTASONE) 10 MG tablet      traMADol (ULTRAM) 50 MG tablet        Allergies  Allergen Reactions  . Aspirin Nausea And Vomiting  . Codeine Other (See Comments)    Alters mental status "climbs walls/feels high"  . Hydrocodone Other (See Comments)    Alters mental status "climbs walls/feels high"  . Oxycodone-Acetaminophen Other (See Comments)    Alters mental status "climbing a while/high"  . Tape Dermatitis   Follow-up Information    Follow up with Rocky Morel, MD. Schedule an appointment as soon as possible for a visit in 2 weeks.   Specialty:  Family Medicine   Why:  OP follow up on hyperlipidemia and general compliance with recommendations   Contact information:   [address] [place] [address] [phone]        The results of significant diagnostics from this hospitalization (including imaging, microbiology, ancillary and laboratory) are listed below for reference.    Significant Diagnostic Studies: Nm Myocar Multi W/spect W/wall Motion / Ef  09/18/2014   CLINICAL DATA:  59 year old woman with a history of hyperlipidemia and tobacco abuse and strong family history of premature CAD who has been experiencing chest pain and is referred for an ischemic evaluation.  EXAM: MYOCARDIAL IMAGING WITH SPECT (REST AND EXERCISE)  GATED LEFT VENTRICULAR WALL MOTION STUDY  LEFT VENTRICULAR EJECTION FRACTION  TECHNIQUE: Standard myocardial SPECT imaging was performed after resting intravenous injection of 10 mCi Tc-110m sestamibi. Subsequently, exercise tolerance test was performed by the patient under the supervision of the Cardiology staff. At  peak-stress, 30 mCi Tc-80m sestamibi was injected intravenously and standard myocardial SPECT imaging was performed. Quantitative gated imaging was also performed to evaluate left ventricular wall motion, and estimate left ventricular ejection fraction.  COMPARISON:  None.  FINDINGS: Stress/ECG data: The patient was stressed according to the Bruce protocol for 5 min 59 seconds, achieving a work level of 7 Mets. The resting heart rate of 59 beats per min rose to a maximal heart rate of 142 beats per min. This value represents 88% of the maximal, age predicted heart rate. The resting blood pressure of 107/79 rose to a maximum blood pressure of 163/70. The stress test was stopped due to fatigue. No chest pain was reported.  Resting ECG demonstrated normal sinus rhythm with a nonspecific T-wave abnormality. There was 1 mm horizontal ST segment depression in lead II with peak exercise. No arrhythmias were noted.  Perfusion: No decreased activity in the left ventricle on stress imaging to suggest reversible ischemia or infarction.  Wall Motion: Normal left ventricular wall motion. No left ventricular dilation.  Left Ventricular Ejection Fraction: 46 %  End diastolic volume 67 ml  End systolic volume 36 ml  IMPRESSION: 1. No reversible ischemia or infarction. 1 mm horizontal ST segment depression lead  II with peak exercise. No chest pain reported.  2. Normal left ventricular wall motion.  3. Left ventricular ejection fraction 46%  4. Low-risk stress test findings*. Would recommend echocardiogram for a more accurate assessment of LV systolic function.  *2012 Appropriate Use Criteria for Coronary Revascularization Focused Update: J Am Coll Cardiol. 2707;86(7):544-920. http://content.airportbarriers.com.aspx?articleid=1201161   Electronically Signed   By: Kate Sable   On: 09/18/2014 10:00   Dg Chest Portable 1 View  09/17/2014   CLINICAL DATA:  Chest pain, more severe on the left than on the right, for 1 day   EXAM: PORTABLE CHEST - 1 VIEW  COMPARISON:  August 27, 2013  FINDINGS: Lungs are clear. Heart is borderline enlarged with pulmonary vascularity within normal limits. No adenopathy. No bone lesions. No pneumothorax.  IMPRESSION: Borderline cardiomegaly.  No edema or consolidation.   Electronically Signed   By: Lowella Grip M.D.   On: 09/17/2014 10:37    Microbiology: No results found for this or any previous visit (from the past 240 hour(s)).   Labs: Basic Metabolic Panel:  Recent Labs Lab 09/17/14 1020 09/18/14 0607  NA 142 143  K 3.6 3.7  CL 109 108  CO2 29 26  GLUCOSE 109* 105*  BUN 12 16  CREATININE 0.53 0.52  CALCIUM 9.4 9.2   Liver Function Tests: No results for input(s): AST, ALT, ALKPHOS, BILITOT, PROT, ALBUMIN in the last 168 hours. No results for input(s): LIPASE, AMYLASE in the last 168 hours. No results for input(s): AMMONIA in the last 168 hours. CBC:  Recent Labs Lab 09/17/14 1020 09/18/14 0607  WBC 11.1* 11.5*  HGB 13.9 13.4  HCT 40.9 40.0  MCV 86.5 86.4  PLT 337 362   Cardiac Enzymes:  Recent Labs Lab 09/17/14 1020 09/17/14 2031  TROPONINI <0.03 <0.03   BNP: BNP (last 3 results) No results for input(s): BNP in the last 8760 hours.  ProBNP (last 3 results) No results for input(s): PROBNP in the last 8760 hours.  CBG:  Recent Labs Lab 09/17/14 0942  GLUCAP 142*       Signed:  Radene Gunning  [facility] 09/18/2014, 10:21 AM

## 2014-09-18 NOTE — Progress Notes (Signed)
UR completed 

## 2014-09-18 NOTE — Care Management Note (Signed)
    Page 1 of 1   09/18/2014     2:32:36 PM CARE MANAGEMENT NOTE 09/18/2014  Patient:  Jacqueline Haney, Jacqueline Haney   Account Number:  [id number]  Date Initiated:  09/18/2014  Documentation initiated by:  CHILDRESS,JESSICA  Subjective/Objective Assessment:   Pt admitted for r/o chest pain. Pt is from home, independent and has no HH services, DME's or med needs prior to admission. Pt plans to discharge home today with self care. No CM needs identified.     Action/Plan:   Anticipated DC Date:  09/18/2014   Anticipated DC Plan:  [place]  CM consult      Choice offered to / List presented to:             Status of service:  Completed, signed off Medicare Important Message given?   (If response is "NO", the following Medicare IM given date fields will be blank) Date Medicare IM given:   Medicare IM given by:   Date Additional Medicare IM given:   Additional Medicare IM given by:    Discharge Disposition:  HOME/SELF CARE  Per UR Regulation:    If discussed at Long Length of Stay Meetings, dates discussed:    Comments:  09/18/2014 [place], RN, MSN, CM

## 2014-09-18 NOTE — Progress Notes (Signed)
Stress Lab Nurses Notes - Jacqueline Haney  Jacqueline Haney 09/18/2014 Reason for doing test: Chest Pain Type of test: Stress Cardiolite / Inpatient Rm 314 Nurse performing test: Gerrit Halls, RN Nuclear Medicine Tech: Melburn Hake Echo Tech: Not Applicable MD performing test: Jacqueline Haney Family MD: Jacqueline Haney Test explained and consent signed: Yes.   IV started: Saline lock flushed, No redness or edema and Saline lock from floor Symptoms: None Treatment/Intervention: None Reason test stopped: fatigue and reached target HR After recovery IV was: No redness or edema and Saline Lock flushed Patient to return to [place]. Med at : 09:00 Patient discharged: Transported back to room 314 via wc Patient's Condition upon discharge was: stable Comments: During test peak BP 163/70 & HR 142.  Recovery BP 121/83 & HR 70.  Symptoms resolved in recovery.   [name] T

## 2014-09-18 NOTE — Progress Notes (Signed)
Pt discharged home today per Dr. Windell Moment, NP. Pt's IV site D/C'd and WDL. Pt's VSS. Pt provided with home medication list and discharge instructions. Pt encouraged to make follow-up appointment with PCP. Would not allow hospital staff to make follow-up. Pt left floor via WC in stable condition accompanied by NT.

## 2014-09-18 NOTE — Progress Notes (Signed)
    Subjective:  Chest pain last night- no treatment.  Objective:  Vital Signs in the last 24 hours: Temp:  [97.5 F (36.4 C)-98.4 F (36.9 C)] 98 F (36.7 C) (02/03 0628) Pulse Rate:  [55-75] 55 (02/03 0628) Resp:  [16-24] 18 (02/03 0628) BP: (103-139)/(63-98) 117/76 mmHg (02/03 0628) SpO2:  [88 %-100 %] 96 % (02/03 0628) Weight:  [159 lb 8 oz (72.349 kg)-170 lb (77.111 kg)] 159 lb 8 oz (72.349 kg) (02/03 0628)  Intake/Output from previous day: No intake or output data in the 24 hours ending 09/18/14 0844  Physical Exam: General appearance: alert, cooperative and no distress Neck: no carotid bruit and no JVD Lungs: clear to auscultation bilaterally Heart: regular rate and rhythm Extremities: no edema   Rate: 58  Rhythm: normal sinus rhythm and sinus bradycardia  Lab Results:  Recent Labs  09/17/14 1020 09/18/14 0607  WBC 11.1* 11.5*  HGB 13.9 13.4  PLT 337 362    Recent Labs  09/17/14 1020 09/18/14 0607  NA 142 143  K 3.6 3.7  CL 109 108  CO2 29 26  GLUCOSE 109* 105*  BUN 12 16  CREATININE 0.53 0.52    Recent Labs  09/17/14 1020 09/17/14 2031  TROPONINI <0.03 <0.03   No results for input(s): INR in the last 72 hours.  Imaging: Imaging results have been reviewed  Cardiac Studies:  Assessment/Plan:  59 yr old woman with a history of diet-controlled hyperlipidemia, FMHx of CAD, and tobacco abuse who was admitted 09/17/14 with chest pain. Troponin negative x 2. EKG with NSST changes.   Principal Problem:   Chest pain at rest Active Problems:   Tobacco abuse   Obesity   Family history of coronary artery disease   PLAN: Pt exercised 6 minutes of Bruce protocol, no chest pain. Adequate stress. Images pending  Kerin Ransom PA-C [facility] [phone] 09/18/2014, 8:44 AM   The patient was seen and examined, and I agree with the assessment and plan as documented above, with modifications as noted below. Pt ruled out for ACS. No complaints of chest  pain or shortness of breath. Will follow up results of nuclear stress test later this morning. Will need dietary and exercise modification for lipid management. She can be discharged from my standpoint.

## 2014-09-19 LAB — HEMOGLOBIN A1C
HEMOGLOBIN A1C: 6.2 % — AB (ref 4.8–5.6)
Mean Plasma Glucose: 131 mg/dL

## 2014-09-19 NOTE — Discharge Summary (Signed)
Physician Discharge Summary  VIVEKA WILMETH VOZ:[id number] DOB: 02-08-1956 DOA: 09/17/2014  PCP: Rocky Morel, MD  Admit date: 09/17/2014 Discharge date: 09/18/2014  Time spent: 40 minutes  Recommendations for Outpatient Follow-up:  1. Follow up with PCP 1-2 weeks for evaluation of any further chest pain as ACS ruled out, diet controlled hyperlipidemia 2. Dr Kristian Covey office will contact to schedule follow up. Echo not done in timely manner and patient had to leave. Will need OP echo  Discharge Diagnoses:  Principal Problem:  Chest pain at rest Active Problems:  Obesity  Tobacco abuse  Family history of coronary artery disease   Discharge Condition: stable  Diet recommendation: heart healthy  Filed Weights   09/17/14 0943 09/18/14 0628  Weight: 77.111 kg (170 lb) 72.349 kg (159 lb 8 oz)    History of present illness:  Jacqueline Haney is a colorful 59 y.o. female past medical history that includes hyperlipidemia diet-controlled diabetes obesity smoking presented to the emergency department on 09/17/14 with the chief complaint of chest pain. Pain located anterior chest mostly on the left described as a pressure nonradiating constant at a rate of 8 out of 10 and worse with movement and deep breath. sHe stated episode started about 9:00 the night before and when it had not subsided by morning she decided to come to the hospital. This did not keep her from working or eating but it did interfere with her sleep. Lying flat caused increase in the pain. sHe denied any nausea vomiting diaphoresis shortness of breath cough abdominal pain. She denied any headache visual disturbances dizziness syncope or near-syncope. She did not take aspirin or any other medications. Workup in the emergency department included chest x-ray with borderline cardiomegaly no edema or consolidation. Initial troponin was negative serum glucose was 142 WBCs 11.1 otherwise lab work  unremarkable. EKG with a sinus rhythm and an abnormal R wave progression. In the emergency department she was afebrile hemodynamically stable and not hypoxic.    Hospital Course:  Chest pain at rest: Some typical and atypical features in a patient with risk factors of smoking obesity and hyperlipidemia. Troponin negative x2, EKG without acute changes. No events on tele. Echo not done in timely manner and patient had to leave. Stress test negative. Evaluated by cardiology who ruled out ACS and recommended dietary and exercise modification for lipid management. Patient non-compliant with all medications. Will need OP echo.  Active Problems: Hyperlipidemia. Patient reports she's been diagnosed with this in the past was given medication that she " threw away" patient admittedly has a compliance issue. "i dont like pills and i throw them away" . Lipid panel cholesterol 202, triglyceride 299 and HDL 38, VLDL 60. Recommend heart healthy diet. Close OP follow up to monitor control.   Obesity: BMI 28.3 nutritional consult   Tobacco abuse: Continues to smoke. Cessation counseling offered. She didn't appear to be interested in stopping at this point in time   Procedures: NM stress test 09/18/14 No reversible ischemia or infarction. 1 mm horizontal ST segment depression lead II with peak exercise. No chest pain reported. Normal left ventricular wall motion. Left ventricular ejection fraction 46% Low-risk stress test findings. Would recommend echocardiogram for a more accurate assessment of LV systolic function.  Consultations:  cardiology  Discharge Exam: Filed Vitals:   09/18/14 0628  BP: 117/76  Pulse: 55  Temp: 98 F (36.7 C)  Resp: 18    General: well nourished appears comfortable Cardiovascular: RRR No MGR No LE  edema Respiratory: normal effort BS clear bilaterally no wheeze  Discharge Instructions    Current Discharge Medication List    CONTINUE these medications  which have NOT CHANGED   Details  SUMAtriptan (IMITREX) 50 MG tablet Take 50 mg by mouth every 2 (two) hours as needed.      STOP taking these medications     clindamycin (CLEOCIN) 300 MG capsule      diphenhydrAMINE (BENADRYL) 25 MG tablet      famotidine (PEPCID) 20 MG tablet      penicillin v potassium (VEETID) 500 MG tablet      predniSONE (DELTASONE) 10 MG tablet      traMADol (ULTRAM) 50 MG tablet        Allergies  Allergen Reactions  . Aspirin Nausea And Vomiting  . Codeine Other (See Comments)    Alters mental status "climbs walls/feels high"  . Hydrocodone Other (See Comments)    Alters mental status "climbs walls/feels high"  . Oxycodone-Acetaminophen Other (See Comments)    Alters mental status "climbing a while/high"  . Tape Dermatitis   Follow-up Information    Follow up with Rocky Morel, MD. Schedule an appointment as soon as possible for a visit in 2 weeks.   Specialty: Family Medicine   Why: OP follow up on hyperlipidemia and general compliance with recommendations   Contact information:   [address] [place] [address] [phone]         The results of significant diagnostics from this hospitalization (including imaging, microbiology, ancillary and laboratory) are listed below for reference.    Significant Diagnostic Studies:  Imaging Results    Nm Myocar Multi W/spect W/wall Motion / Ef  09/18/2014 CLINICAL DATA: 59 year old woman with a history of hyperlipidemia and tobacco abuse and strong family history of premature CAD who has been experiencing chest pain and is referred for an ischemic evaluation. EXAM: MYOCARDIAL IMAGING WITH SPECT (REST AND EXERCISE) GATED LEFT VENTRICULAR WALL MOTION STUDY LEFT VENTRICULAR EJECTION FRACTION TECHNIQUE: Standard myocardial SPECT imaging was performed after resting intravenous injection of 10 mCi Tc-45m  sestamibi. Subsequently, exercise tolerance test was performed by the patient under the supervision of the Cardiology staff. At peak-stress, 30 mCi Tc-38m sestamibi was injected intravenously and standard myocardial SPECT imaging was performed. Quantitative gated imaging was also performed to evaluate left ventricular wall motion, and estimate left ventricular ejection fraction. COMPARISON: None. FINDINGS: Stress/ECG data: The patient was stressed according to the Bruce protocol for 5 min 59 seconds, achieving a work level of 7 Mets. The resting heart rate of 59 beats per min rose to a maximal heart rate of 142 beats per min. This value represents 88% of the maximal, age predicted heart rate. The resting blood pressure of 107/79 rose to a maximum blood pressure of 163/70. The stress test was stopped due to fatigue. No chest pain was reported. Resting ECG demonstrated normal sinus rhythm with a nonspecific T-wave abnormality. There was 1 mm horizontal ST segment depression in lead II with peak exercise. No arrhythmias were noted. Perfusion: No decreased activity in the left ventricle on stress imaging to suggest reversible ischemia or infarction. Wall Motion: Normal left ventricular wall motion. No left ventricular dilation. Left Ventricular Ejection Fraction: 46 % End diastolic volume 67 ml End systolic volume 36 ml IMPRESSION: 1. No reversible ischemia or infarction. 1 mm horizontal ST segment depression lead II with peak exercise. No chest pain reported. 2. Normal left ventricular wall motion. 3. Left ventricular ejection fraction  46% 4. Low-risk stress test findings*. Would recommend echocardiogram for a more accurate assessment of LV systolic function. *2012 Appropriate Use Criteria for Coronary Revascularization Focused Update: J Am Coll Cardiol. 5176;16(0):737-106. http://content.airportbarriers.com.aspx?articleid=[email or web address] Electronically Signed By: Kate Sable On: 09/18/2014 10:00    Dg Chest Portable 1 View  09/17/2014 CLINICAL DATA: Chest pain, more severe on the left than on the right, for 1 day EXAM: PORTABLE CHEST - 1 VIEW COMPARISON: August 27, 2013 FINDINGS: Lungs are clear. Heart is borderline enlarged with pulmonary vascularity within normal limits. No adenopathy. No bone lesions. No pneumothorax. IMPRESSION: Borderline cardiomegaly. No edema or consolidation. Electronically Signed By: Lowella Grip M.D. On: 09/17/2014 10:37     Microbiology: No results found for this or any previous visit (from the past 240 hour(s)).   Labs: Basic Metabolic Panel:  Last Labs      Recent Labs Lab 09/17/14 1020 09/18/14 0607  NA 142 143  K 3.6 3.7  CL 109 108  CO2 29 26  GLUCOSE 109* 105*  BUN 12 16  CREATININE 0.53 0.52  CALCIUM 9.4 9.2     Liver Function Tests:  Last Labs     No results for input(s): AST, ALT, ALKPHOS, BILITOT, PROT, ALBUMIN in the last 168 hours.    Last Labs     No results for input(s): LIPASE, AMYLASE in the last 168 hours.    Last Labs     No results for input(s): AMMONIA in the last 168 hours.   CBC:  Last Labs      Recent Labs Lab 09/17/14 1020 09/18/14 0607  WBC 11.1* 11.5*  HGB 13.9 13.4  HCT 40.9 40.0  MCV 86.5 86.4  PLT 337 362     Cardiac Enzymes:  Last Labs      Recent Labs Lab 09/17/14 1020 09/17/14 2031  TROPONINI <0.03 <0.03     BNP: BNP (last 3 results)  Recent Labs (within last 365 days)    No results for input(s): BNP in the last 8760 hours.    ProBNP (last 3 results)  Recent Labs (within last 365 days)    No results for input(s): PROBNP in the last 8760 hours.    CBG:  Last Labs      Recent Labs Lab 09/17/14 0942  GLUCAP 142*         Signed:  Radene Gunning [facility] 09/18/2014, 10:21 AM

## 2015-01-28 ENCOUNTER — Other Ambulatory Visit (HOSPITAL_COMMUNITY): Payer: Self-pay | Admitting: Family Medicine

## 2015-01-28 DIAGNOSIS — Z1231 Encounter for screening mammogram for malignant neoplasm of breast: Secondary | ICD-10-CM

## 2015-02-12 ENCOUNTER — Ambulatory Visit (HOSPITAL_COMMUNITY)
Admission: RE | Admit: 2015-02-12 | Discharge: 2015-02-12 | Disposition: A | Discharge status: Home / Self Care | Payer: BC Managed Care – PPO | Source: Ambulatory Visit | Attending: Family Medicine | Admitting: Family Medicine

## 2015-02-12 DIAGNOSIS — Z1231 Encounter for screening mammogram for malignant neoplasm of breast: Secondary | ICD-10-CM | POA: Insufficient documentation

## 2015-06-22 ENCOUNTER — Emergency Department (HOSPITAL_COMMUNITY): Payer: BC Managed Care – PPO

## 2015-06-22 ENCOUNTER — Emergency Department (HOSPITAL_COMMUNITY)
Admission: EM | Admit: 2015-06-22 | Discharge: 2015-06-22 | Disposition: A | Discharge status: Home / Self Care | Payer: BC Managed Care – PPO | Attending: Emergency Medicine | Admitting: Emergency Medicine

## 2015-06-22 ENCOUNTER — Encounter (HOSPITAL_COMMUNITY): Payer: Self-pay | Admitting: *Deleted

## 2015-06-22 DIAGNOSIS — M79651 Pain in right thigh: Secondary | ICD-10-CM | POA: Diagnosis present

## 2015-06-22 DIAGNOSIS — M791 Myalgia, unspecified site: Secondary | ICD-10-CM

## 2015-06-22 DIAGNOSIS — R208 Other disturbances of skin sensation: Secondary | ICD-10-CM | POA: Insufficient documentation

## 2015-06-22 DIAGNOSIS — Z720 Tobacco use: Secondary | ICD-10-CM | POA: Insufficient documentation

## 2015-06-22 DIAGNOSIS — G43909 Migraine, unspecified, not intractable, without status migrainosus: Secondary | ICD-10-CM | POA: Diagnosis not present

## 2015-06-22 DIAGNOSIS — Z8639 Personal history of other endocrine, nutritional and metabolic disease: Secondary | ICD-10-CM | POA: Diagnosis not present

## 2015-06-22 MED ORDER — PROMETHAZINE HCL 25 MG/ML IJ SOLN
25.0000 mg | Freq: Once | INTRAMUSCULAR | Status: AC
  Administered 2015-06-22: 25 mg via INTRAMUSCULAR
  Filled 2015-06-22: qty 1

## 2015-06-22 MED ORDER — ONDANSETRON 4 MG PO TBDP
4.0000 mg | ORAL_TABLET | Freq: Once | ORAL | Status: AC
  Administered 2015-06-22: 4 mg via ORAL
  Filled 2015-06-22: qty 1

## 2015-06-22 MED ORDER — ONDANSETRON 4 MG PO TBDP: 4.0000 mg | ORAL_TABLET | Freq: Three times a day (TID) | ORAL | Status: DC | PRN

## 2015-06-22 MED ORDER — ONDANSETRON 8 MG PO TBDP
8.0000 mg | ORAL_TABLET | Freq: Once | ORAL | Status: AC
  Administered 2015-06-22: 8 mg via ORAL
  Filled 2015-06-22: qty 1

## 2015-06-22 MED ORDER — HYDROCODONE-ACETAMINOPHEN 5-325 MG PO TABS: 1.0000 | ORAL_TABLET | Freq: Four times a day (QID) | ORAL | Status: DC | PRN

## 2015-06-22 MED ORDER — HYDROMORPHONE HCL 2 MG/ML IJ SOLN
2.0000 mg | Freq: Once | INTRAMUSCULAR | Status: AC
  Administered 2015-06-22: 2 mg via INTRAMUSCULAR
  Filled 2015-06-22: qty 1

## 2015-06-22 NOTE — ED Notes (Signed)
Pt states severe pain to right leg. States some pain to lower back. Pain radiating from right hip to the knee. She states it was worse this morning. Pain is currently described as burning. Pain was previously "worse than sharp, the worse pain ever" Pt began to fall at church due to pain but was caught by another church member and eased to the [place].

## 2015-06-22 NOTE — ED Notes (Signed)
Pt with sudden right upper leg pain today, denies injury to it, unable to put weight on leg

## 2015-06-22 NOTE — ED Notes (Signed)
Pt began heaving. Med for nausea given. Pt states pain is better.

## 2015-06-22 NOTE — Discharge Instructions (Signed)
Musculoskeletal Pain Musculoskeletal pain is muscle and boney aches and pains. These pains can occur in any part of the body. Your caregiver may treat you without knowing the cause of the pain. They may treat you if blood or urine tests, X-rays, and other tests were normal.  CAUSES There is often not a definite cause or reason for these pains. These pains may be caused by a type of germ (virus). The discomfort may also come from overuse. Overuse includes working out too hard when your body is not fit. Boney aches also come from weather changes. Bone is sensitive to atmospheric pressure changes. HOME CARE INSTRUCTIONS   Ask when your test results will be ready. Make sure you get your test results.  Only take over-the-counter or prescription medicines for pain, discomfort, or fever as directed by your caregiver. If you were given medications for your condition, do not drive, operate machinery or power tools, or sign legal documents for 24 hours. Do not drink alcohol. Do not take sleeping pills or other medications that may interfere with treatment.  Continue all activities unless the activities cause more pain. When the pain lessens, slowly resume normal activities. Gradually increase the intensity and duration of the activities or exercise.  During periods of severe pain, bed rest may be helpful. Lay or sit in any position that is comfortable.  Putting ice on the injured area.  Put ice in a bag.  Place a towel between your skin and the bag.  Leave the ice on for 15 to 20 minutes, 3 to 4 times a day.  Follow up with your caregiver for continued problems and no reason can be found for the pain. If the pain becomes worse or does not go away, it may be necessary to repeat tests or do additional testing. Your caregiver may need to look further for a possible cause. SEEK IMMEDIATE MEDICAL CARE IF:  You have pain that is getting worse and is not relieved by medications.  You develop chest pain  that is associated with shortness or breath, sweating, feeling sick to your stomach (nauseous), or throw up (vomit).  Your pain becomes localized to the abdomen.  You develop any new symptoms that seem different or that concern you. MAKE SURE YOU:   Understand these instructions.  Will watch your condition.  Will get help right away if you are not doing well or get worse.   This information is not intended to replace advice given to you by your health care provider. Make sure you discuss any questions you have with your health care provider.   Make an appointment to follow-up with your provider. Take pain medicine as needed take the antinausea medicine as needed for any nausea or vomiting due to the pain medicine. X-rays of the right thigh and femur were negative. Return for any new or worse symptoms.   Document Released: 08/02/2005 Document Revised: 10/25/2011 Document Reviewed: 04/06/2013 Elsevier Interactive Patient Education [employer].

## 2015-06-22 NOTE — ED Provider Notes (Addendum)
CSN: [id number]     Arrival date & time 06/22/15  1256 History   First MD Initiated Contact with Patient 06/22/15 1304     Chief Complaint  Patient presents with  . Leg Pain     (Consider location/radiation/quality/duration/timing/severity/associated sxs/prior Treatment) Patient is a 59 y.o. female presenting with leg pain. The history is provided by the patient.  Leg Pain Associated symptoms: no back pain, no fever and no neck pain    patient with acute onset of right thigh pain gloves like all over and a burning sensation on the upper part of the leg. No fall or injury no history of similar pain. No back pain. Came on suddenly listed as 8 out of 10. No numbness to the foot or weakness no increase pain with range of motion of the leg. Patient stated in triage she was unable to put weight on the leg. Pain described as very sharp. A burning aspect is just from the need to of the upper third of the leg and is also glovelike. The pain in the thighs glovelike. No history of similar pain.  Past Medical History  Diagnosis Date  . Migraine   . Hyperlipidemia     refuses meds/diet controlled  . Chest pain     ACS rules out stress test negative   Past Surgical History  Procedure Laterality Date  . Abdominal hysterectomy    . Splenectomy     History reviewed. No pertinent family history. Social History  Substance Use Topics  . Smoking status: Current Every Day Smoker -- 0.50 packs/day    Types: Cigarettes  . Smokeless tobacco: None  . Alcohol Use: No   OB History    No data available     Review of Systems  Constitutional: Negative for fever.  HENT: Negative for congestion.   Eyes: Negative for visual disturbance.  Respiratory: Negative for shortness of breath.   Cardiovascular: Negative for chest pain.  Gastrointestinal: Negative for abdominal pain.  Genitourinary: Negative for dysuria.  Musculoskeletal: Positive for myalgias. Negative for back pain, joint swelling, arthralgias  and neck pain.  Skin: Negative for rash.  Neurological: Negative for weakness, numbness and headaches.  Hematological: Does not bruise/bleed easily.  Psychiatric/Behavioral: Negative for confusion.      Allergies  Aspirin; Codeine; Hydrocodone; Oxycodone-acetaminophen; and Tape  Home Medications   Prior to Admission medications   Medication Sig Start Date End Date Taking? Authorizing Provider  SUMAtriptan (IMITREX) 50 MG tablet Take 50 mg by mouth every 2 (two) hours as needed. 09/16/14  Yes Historical Provider, MD  HYDROcodone-acetaminophen (NORCO/VICODIN) 5-325 MG tablet Take 1-2 tablets by mouth every 6 (six) hours as needed. 06/22/15   Fredia Sorrow, MD  ondansetron (ZOFRAN ODT) 4 MG disintegrating tablet Take 1 tablet (4 mg total) by mouth every 8 (eight) hours as needed. 06/22/15   Fredia Sorrow, MD   BP 144/92 mmHg  Pulse 87  Temp(Src) 97.6 F (36.4 C) (Oral)  Resp 22  Ht 5\' 5"  (1.651 m)  Wt 170 lb (77.111 kg)  BMI 28.29 kg/m2  SpO2 100% Physical Exam  Constitutional: She is oriented to person, place, and time. She appears well-developed and well-nourished. No distress.  HENT:  Head: Normocephalic and atraumatic.  Mouth/Throat: Oropharynx is clear and moist.  Eyes: Conjunctivae and EOM are normal. Pupils are equal, round, and reactive to light.  Neck: Normal range of motion. Neck supple.  Cardiovascular: Normal rate, regular rhythm and normal heart sounds.   No murmur heard. Pulmonary/Chest:  Effort normal and breath sounds normal. No respiratory distress.  Abdominal: Soft. Bowel sounds are normal. There is no tenderness.  Musculoskeletal: Normal range of motion. She exhibits no edema or tenderness.  A right leg without any swelling no erythema dorsalis pedis pulse is 2+. No swelling to the knee. No tenderness to palpation to the right thigh right hip or right leg. No pain with range of motion at the foot ankle knee or hip. Sensation intact to the foot.  Neurological:  She is alert and oriented to person, place, and time. No cranial nerve deficit. She exhibits normal muscle tone. Coordination normal.  Skin: Skin is warm. No rash noted. No erythema.  Nursing note and vitals reviewed.   ED Course  Procedures (including critical care time) Labs Review Labs Reviewed - No data to display  Imaging Review Dg Femur, Min 2 Views Right  06/22/2015  CLINICAL DATA:  Right femur pain EXAM: RIGHT FEMUR 2 VIEWS COMPARISON:  None. FINDINGS: There is no evidence of fracture or other focal bone lesions. Soft tissues are unremarkable. IMPRESSION: Negative. Electronically Signed   By: Kerby Moors M.D.   On: 06/22/2015 14:52   I have personally reviewed and evaluated these images and lab results as part of my medical decision-making.   EKG Interpretation None      Patient has listed an allergy to most pain medicines but they're not true allergies. They just make her feel high. She prefers not to take them if at all possible. Patient did agree to some IM hydromorphone here. Which did provide resolution of the leg pain and the burning sensation. But did cause some nausea and vomiting. The patient given Zofran ODT.   MDM   Final diagnoses:  Myalgia    Patient with acute onset of right thigh pain gloves like while at church onset was around 12:30 PM, no injury or fall no history of similar pain. Associated with a burning sensation just below the knee on the upper part of the leg. No numbness or weakness in the foot no leg swelling no back pain. Patient does not have any history of diabetes. No evidence on exam of any vascular insufficiency or anything suggestive of deep vein thrombosis. Not consistent with sciatica.  X-ray of the right femur without any bony abnormalities. Will treat as a musculoskeletal type pain. And have patient follow-up with her primary care doctor.    Fredia Sorrow, MD 06/22/15 1534  Fredia Sorrow, MD 06/22/15 1534

## 2015-06-22 NOTE — ED Notes (Signed)
Pt still having dry heaves. States she is not ready to go home.

## 2015-06-22 NOTE — ED Notes (Signed)
Pt continued to heave. New order received for zofran ODT and administered.

## 2016-01-21 ENCOUNTER — Emergency Department (HOSPITAL_COMMUNITY)
Admission: EM | Admit: 2016-01-21 | Discharge: 2016-01-21 | Disposition: A | Discharge status: Home / Self Care | Payer: BC Managed Care – PPO | Attending: Emergency Medicine | Admitting: Emergency Medicine

## 2016-01-21 ENCOUNTER — Encounter (HOSPITAL_COMMUNITY): Payer: Self-pay | Admitting: *Deleted

## 2016-01-21 DIAGNOSIS — E785 Hyperlipidemia, unspecified: Secondary | ICD-10-CM | POA: Diagnosis not present

## 2016-01-21 DIAGNOSIS — K0889 Other specified disorders of teeth and supporting structures: Secondary | ICD-10-CM | POA: Diagnosis present

## 2016-01-21 DIAGNOSIS — F17210 Nicotine dependence, cigarettes, uncomplicated: Secondary | ICD-10-CM | POA: Diagnosis not present

## 2016-01-21 DIAGNOSIS — K029 Dental caries, unspecified: Secondary | ICD-10-CM | POA: Insufficient documentation

## 2016-01-21 MED ORDER — HYDROCODONE-ACETAMINOPHEN 5-325 MG PO TABS
1.0000 | ORAL_TABLET | Freq: Once | ORAL | Status: AC
  Administered 2016-01-21: 1 via ORAL
  Filled 2016-01-21: qty 1

## 2016-01-21 MED ORDER — PENICILLIN V POTASSIUM 500 MG PO TABS: 500.0000 mg | ORAL_TABLET | Freq: Three times a day (TID) | ORAL | Status: DC

## 2016-01-21 MED ORDER — PENICILLIN V POTASSIUM 250 MG PO TABS
500.0000 mg | ORAL_TABLET | Freq: Once | ORAL | Status: AC
  Administered 2016-01-21: 500 mg via ORAL
  Filled 2016-01-21: qty 2

## 2016-01-21 MED ORDER — HYDROCODONE-ACETAMINOPHEN 5-325 MG PO TABS
1.0000 | ORAL_TABLET | Freq: Four times a day (QID) | ORAL | Status: DC | PRN
Start: 2016-01-21 — End: 2016-09-30

## 2016-01-21 NOTE — ED Notes (Signed)
Pt states that she started having dental pain at approx 0845. Pt states she was drinking tea when this pain started. Pt states she does not go to the dentist. NAD noted.

## 2016-01-21 NOTE — ED Provider Notes (Signed)
CSN: [id number]     Arrival date & time 01/21/16  [id number] History   By signing my name below, I, Jacqueline Haney, attest that this documentation has been prepared under the direction and in the presence of Isla Pence, MD.   Electronically Signed: Nicole Haney, ED Scribe. 01/21/2016. 9:06 AM   Chief Complaint  Patient presents with  . Dental Pain    The history is provided by the patient. No language interpreter was used.   HPI Comments: Jacqueline Haney is a 60 y.o. female who presents to the Emergency Department complaining of sudden onset, lower right-sided dental pain, onset about 10 minutes PTA. She states that the pain began while she was drinking tea. No other associated symptoms noted. Pt took tylenol PTA with n relief to symptoms. No other worsening or alleviating factors noted. Pt denies fever, chills, or any other pertinent symptoms.  Past Medical History  Diagnosis Date  . Migraine   . Hyperlipidemia     refuses meds/diet controlled  . Chest pain     ACS rules out stress test negative   Past Surgical History  Procedure Laterality Date  . Abdominal hysterectomy    . Splenectomy     No family history on file. Social History  Substance Use Topics  . Smoking status: Current Every Day Smoker -- 0.50 packs/day    Types: Cigarettes  . Smokeless tobacco: None  . Alcohol Use: No   OB History    No data available     Review of Systems  Constitutional: Negative for fever and chills.  HENT: Positive for dental problem.   All other systems reviewed and are negative.   Allergies  Aspirin; Codeine; Dilaudid; Hydrocodone; Oxycodone-acetaminophen; and Tape  Home Medications   Prior to Admission medications   Medication Sig Start Date End Date Taking? Authorizing Provider  HYDROcodone-acetaminophen (NORCO/VICODIN) 5-325 MG tablet Take 1 tablet by mouth every 6 (six) hours as needed for severe pain. 01/21/16   Isla Pence, MD  ondansetron (ZOFRAN ODT) 4 MG  disintegrating tablet Take 1 tablet (4 mg total) by mouth every 8 (eight) hours as needed. 06/22/15   Fredia Sorrow, MD  penicillin v potassium (VEETID) 500 MG tablet Take 1 tablet (500 mg total) by mouth 3 (three) times daily. 01/21/16   Isla Pence, MD  SUMAtriptan (IMITREX) 50 MG tablet Take 50 mg by mouth every 2 (two) hours as needed. 09/16/14   Historical Provider, MD   BP 151/95 mmHg  Pulse 76  Resp 16  Wt 170 lb (77.111 kg)  SpO2 98% Physical Exam  Constitutional: She is oriented to person, place, and time. She appears well-developed and well-nourished.  HENT:  Head: Normocephalic.  Mouth/Throat: Mucous membranes are not dry. No trismus in the jaw. Abnormal dentition. Dental caries present. No dental abscesses.  Dental caries diffusely. Significant caries noted to tooth #17.  Eyes: EOM are normal.  Neck: Normal range of motion.  Cardiovascular: Normal rate.   Pulmonary/Chest: Effort normal.  Abdominal: She exhibits no distension.  Musculoskeletal: Normal range of motion.  Neurological: She is alert and oriented to person, place, and time.  Psychiatric: She has a normal mood and affect.  Nursing note and vitals reviewed.   ED Course  Procedures (including critical care time) DIAGNOSTIC STUDIES: Oxygen Saturation is 98% on RA, normal by my interpretation.    COORDINATION OF CARE: 8:53 AM Discussed treatment plan with pt at bedside and pt agreed to plan.   Labs Review Labs Reviewed -  No data to display  Imaging Review No results found. I have personally reviewed and evaluated these images and lab results as part of my medical decision-making.   EKG Interpretation None      MDM  PT TOLD TO F/U WITH THE DENTIST.  SHE IS GIVEN THE NUMBER TO THE [facility].  RETURN IF WORSE. Final diagnoses:  Dental caries    I personally performed the services described in this documentation, which was scribed in my presence. The recorded information has been reviewed and  is accurate.      Isla Pence, MD 01/21/16 [id number]

## 2016-01-21 NOTE — Discharge Instructions (Signed)
Dental Caries °Dental caries is tooth decay. This decay can cause a hole in teeth (cavity) that can get bigger and deeper over time. °HOME CARE °· Brush and floss your teeth. Do this at least two times a day. °· Use a fluoride toothpaste. °· Use a mouth rinse if told by your dentist or doctor. °· Eat less sugary and starchy foods. Drink less sugary drinks. °· Avoid snacking often on sugary and starchy foods. Avoid sipping often on sugary drinks. °· Keep regular checkups and cleanings with your dentist. °· Use fluoride supplements if told by your dentist or doctor. °· Allow fluoride to be applied to teeth if told by your dentist or doctor. °  °This information is not intended to replace advice given to you by your health care provider. Make sure you discuss any questions you have with your health care provider. °  °Document Released: 05/11/2008 Document Revised: 08/23/2014 Document Reviewed: 08/04/2012 °Elsevier Interactive Patient Education ©2016 [employer] ° °

## 2016-01-21 NOTE — ED Notes (Signed)
Pt verbalizes she has a ride home.

## 2016-01-29 IMAGING — CR DG WRIST COMPLETE 3+V*R*
2 series · 2 of 2 positions shown · non-contrast
Comparison: None.

CLINICAL DATA: Pain post trauma

EXAM:
RIGHT WRIST - COMPLETE 3+ VIEW

[view not recorded (1 of 2)]
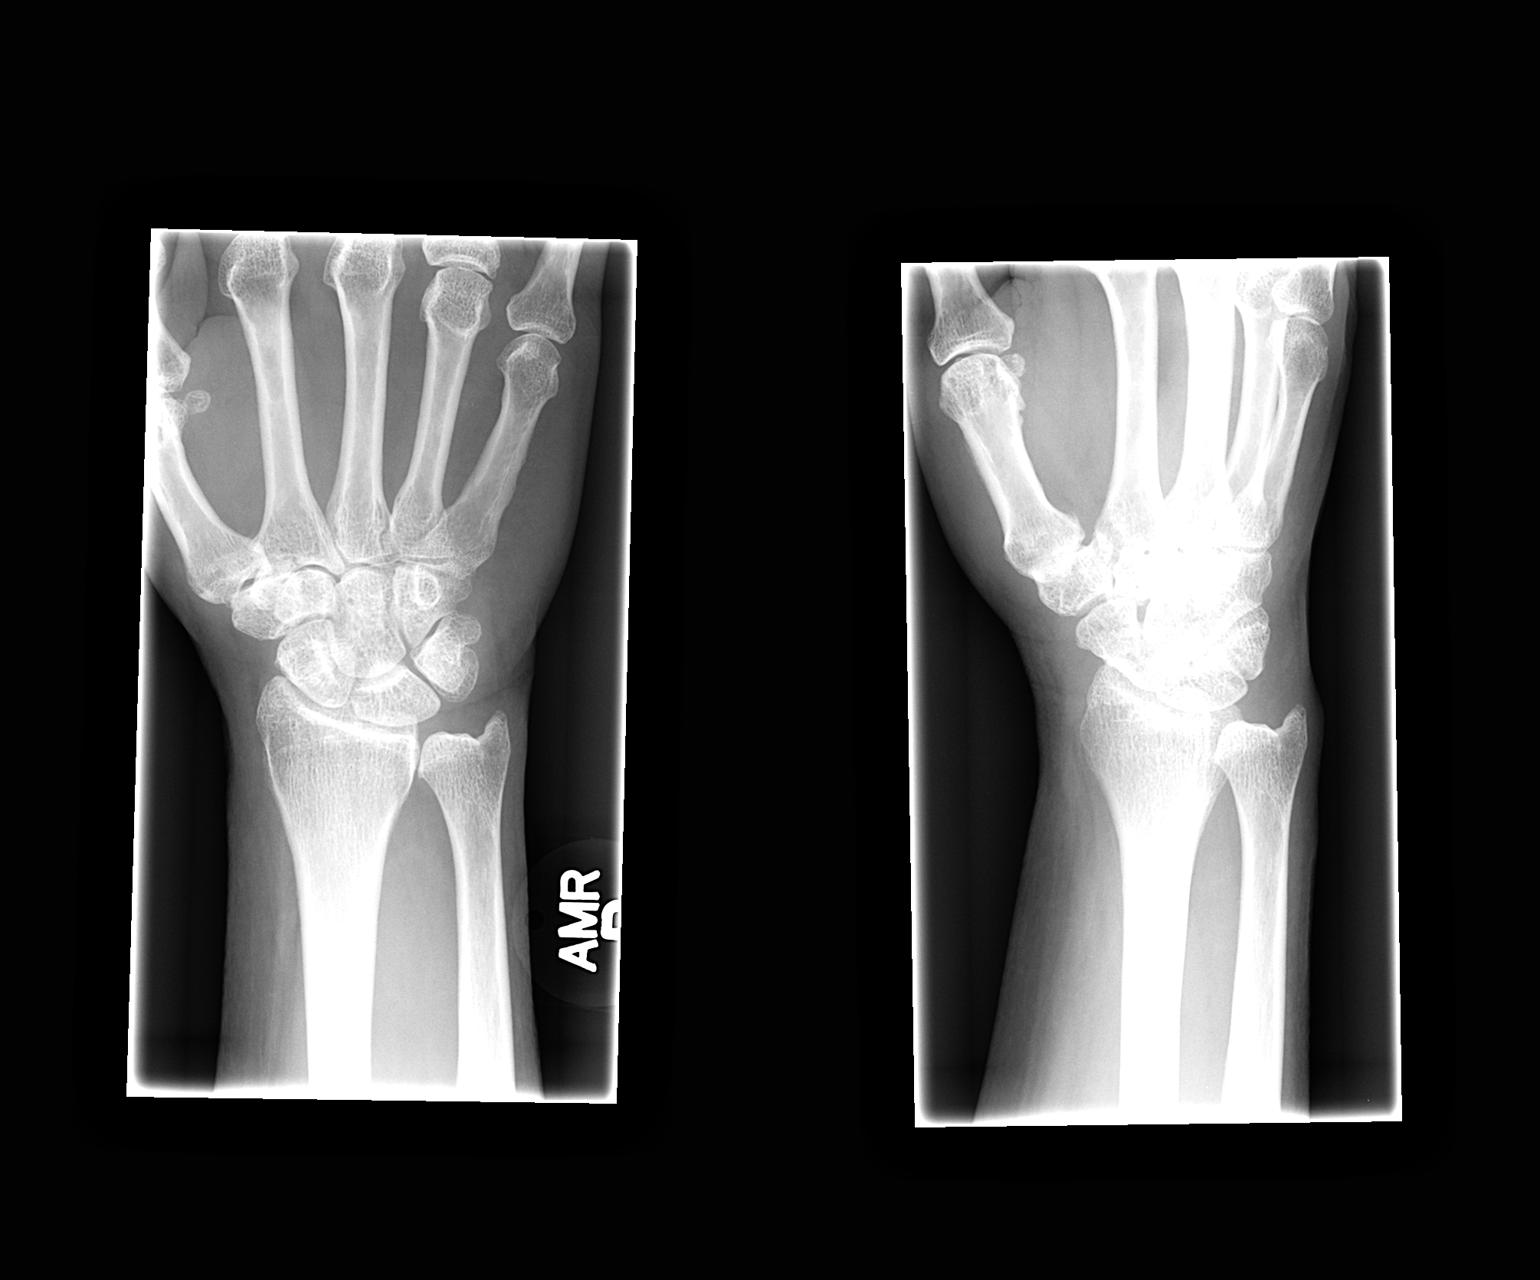

[view not recorded (2 of 2)]
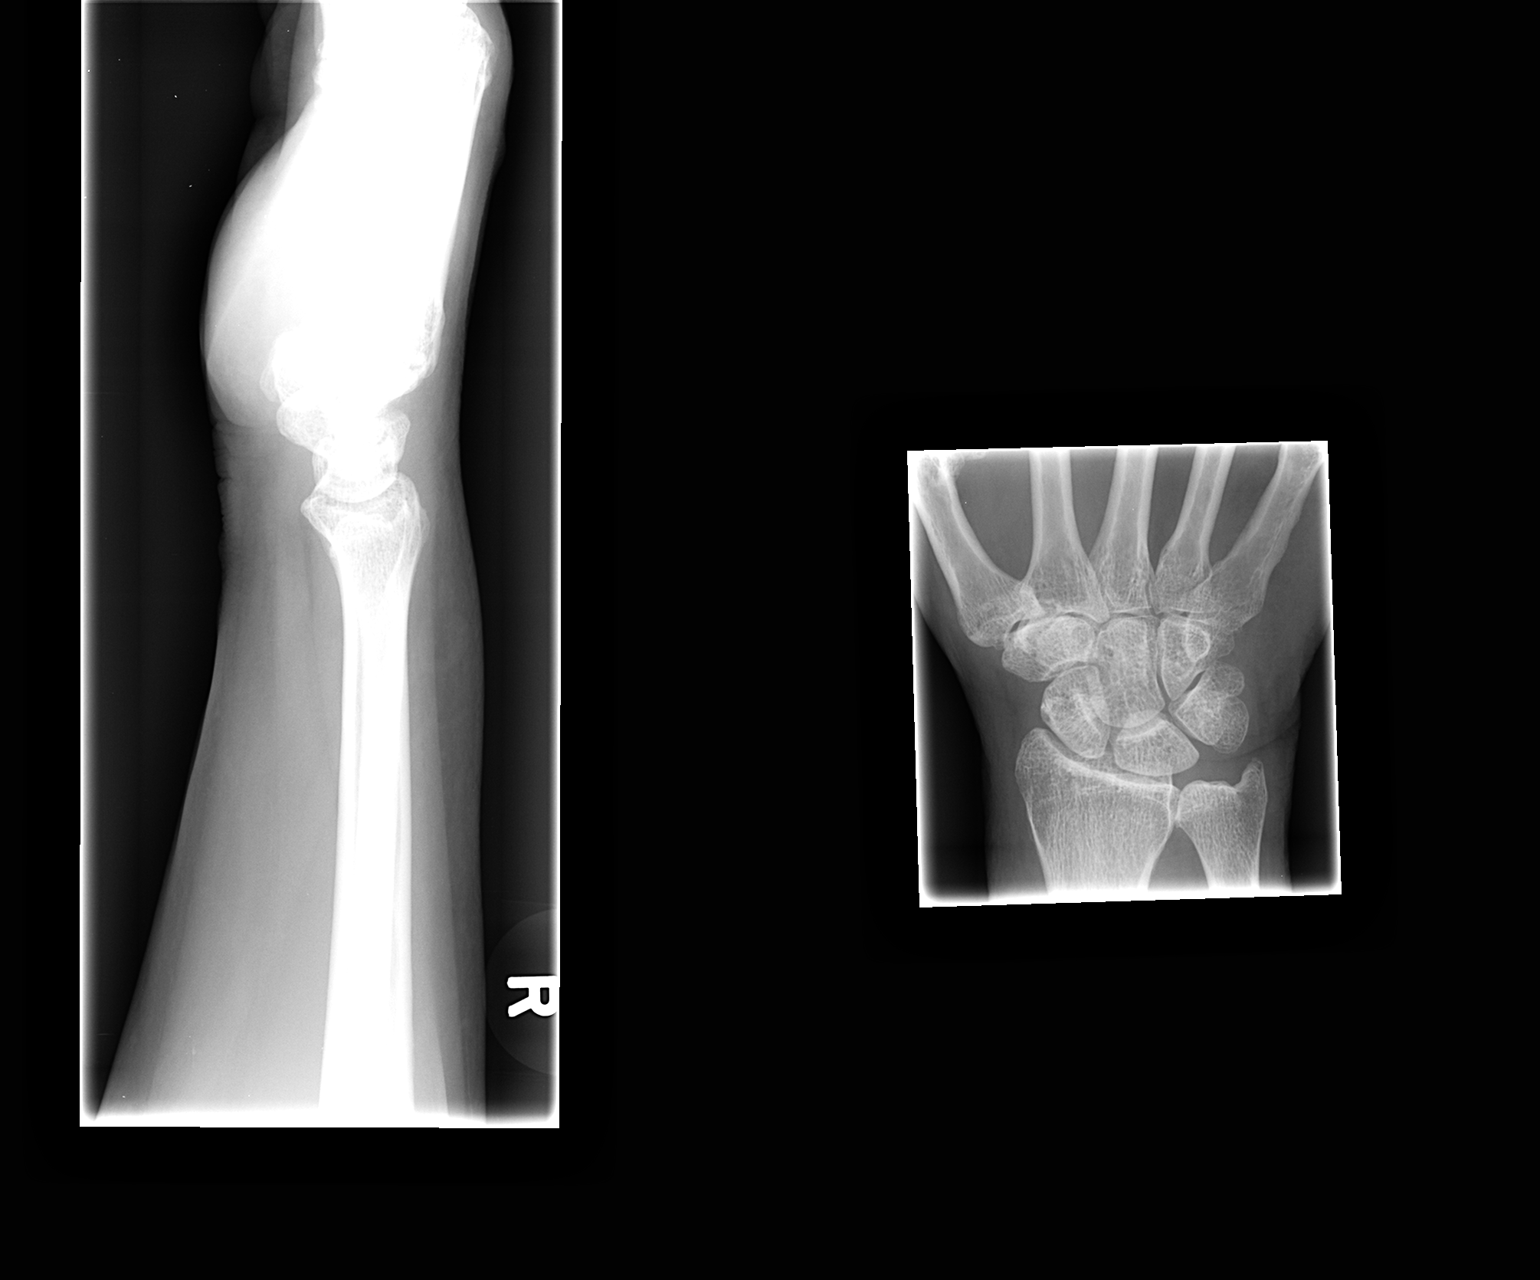

[2 of 2 positions shown; findings below may reference images not displayed]

FINDINGS: Frontal, oblique, lateral, and ulnar deviation scaphoid images were
obtained. There is narrowing in the radiocarpal joint region. No
fracture or dislocation. No erosive change.
IMPRESSION: Osteoarthritic change in the radiocarpal joint. No fracture or
dislocation.

## 2016-05-13 ENCOUNTER — Emergency Department (HOSPITAL_COMMUNITY): Payer: BC Managed Care – PPO

## 2016-05-13 ENCOUNTER — Encounter (HOSPITAL_COMMUNITY): Payer: Self-pay | Admitting: Emergency Medicine

## 2016-05-13 ENCOUNTER — Emergency Department (HOSPITAL_COMMUNITY)
Admission: EM | Admit: 2016-05-13 | Discharge: 2016-05-13 | Disposition: A | Discharge status: Home / Self Care | Payer: BC Managed Care – PPO | Attending: Emergency Medicine | Admitting: Emergency Medicine

## 2016-05-13 DIAGNOSIS — M549 Dorsalgia, unspecified: Secondary | ICD-10-CM | POA: Insufficient documentation

## 2016-05-13 DIAGNOSIS — Z791 Long term (current) use of non-steroidal anti-inflammatories (NSAID): Secondary | ICD-10-CM | POA: Diagnosis not present

## 2016-05-13 DIAGNOSIS — F17210 Nicotine dependence, cigarettes, uncomplicated: Secondary | ICD-10-CM | POA: Diagnosis not present

## 2016-05-13 DIAGNOSIS — R109 Unspecified abdominal pain: Secondary | ICD-10-CM | POA: Insufficient documentation

## 2016-05-13 LAB — URINALYSIS, ROUTINE W REFLEX MICROSCOPIC
Bilirubin Urine: NEGATIVE
Glucose, UA: NEGATIVE mg/dL
Hgb urine dipstick: NEGATIVE
Ketones, ur: NEGATIVE mg/dL
LEUKOCYTES UA: NEGATIVE
NITRITE: NEGATIVE
PROTEIN: NEGATIVE mg/dL
Specific Gravity, Urine: 1.025 (ref 1.005–1.030)
pH: 5.5 (ref 5.0–8.0)

## 2016-05-13 LAB — I-STAT CHEM 8, ED
BUN: 11 mg/dL (ref 6–20)
CALCIUM ION: 1.16 mmol/L (ref 1.15–1.40)
CHLORIDE: 107 mmol/L (ref 101–111)
Creatinine, Ser: 0.5 mg/dL (ref 0.44–1.00)
Glucose, Bld: 87 mg/dL (ref 65–99)
HEMATOCRIT: 46 % (ref 36.0–46.0)
Hemoglobin: 15.6 g/dL — ABNORMAL HIGH (ref 12.0–15.0)
POTASSIUM: 3.2 mmol/L — AB (ref 3.5–5.1)
SODIUM: 143 mmol/L (ref 135–145)
TCO2: 24 mmol/L (ref 0–100)

## 2016-05-13 MED ORDER — ONDANSETRON 4 MG PO TBDP
4.0000 mg | ORAL_TABLET | Freq: Once | ORAL | Status: AC
  Administered 2016-05-13: 4 mg via ORAL
  Filled 2016-05-13: qty 1

## 2016-05-13 MED ORDER — CYCLOBENZAPRINE HCL 10 MG PO TABS: 10.0000 mg | ORAL_TABLET | Freq: Two times a day (BID) | ORAL | 0 refills | Status: DC | PRN

## 2016-05-13 MED ORDER — KETOROLAC TROMETHAMINE 60 MG/2ML IM SOLN
30.0000 mg | Freq: Once | INTRAMUSCULAR | Status: AC
  Administered 2016-05-13: 30 mg via INTRAMUSCULAR
  Filled 2016-05-13: qty 2

## 2016-05-13 MED ORDER — METHOCARBAMOL 500 MG PO TABS
500.0000 mg | ORAL_TABLET | Freq: Once | ORAL | Status: AC
  Administered 2016-05-13: 500 mg via ORAL
  Filled 2016-05-13: qty 1

## 2016-05-13 MED ORDER — IBUPROFEN 400 MG PO TABS
400.0000 mg | ORAL_TABLET | Freq: Four times a day (QID) | ORAL | 0 refills | Status: DC
Start: 2016-05-13 — End: 2016-10-08

## 2016-05-13 NOTE — ED Triage Notes (Signed)
Pt reports R flank pain that began last night, became much worse approx 10 min ago. Pt denies injury, denies urinary symptoms. Pt denies n/v.

## 2016-05-13 NOTE — ED Provider Notes (Signed)
[place] DEPT Provider Note   CSN: [id number] Arrival date & time: 05/13/16  1259     History   Chief Complaint Chief Complaint  Patient presents with  . Flank Pain    HPI Jacqueline Haney is a 60 y.o. female.  The history is provided by the patient. No language interpreter was used.  Flank Pain  This is a new problem. The current episode started yesterday. The problem occurs constantly. The problem has not changed since onset.Pertinent negatives include no chest pain, no abdominal pain, no headaches and no shortness of breath. Nothing aggravates the symptoms. Nothing relieves the symptoms. She has tried nothing for the symptoms. The treatment provided no relief.    Past Medical History:  Diagnosis Date  . Chest pain    ACS rules out stress test negative  . Hyperlipidemia    refuses meds/diet controlled  . Migraine     Patient Active Problem List   Diagnosis Date Noted  . Family history of coronary artery disease 09/18/2014  . Chest pain at rest 09/17/2014  . Obesity 09/17/2014  . Tobacco abuse 09/17/2014  . Chest pain 09/17/2014  . DERANGEMENT MENISCUS 09/16/2009  . KNEE PAIN 09/16/2009  . SHOULDER PAIN 01/10/2008  . CERVICAL RADICULITIS 01/10/2008  . CERVICAL SPASM 01/10/2008    Past Surgical History:  Procedure Laterality Date  . ABDOMINAL HYSTERECTOMY    . SPLENECTOMY      OB History    Gravida Para Term Preterm AB Living   2 2 2          SAB TAB Ectopic Multiple Live Births                   Home Medications    Prior to Admission medications   Medication Sig Start Date End Date Taking? Authorizing Provider  cyclobenzaprine (FLEXERIL) 10 MG tablet Take 1 tablet (10 mg total) by mouth 2 (two) times daily as needed for muscle spasms. 05/13/16   Merrily Pew, MD  HYDROcodone-acetaminophen (NORCO/VICODIN) 5-325 MG tablet Take 1 tablet by mouth every 6 (six) hours as needed for severe pain. Patient not taking: Reported on 05/13/2016 01/21/16   Isla Pence, MD  ibuprofen (ADVIL,MOTRIN) 400 MG tablet Take 1 tablet (400 mg total) by mouth 4 (four) times daily. 05/13/16   Merrily Pew, MD  ondansetron (ZOFRAN ODT) 4 MG disintegrating tablet Take 1 tablet (4 mg total) by mouth every 8 (eight) hours as needed. 06/22/15   Fredia Sorrow, MD  SUMAtriptan (IMITREX) 50 MG tablet Take 50 mg by mouth every 2 (two) hours as needed. 09/16/14   Historical Provider, MD    Family History Family History  Problem Relation Age of Onset  . Cancer Mother   . Cancer Father   . Kidney failure Sister   . Heart attack Sister   . Cancer Brother   . Cancer Other     Social History Social History  Substance Use Topics  . Smoking status: Current Every Day Smoker    Packs/day: 0.50    Types: Cigarettes  . Smokeless tobacco: Never Used  . Alcohol use No     Allergies   Aspirin; Codeine; Dilaudid [hydromorphone hcl]; Hydrocodone; Oxycodone-acetaminophen; and Tape   Review of Systems Review of Systems  Respiratory: Negative for shortness of breath.   Cardiovascular: Negative for chest pain.  Gastrointestinal: Negative for abdominal pain.  Genitourinary: Positive for flank pain.  Musculoskeletal: Positive for back pain (right flank/lower back).  Neurological: Negative for headaches.  All other systems reviewed and are negative.    Physical Exam Updated Vital Signs BP 102/79 (BP Location: Right Arm)   Pulse 62   Temp 97.5 F (36.4 C) (Axillary)   Resp 12   Ht 5\' 5"  (1.651 m)   Wt 166 lb 8 oz (75.5 kg)   SpO2 100%   BMI 27.71 kg/m   Physical Exam  Constitutional: She appears well-developed and well-nourished. No distress.  HENT:  Head: Normocephalic and atraumatic.  Eyes: Conjunctivae are normal.  Neck: Neck supple.  Cardiovascular: Normal rate and regular rhythm.   No murmur heard. Pulmonary/Chest: Effort normal and breath sounds normal. No respiratory distress.  Abdominal: Soft. There is no tenderness.  Musculoskeletal: She  exhibits tenderness (right lower back). She exhibits no edema or deformity.  Neurological: She is alert.  Skin: Skin is warm and dry.  Psychiatric: She has a normal mood and affect.  Nursing note and vitals reviewed.    ED Treatments / Results  Labs (all labs ordered are listed, but only abnormal results are displayed) Labs Reviewed  I-STAT CHEM 8, ED - Abnormal; Notable for the following:       Result Value   Potassium 3.2 (*)    Hemoglobin 15.6 (*)    All other components within normal limits  URINALYSIS, ROUTINE W REFLEX MICROSCOPIC (NOT AT [facility])    EKG  EKG Interpretation None       Radiology Ct Renal Stone Study  Result Date: 05/13/2016 CLINICAL DATA:  Worsening right flank pain. EXAM: CT ABDOMEN AND PELVIS WITHOUT CONTRAST TECHNIQUE: Multidetector CT imaging of the abdomen and pelvis was performed following the standard protocol without IV contrast. COMPARISON:  Body CT 05/16/2012 FINDINGS: Lower chest: Mild left lung base hypo ventilatory changes. Isolated subpleural left lung cyst. Hepatobiliary: No focal liver abnormality is seen. No gallstones, gallbladder wall thickening, or biliary dilatation. Pancreas: Unremarkable. No pancreatic ductal dilatation or surrounding inflammatory changes. Spleen: Surgically absent. Adrenals/Urinary Tract: There is a stable 1.8 cm left adrenal mass, and 1 cm right adrenal cortical thickening. Normal left kidney, given lack of IV contrast. 3 mm nonobstructive right inferior pole renal calculus. No evidence of hydronephrosis. Stomach/Bowel: Stomach is within normal limits. Appendix appears normal. No evidence of bowel wall thickening, distention, or inflammatory changes. Vascular/Lymphatic: Aortic atherosclerosis. No enlarged abdominal or pelvic lymph nodes. Reproductive: Status post hysterectomy. No adnexal masses. Other: Tiny midline infraumbilical fat containing anterior abdominal wall hernia. Musculoskeletal: No acute or significant osseous  findings. IMPRESSION: No evidence of obstructive uropathy. Nonobstructing 3 mm right lower pole renal calculus. Stable appearance of bilateral adrenal masses, incompletely visualized due to lack of IV contrast. Electronically Signed   By: Fidela Salisbury M.D.   On: 05/13/2016 15:41    Procedures Procedures (including critical care time)  Medications Ordered in ED Medications  methocarbamol (ROBAXIN) tablet 500 mg (500 mg Oral Given 05/13/16 1415)  ketorolac (TORADOL) injection 30 mg (30 mg Intramuscular Given 05/13/16 1415)  ondansetron (ZOFRAN-ODT) disintegrating tablet 4 mg (4 mg Oral Given 05/13/16 1543)     Initial Impression / Assessment and Plan / ED Course  I have reviewed the triage vital signs and the nursing notes.  Pertinent labs & imaging results that were available during my care of the patient were reviewed by me and considered in my medical decision making (see chart for details).  Clinical Course    Flank pain without obvious ureteral stone. Likely MSK. Will treat as same. Stable for discharge.   Final  Clinical Impressions(s) / ED Diagnoses   Final diagnoses:  Flank pain    New Prescriptions Discharge Medication List as of 05/13/2016  4:04 PM    START taking these medications   Details  cyclobenzaprine (FLEXERIL) 10 MG tablet Take 1 tablet (10 mg total) by mouth 2 (two) times daily as needed for muscle spasms., Starting Thu 05/13/2016, Print    ibuprofen (ADVIL,MOTRIN) 400 MG tablet Take 1 tablet (400 mg total) by mouth 4 (four) times daily., Starting Thu 05/13/2016, Print         Merrily Pew, MD 05/13/16 [phone]

## 2016-05-18 ENCOUNTER — Other Ambulatory Visit (HOSPITAL_COMMUNITY): Payer: Self-pay | Admitting: Registered Nurse

## 2016-05-18 DIAGNOSIS — Z1231 Encounter for screening mammogram for malignant neoplasm of breast: Secondary | ICD-10-CM

## 2016-05-21 ENCOUNTER — Ambulatory Visit (HOSPITAL_COMMUNITY)
Admission: RE | Admit: 2016-05-21 | Discharge: 2016-05-21 | Disposition: A | Discharge status: Home / Self Care | Payer: BC Managed Care – PPO | Source: Ambulatory Visit | Attending: Registered Nurse | Admitting: Registered Nurse

## 2016-05-21 DIAGNOSIS — Z1231 Encounter for screening mammogram for malignant neoplasm of breast: Secondary | ICD-10-CM | POA: Diagnosis not present

## 2016-06-11 ENCOUNTER — Encounter (HOSPITAL_COMMUNITY): Payer: Self-pay | Admitting: Emergency Medicine

## 2016-06-11 ENCOUNTER — Emergency Department (HOSPITAL_COMMUNITY)
Admission: EM | Admit: 2016-06-11 | Discharge: 2016-06-11 | Disposition: A | Discharge status: Home / Self Care | Payer: BC Managed Care – PPO | Attending: Emergency Medicine | Admitting: Emergency Medicine

## 2016-06-11 DIAGNOSIS — K047 Periapical abscess without sinus: Secondary | ICD-10-CM | POA: Diagnosis not present

## 2016-06-11 DIAGNOSIS — K0889 Other specified disorders of teeth and supporting structures: Secondary | ICD-10-CM | POA: Diagnosis present

## 2016-06-11 DIAGNOSIS — Z791 Long term (current) use of non-steroidal anti-inflammatories (NSAID): Secondary | ICD-10-CM | POA: Insufficient documentation

## 2016-06-11 DIAGNOSIS — Z79899 Other long term (current) drug therapy: Secondary | ICD-10-CM | POA: Diagnosis not present

## 2016-06-11 DIAGNOSIS — F17210 Nicotine dependence, cigarettes, uncomplicated: Secondary | ICD-10-CM | POA: Diagnosis not present

## 2016-06-11 MED ORDER — PENICILLIN V POTASSIUM 250 MG PO TABS
500.0000 mg | ORAL_TABLET | Freq: Once | ORAL | Status: AC
  Administered 2016-06-11: 500 mg via ORAL
  Filled 2016-06-11: qty 2

## 2016-06-11 MED ORDER — NAPROXEN 500 MG PO TABS: 500.0000 mg | ORAL_TABLET | Freq: Two times a day (BID) | ORAL | 0 refills | Status: DC

## 2016-06-11 NOTE — ED Triage Notes (Signed)
Pt c/o left lower dental pain and swelling since yesterday.

## 2016-06-11 NOTE — Discharge Instructions (Signed)
SEEK IMMEDIATE MEDICAL CARE IF: You have a fever or chills. Your symptoms suddenly get worse. You have a very bad headache. You have problems breathing or swallowing. You have trouble opening your mouth. You have swelling in your neck or around your eye.

## 2016-06-11 NOTE — ED Provider Notes (Signed)
[place] DEPT Provider Note   CSN: [id number] Arrival date & time: 06/11/16  1839     History   Chief Complaint Chief Complaint  Patient presents with  . Dental Pain    HPI Jacqueline Haney is a 60 y.o. female who presents emergency Department with chief complaint abdominal abscess. Patient states she is scheduled to have her tooth removed in 1 week. She states she has had a previous abscess at the same tooth. She complains of swelling, and pain by her left lower premolar. She denies difficulty swallowing, fevers, chills  HPI  Past Medical History:  Diagnosis Date  . Chest pain    ACS rules out stress test negative  . Hyperlipidemia    refuses meds/diet controlled  . Migraine     Patient Active Problem List   Diagnosis Date Noted  . Family history of coronary artery disease 09/18/2014  . Chest pain at rest 09/17/2014  . Obesity 09/17/2014  . Tobacco abuse 09/17/2014  . Chest pain 09/17/2014  . DERANGEMENT MENISCUS 09/16/2009  . KNEE PAIN 09/16/2009  . SHOULDER PAIN 01/10/2008  . CERVICAL RADICULITIS 01/10/2008  . CERVICAL SPASM 01/10/2008    Past Surgical History:  Procedure Laterality Date  . ABDOMINAL HYSTERECTOMY    . SPLENECTOMY      OB History    Gravida Para Term Preterm AB Living   2 2 2          SAB TAB Ectopic Multiple Live Births                   Home Medications    Prior to Admission medications   Medication Sig Start Date End Date Taking? Authorizing Provider  ibuprofen (ADVIL,MOTRIN) 400 MG tablet Take 1 tablet (400 mg total) by mouth 4 (four) times daily. Patient taking differently: Take 400 mg by mouth every 4 (four) hours as needed.  05/13/16  Yes Merrily Pew, MD  SUMAtriptan (IMITREX) 50 MG tablet Take 50 mg by mouth every 2 (two) hours as needed for migraine or headache.  09/16/14  Yes Historical Provider, MD  cyclobenzaprine (FLEXERIL) 10 MG tablet Take 1 tablet (10 mg total) by mouth 2 (two) times daily as needed for muscle  spasms. Patient not taking: Reported on 06/11/2016 05/13/16   Merrily Pew, MD  HYDROcodone-acetaminophen (NORCO/VICODIN) 5-325 MG tablet Take 1 tablet by mouth every 6 (six) hours as needed for severe pain. Patient not taking: Reported on 05/13/2016 01/21/16   Isla Pence, MD    Family History Family History  Problem Relation Age of Onset  . Cancer Mother   . Cancer Father   . Kidney failure Sister   . Heart attack Sister   . Cancer Brother   . Cancer Other     Social History Social History  Substance Use Topics  . Smoking status: Current Every Day Smoker    Packs/day: 0.50    Types: Cigarettes  . Smokeless tobacco: Never Used  . Alcohol use No     Allergies   Aspirin; Codeine; Dilaudid [hydromorphone hcl]; Hydrocodone; Oxycodone-acetaminophen; and Tape   Review of Systems Review of Systems  Constitutional: Negative for chills and fever.  HENT: Positive for dental problem. Negative for trouble swallowing.      Physical Exam Updated Vital Signs BP 125/84 (BP Location: Left Arm)   Pulse 93   Temp 99.3 F (37.4 C) (Oral)   Resp 20   Ht 5\' 5"  (1.651 m)   Wt 76.2 kg   SpO2 97%  BMI 27.96 kg/m   Physical Exam  Constitutional: She is oriented to person, place, and time. She appears well-developed and well-nourished. No distress.  HENT:  Head: Normocephalic and atraumatic.  Mouth/Throat: Oropharynx is clear and moist.    Eyes: Conjunctivae are normal. No scleral icterus.  Neck: Normal range of motion.  Cardiovascular: Normal rate, regular rhythm and normal heart sounds.  Exam reveals no gallop and no friction rub.   No murmur heard. Pulmonary/Chest: Effort normal and breath sounds normal. No respiratory distress.  Abdominal: Soft. Bowel sounds are normal. She exhibits no distension and no mass. There is no tenderness. There is no guarding.  Neurological: She is alert and oriented to person, place, and time.  Skin: Skin is warm and dry. She is not  diaphoretic.  Nursing note and vitals reviewed.    ED Treatments / Results  Labs (all labs ordered are listed, but only abnormal results are displayed) Labs Reviewed - No data to display  EKG  EKG Interpretation None       Radiology No results found.  Procedures Procedures (including critical care time)  Medications Ordered in ED Medications - No data to display   Initial Impression / Assessment and Plan / ED Course  I have reviewed the triage vital signs and the nursing notes.  Pertinent labs & imaging results that were available during my care of the patient were reviewed by me and considered in my medical decision making (see chart for details).  Clinical Course    Patient with dental abscess, no sign of Ludwig's angina or extension of the abscess into the pharyngeal spaces. Patient will be discharged with naproxen and penicillin. She appears safe for discharge at this time. Patient has a follow-up for with her dentist next week. Precautions and reasons to see her dentist sooner. Final Clinical Impressions(s) / ED Diagnoses   Final diagnoses:  None    New Prescriptions New Prescriptions   No medications on file     Margarita Mail, PA-C 06/11/16 East[place] Liu, MD 06/11/16 2351

## 2016-09-30 ENCOUNTER — Emergency Department (HOSPITAL_COMMUNITY)
Admission: EM | Admit: 2016-09-30 | Discharge: 2016-09-30 | Disposition: A | Discharge status: Home / Self Care | Payer: BC Managed Care – PPO | Attending: Emergency Medicine | Admitting: Emergency Medicine

## 2016-09-30 ENCOUNTER — Emergency Department (HOSPITAL_COMMUNITY): Payer: BC Managed Care – PPO

## 2016-09-30 ENCOUNTER — Encounter (HOSPITAL_COMMUNITY): Payer: Self-pay | Admitting: Emergency Medicine

## 2016-09-30 DIAGNOSIS — R1012 Left upper quadrant pain: Secondary | ICD-10-CM | POA: Diagnosis present

## 2016-09-30 DIAGNOSIS — N281 Cyst of kidney, acquired: Secondary | ICD-10-CM | POA: Insufficient documentation

## 2016-09-30 DIAGNOSIS — Z791 Long term (current) use of non-steroidal anti-inflammatories (NSAID): Secondary | ICD-10-CM | POA: Insufficient documentation

## 2016-09-30 DIAGNOSIS — F17210 Nicotine dependence, cigarettes, uncomplicated: Secondary | ICD-10-CM | POA: Diagnosis not present

## 2016-09-30 LAB — COMPREHENSIVE METABOLIC PANEL
ALBUMIN: 4.1 g/dL (ref 3.5–5.0)
ALT: 40 U/L (ref 14–54)
AST: 33 U/L (ref 15–41)
Alkaline Phosphatase: 80 U/L (ref 38–126)
Anion gap: 7 (ref 5–15)
BUN: 14 mg/dL (ref 6–20)
CHLORIDE: 105 mmol/L (ref 101–111)
CO2: 28 mmol/L (ref 22–32)
Calcium: 9.5 mg/dL (ref 8.9–10.3)
Creatinine, Ser: 0.58 mg/dL (ref 0.44–1.00)
GFR calc Af Amer: >60 mL/min (ref >60)
GFR calc non Af Amer: >60 mL/min (ref >60)
GLUCOSE: 101 mg/dL — AB (ref 65–99)
Potassium: 4.8 mmol/L (ref 3.5–5.1)
SODIUM: 140 mmol/L (ref 135–145)
Total Bilirubin: 0.5 mg/dL (ref 0.3–1.2)
Total Protein: 7.6 g/dL (ref 6.5–8.1)

## 2016-09-30 LAB — CBC WITH DIFFERENTIAL/PLATELET
BASOS ABS: 0 10*3/uL (ref 0.0–0.1)
Basophils Relative: 0 %
Eosinophils Absolute: 0.2 10*3/uL (ref 0.0–0.7)
Eosinophils Relative: 1 %
HEMATOCRIT: 42.8 % (ref 36.0–46.0)
HEMOGLOBIN: 14.5 g/dL (ref 12.0–15.0)
LYMPHS PCT: 36 %
Lymphs Abs: 4.1 10*3/uL — ABNORMAL HIGH (ref 0.7–4.0)
MCH: 29 pg (ref 26.0–34.0)
MCHC: 33.9 g/dL (ref 30.0–36.0)
MCV: 85.6 fL (ref 78.0–100.0)
MONO ABS: 1.2 10*3/uL — AB (ref 0.1–1.0)
MONOS PCT: 10 %
NEUTROS ABS: 6 10*3/uL (ref 1.7–7.7)
Neutrophils Relative %: 53 %
Platelets: 241 10*3/uL (ref 150–400)
RBC: 5 MIL/uL (ref 3.87–5.11)
RDW: 14.4 % (ref 11.5–15.5)
WBC: 11.5 10*3/uL — ABNORMAL HIGH (ref 4.0–10.5)

## 2016-09-30 LAB — URINALYSIS, ROUTINE W REFLEX MICROSCOPIC
BACTERIA UA: NONE SEEN
Bilirubin Urine: NEGATIVE
GLUCOSE, UA: NEGATIVE mg/dL
Ketones, ur: NEGATIVE mg/dL
LEUKOCYTES UA: NEGATIVE
Nitrite: NEGATIVE
PROTEIN: NEGATIVE mg/dL
Specific Gravity, Urine: 1.025 (ref 1.005–1.030)
pH: 6 (ref 5.0–8.0)

## 2016-09-30 LAB — LIPASE, BLOOD: Lipase: 34 U/L (ref 11–51)

## 2016-09-30 MED ORDER — TRAMADOL HCL 50 MG PO TABS: 50.0000 mg | ORAL_TABLET | Freq: Four times a day (QID) | ORAL | 0 refills | Status: DC | PRN

## 2016-09-30 MED ORDER — METHOCARBAMOL 500 MG PO TABS: 500.0000 mg | ORAL_TABLET | Freq: Three times a day (TID) | ORAL | 0 refills | Status: DC

## 2016-09-30 MED ORDER — IOPAMIDOL (ISOVUE-300) INJECTION 61%
INTRAVENOUS | Status: AC
  Administered 2016-09-30: 30 mL
  Filled 2016-09-30: qty 30

## 2016-09-30 MED ORDER — IOPAMIDOL (ISOVUE-300) INJECTION 61%
100.0000 mL | Freq: Once | INTRAVENOUS | Status: AC | PRN
  Administered 2016-09-30: 100 mL via INTRAVENOUS

## 2016-09-30 NOTE — ED Triage Notes (Signed)
Injury to left lateral back, lifting heavy boxes yesterday.  Rates pain 10/10.  Took 2 Motrin is am.

## 2016-09-30 NOTE — ED Provider Notes (Signed)
[place] DEPT Provider Note   CSN: [id number] Arrival date & time: 09/30/16  1101     History   Chief Complaint Chief Complaint  Patient presents with  . Flank Pain    left    HPI Jacqueline Haney is a 61 y.o. female.  HPI   Jacqueline Haney is a 61 y.o. 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 She reports lifting heavy boxes yesterday at her job. She describes the pain as constant she also states that her left upper abdomen appears "swollen. " Pain described as constant and worse with cough.  She states she told "years ago" when her spleen was removed that she also has a "cyst" on her left side, but she doesn't know the origin or exact location.  She took ibuprofen this morning with some relief.  She denies fever, chills, back pain, dysuria, vomiting, chest pain or shortness of breath.   Past Medical History:  Diagnosis Date  . Chest pain    ACS rules out stress test negative  . Hyperlipidemia    refuses meds/diet controlled  . Migraine     Patient Active Problem List   Diagnosis Date Noted  . Family history of coronary artery disease 09/18/2014  . Chest pain at rest 09/17/2014  . Obesity 09/17/2014  . Tobacco abuse 09/17/2014  . Chest pain 09/17/2014  . DERANGEMENT MENISCUS 09/16/2009  . KNEE PAIN 09/16/2009  . SHOULDER PAIN 01/10/2008  . CERVICAL RADICULITIS 01/10/2008  . CERVICAL SPASM 01/10/2008    Past Surgical History:  Procedure Laterality Date  . ABDOMINAL HYSTERECTOMY    . SPLENECTOMY      OB History    Gravida Para Term Preterm AB Living   2 2 2          SAB TAB Ectopic Multiple Live Births                   Home Medications    Prior to Admission medications   Medication Sig Start Date End Date Taking? Authorizing Provider  ibuprofen (ADVIL,MOTRIN) 400 MG tablet Take 1 tablet (400 mg total) by mouth 4 (four) times daily. Patient taking differently: Take  400 mg by mouth every 4 (four) hours as needed.  05/13/16  Yes Merrily Pew, MD  SUMAtriptan (IMITREX) 50 MG tablet Take 50 mg by mouth every 2 (two) hours as needed for migraine or headache.  09/16/14  Yes Historical Provider, MD    Family History Family History  Problem Relation Age of Onset  . Cancer Mother   . Cancer Father   . Kidney failure Sister   . Heart attack Sister   . Cancer Brother   . Cancer Other     Social History Social History  Substance Use Topics  . Smoking status: Current Every Day Smoker    Packs/day: 0.50    Types: Cigarettes  . Smokeless tobacco: Never Used  . Alcohol use No     Allergies   Aspirin; Codeine; Dilaudid [hydromorphone hcl]; Hydrocodone; Oxycodone-acetaminophen; and Tape   Review of Systems Review of Systems  Constitutional: Negative for appetite change, chills and fever.  Respiratory: Negative for chest tightness and shortness of breath.   Cardiovascular: Negative for chest pain.  Gastrointestinal: Positive for abdominal pain. Negative for blood in stool, nausea and vomiting.  Genitourinary: Negative for decreased urine volume, difficulty urinating, dysuria and flank pain.  Musculoskeletal: Negative for back pain.  Skin: Negative for color change and rash.  Neurological: Negative for dizziness, weakness and numbness.  Hematological: Negative for adenopathy.  All other systems reviewed and are negative.    Physical Exam Updated Vital Signs BP 125/79 (BP Location: Left Arm)   Pulse 68   Temp 97.7 F (36.5 C) (Oral)   Resp 16   Ht 5\' 6"  (1.676 m)   Wt 78.9 kg   SpO2 98%   BMI 28.08 kg/m   Physical Exam  Constitutional: She is oriented to person, place, and time. She appears well-developed and well-nourished. No distress.  HENT:  Head: Atraumatic.  Mouth/Throat: Oropharynx is clear and moist.  Neck: Normal range of motion.  Cardiovascular: Normal rate, regular rhythm and normal heart sounds.   No murmur  heard. Pulmonary/Chest: Effort normal. No respiratory distress. She exhibits no tenderness.  Abdominal: Soft. She exhibits no distension and no mass. There is tenderness. There is no guarding.  ttp of the LUQ.  Tender over splenectomy scar.  No erythema or palpable mass  Musculoskeletal: Normal range of motion.  Neurological: She is alert and oriented to person, place, and time.  Skin: Skin is warm. No rash noted.  Nursing note and vitals reviewed.    ED Treatments / Results  Labs (all labs ordered are listed, but only abnormal results are displayed) Labs Reviewed  COMPREHENSIVE METABOLIC PANEL - Abnormal; Notable for the following:       Result Value   Glucose, Bld 101 (*)    All other components within normal limits  CBC WITH DIFFERENTIAL/PLATELET - Abnormal; Notable for the following:    WBC 11.5 (*)    Lymphs Abs 4.1 (*)    Monocytes Absolute 1.2 (*)    All other components within normal limits  URINALYSIS, ROUTINE W REFLEX MICROSCOPIC - Abnormal; Notable for the following:    Color, Urine STRAW (*)    Hgb urine dipstick SMALL (*)    All other components within normal limits  LIPASE, BLOOD    EKG  EKG Interpretation None       Radiology Ct Abdomen Pelvis W Contrast  Result Date: 09/30/2016 CLINICAL DATA:  Acute left flank pain. EXAM: CT ABDOMEN AND PELVIS WITH CONTRAST TECHNIQUE: Multidetector CT imaging of the abdomen and pelvis was performed using the standard protocol following bolus administration of intravenous contrast. CONTRAST:  3mL ISOVUE-300 IOPAMIDOL (ISOVUE-300) INJECTION 61%, 176mL ISOVUE-300 IOPAMIDOL (ISOVUE-300) INJECTION 61% COMPARISON:  CT scan of May 13, 2016. FINDINGS: Lower chest: No acute abnormality. Hepatobiliary: No focal liver abnormality is seen. No gallstones, gallbladder wall thickening, or biliary dilatation. Pancreas: Unremarkable. No pancreatic ductal dilatation or surrounding inflammatory changes. Spleen: Status post splenectomy.  Adrenals/Urinary Tract: Stable bilateral adrenal nodules. A small nonobstructive right renal calculus is noted. No hydronephrosis or renal obstruction is noted. Left renal cyst is noted. Urinary bladder is unremarkable. Stomach/Bowel: Stomach is within normal limits. Appendix appears normal. No evidence of bowel wall thickening, distention, or inflammatory changes. Vascular/Lymphatic: Aortic atherosclerosis. No enlarged abdominal or pelvic lymph nodes. Reproductive: Status post hysterectomy. No adnexal masses. Other: No abdominal wall hernia or abnormality. No abdominopelvic ascites. Musculoskeletal: No acute or significant osseous findings. IMPRESSION: Aortic atherosclerosis. Stable small nonobstructive right renal calculus. No hydronephrosis or renal obstruction is noted. 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 No other significant abnormality seen in the abdomen or pelvis. Electronically Signed   By: Marijo Conception, M.D.   On: 09/30/2016 13:43     Procedures Procedures (including  critical care time)  Medications Ordered in ED Medications  iopamidol (ISOVUE-300) 61 % injection (not administered)     Initial Impression / Assessment and Plan / ED Course  I have reviewed the triage vital signs and the nursing notes.  Pertinent labs & imaging results that were available during my care of the patient were reviewed by me and considered in my medical decision making (see chart for details).     Pt is feeling better after medications.  CT A/P reassuring.  Pt advised of renal cyst finding.  Doubt acute abdomen. Urology f/u recommended, pt verbalized understanding and agrees to plan.  Appears stable for d/c  Final Clinical Impressions(s) / ED Diagnoses   Final diagnoses:  Left upper quadrant pain  Renal cyst, left    New Prescriptions Discharge Medication List as of 09/30/2016  2:24 PM    START taking these medications   Details  methocarbamol (ROBAXIN) 500  MG tablet Take 1 tablet (500 mg total) by mouth 3 (three) times daily., Starting Thu 09/30/2016, Print    traMADol (ULTRAM) 50 MG tablet Take 1 tablet (50 mg total) by mouth every 6 (six) hours as needed., Starting Thu 09/30/2016, Print         Axtyn Woehler [place], PA-C 10/03/16 1541    Milton Ferguson, MD 10/04/16 1542

## 2016-09-30 NOTE — Discharge Instructions (Signed)
Follow-up with your primary doctor.  You can also contact the urology group listed to arrange a follow-up regarding the cyst on your kidney

## 2016-09-30 NOTE — ED Notes (Signed)
nad noted prior to dc. Dc instructions reviewed and explained. Voiced understanding. 2 rx were given to pt.

## 2016-10-08 ENCOUNTER — Emergency Department (HOSPITAL_COMMUNITY): Payer: BC Managed Care – PPO

## 2016-10-08 ENCOUNTER — Encounter (HOSPITAL_COMMUNITY): Payer: Self-pay | Admitting: Emergency Medicine

## 2016-10-08 ENCOUNTER — Emergency Department (HOSPITAL_COMMUNITY)
Admission: EM | Admit: 2016-10-08 | Discharge: 2016-10-08 | Disposition: A | Discharge status: Home / Self Care | Payer: BC Managed Care – PPO | Attending: Emergency Medicine | Admitting: Emergency Medicine

## 2016-10-08 DIAGNOSIS — Y939 Activity, unspecified: Secondary | ICD-10-CM | POA: Diagnosis not present

## 2016-10-08 DIAGNOSIS — T148XXA Other injury of unspecified body region, initial encounter: Secondary | ICD-10-CM

## 2016-10-08 DIAGNOSIS — F17210 Nicotine dependence, cigarettes, uncomplicated: Secondary | ICD-10-CM | POA: Insufficient documentation

## 2016-10-08 DIAGNOSIS — Y929 Unspecified place or not applicable: Secondary | ICD-10-CM | POA: Insufficient documentation

## 2016-10-08 DIAGNOSIS — X58XXXA Exposure to other specified factors, initial encounter: Secondary | ICD-10-CM | POA: Insufficient documentation

## 2016-10-08 DIAGNOSIS — Y999 Unspecified external cause status: Secondary | ICD-10-CM | POA: Diagnosis not present

## 2016-10-08 DIAGNOSIS — R109 Unspecified abdominal pain: Secondary | ICD-10-CM | POA: Diagnosis not present

## 2016-10-08 DIAGNOSIS — S299XXA Unspecified injury of thorax, initial encounter: Secondary | ICD-10-CM | POA: Diagnosis present

## 2016-10-08 DIAGNOSIS — S29011A Strain of muscle and tendon of front wall of thorax, initial encounter: Secondary | ICD-10-CM | POA: Insufficient documentation

## 2016-10-08 MED ORDER — METHOCARBAMOL 500 MG PO TABS
1000.0000 mg | ORAL_TABLET | Freq: Once | ORAL | Status: AC
  Administered 2016-10-08: 1000 mg via ORAL
  Filled 2016-10-08: qty 2

## 2016-10-08 MED ORDER — SODIUM CHLORIDE 0.9 % IV BOLUS (SEPSIS)
1000.0000 mL | Freq: Once | INTRAVENOUS | Status: AC
  Administered 2016-10-08: 1000 mL via INTRAVENOUS

## 2016-10-08 MED ORDER — KETOROLAC TROMETHAMINE 30 MG/ML IJ SOLN
30.0000 mg | Freq: Once | INTRAMUSCULAR | Status: AC
  Administered 2016-10-08: 30 mg via INTRAVENOUS
  Filled 2016-10-08: qty 1

## 2016-10-08 MED ORDER — IBUPROFEN 600 MG PO TABS: 600.0000 mg | ORAL_TABLET | Freq: Four times a day (QID) | ORAL | 0 refills | Status: DC | PRN

## 2016-10-08 MED ORDER — ONDANSETRON HCL 4 MG/2ML IJ SOLN
4.0000 mg | Freq: Once | INTRAMUSCULAR | Status: AC
  Administered 2016-10-08: 4 mg via INTRAVENOUS
  Filled 2016-10-08: qty 2

## 2016-10-08 NOTE — Discharge Instructions (Signed)
Your xray was normal Ibuprofen 3 times daily for pain Heat packs See your doctor in follow up - see list below  [facility] Primary Care Doctor List    Sinda Du MD. Specialty: Pulmonary Disease Contact information: [place] [place]  [place] [address]  [phone]   Tula Nakayama, MD. Specialty: [facility] Medicine Contact information: [address] [place] [address]  [phone]   Sallee Lange, MD. Specialty: [facility] Medicine Contact information: [place]  [place] [address]  [phone]   Rosita Fire, MD Specialty: Internal Medicine Contact information: [place] [address]  [phone]   Delphina Cahill, MD. Specialty: Internal Medicine Contact information: [place] [address]  [phone]   Marjean Donna, MD. Specialty: Family Medicine Contact information: [place] [address]  [phone]   Leslie Andrea, MD. Specialty: [facility] Medicine Contact information: [place] [address]  [phone]   Asencion Noble, MD. Specialty: Internal Medicine Contact information: [place]  [place] [address]  [phone]

## 2016-10-08 NOTE — ED Triage Notes (Signed)
PT states lifting strain injury to left upper side on 09/30/16 and states pain had gotten better and pt returned to work and pain started back to same area and pt pain to left rib cage to touch.

## 2016-10-08 NOTE — ED Provider Notes (Signed)
[facility] DEPT Provider Note   CSN: [id number] Arrival date & time: 10/08/16  0807     History   Chief Complaint Chief Complaint  Patient presents with  . Abdominal Pain    HPI CATHALINA DAMP is a 61 y.o. female.  HPI  The patient is a 61 year old female, she has a history of multiple medical problems including hyperlipidemia, history of splenectomy and a history of acute onset of pain in her left side which occurred on the 15th approximately 8 days ago. Since that time she was seen in the emergency department, had a CT scan of the abdomen and pelvis which was unremarkable and showed no acute findings. She had improved somewhat, she was given prescriptions for some pain medications but said she did not tolerate very well because it made her nauseated. 2 days ago she developed recurrent pain in the same location, this is worse with moving, bending, twisting or palpation over the left lateral chest wall on the lower ribs. She does have minimal pain with deep breathing but has no coughing, no fever, no vomiting, no other symptoms including swelling of the legs. She reports that it was when she went back to work and was mopping that she developed a recurrent pain. She has not had any relief with her home medications. This is similar to the pain that she had 8 days ago.  Past Medical History:  Diagnosis Date  . Chest pain    ACS rules out stress test negative  . Hyperlipidemia    refuses meds/diet controlled  . Migraine     Patient Active Problem List   Diagnosis Date Noted  . Family history of coronary artery disease 09/18/2014  . Chest pain at rest 09/17/2014  . Obesity 09/17/2014  . Tobacco abuse 09/17/2014  . Chest pain 09/17/2014  . DERANGEMENT MENISCUS 09/16/2009  . KNEE PAIN 09/16/2009  . SHOULDER PAIN 01/10/2008  . CERVICAL RADICULITIS 01/10/2008  . CERVICAL SPASM 01/10/2008    Past Surgical History:  Procedure Laterality Date  . ABDOMINAL HYSTERECTOMY    .  SPLENECTOMY      OB History    Gravida Para Term Preterm AB Living   2 2 2          SAB TAB Ectopic Multiple Live Births                   Home Medications    Prior to Admission medications   Medication Sig Start Date End Date Taking? Authorizing Provider  ibuprofen (ADVIL,MOTRIN) 600 MG tablet Take 1 tablet (600 mg total) by mouth every 6 (six) hours as needed. 10/08/16   Noemi Chapel, MD  methocarbamol (ROBAXIN) 500 MG tablet Take 1 tablet (500 mg total) by mouth 3 (three) times daily. 09/30/16   Tammy Triplett, PA-C  SUMAtriptan (IMITREX) 50 MG tablet Take 50 mg by mouth every 2 (two) hours as needed for migraine or headache.  09/16/14   Historical Provider, MD  traMADol (ULTRAM) 50 MG tablet Take 1 tablet (50 mg total) by mouth every 6 (six) hours as needed. 09/30/16   Tammy Triplett, PA-C    Family History Family History  Problem Relation Age of Onset  . Cancer Mother   . Cancer Father   . Kidney failure Sister   . Heart attack Sister   . Cancer Brother   . Cancer Other     Social History Social History  Substance Use Topics  . Smoking status: Current Every Day Smoker  Packs/day: 0.50    Types: Cigarettes  . Smokeless tobacco: Never Used  . Alcohol use No     Allergies   Aspirin; Codeine; Dilaudid [hydromorphone hcl]; Hydrocodone; Oxycodone-acetaminophen; and Tape   Review of Systems Review of Systems  All other systems reviewed and are negative.    Physical Exam Updated Vital Signs BP 122/81   Pulse (!) 58   Temp 98.1 F (36.7 C) (Oral)   Resp 16   Ht 5\' 6"  (1.676 m)   Wt 174 lb (78.9 kg)   SpO2 98%   BMI 28.08 kg/m   Physical Exam  Constitutional: She appears well-developed and well-nourished. No distress.  HENT:  Head: Normocephalic and atraumatic.  Mouth/Throat: Oropharynx is clear and moist. No oropharyngeal exudate.  Eyes: Conjunctivae and EOM are normal. Pupils are equal, round, and reactive to light. Right eye exhibits no discharge.  Left eye exhibits no discharge. No scleral icterus.  Neck: Normal range of motion. Neck supple. No JVD present. No thyromegaly present.  Cardiovascular: Normal rate, regular rhythm, normal heart sounds and intact distal pulses.  Exam reveals no gallop and no friction rub.   No murmur heard. Pulmonary/Chest: Effort normal and breath sounds normal. No respiratory distress. She has no wheezes. She has no rales. She exhibits tenderness.  Abdominal: Soft. Bowel sounds are normal. She exhibits no distension and no mass. There is no tenderness.  No abdominal tenderness to palpation  Musculoskeletal: Normal range of motion. She exhibits tenderness. She exhibits no edema.  There is tenderness to palpation over the left lateral lower chest wall. There is no signs of rash, there is no crepitance or subcutaneous emphysema, there is no posterior rib or spinal tenderness, there is no anterior chest wall tenderness  Lymphadenopathy:    She has no cervical adenopathy.  Neurological: She is alert. Coordination normal.  Normal strength and sensation of the bilateral lower and upper extremities  Skin: Skin is warm and dry. No rash noted. No erythema.  Psychiatric: She has a normal mood and affect. Her behavior is normal.  Nursing note and vitals reviewed.    ED Treatments / Results  Labs (all labs ordered are listed, but only abnormal results are displayed) Labs Reviewed - No data to display   Radiology Dg Chest [facility] 1 View  Result Date: 10/08/2016 CLINICAL DATA:  Left-sided chest pain. EXAM: PORTABLE CHEST 1 VIEW COMPARISON:  09/17/2014 FINDINGS: Hazy densities in the lower chest probably related to overlying soft tissue. Otherwise, the lungs are clear. Heart and mediastinum are within normal limits. The trachea is midline. Bony thorax is intact. Negative for a pneumothorax. IMPRESSION: No active disease. Electronically Signed   By: Markus Daft M.D.   On: 10/08/2016 10:24    Procedures Procedures  (including critical care time)  Medications Ordered in ED Medications  ketorolac (TORADOL) 30 MG/ML injection 30 mg (30 mg Intravenous Given 10/08/16 0851)  methocarbamol (ROBAXIN) tablet 1,000 mg (1,000 mg Oral Given 10/08/16 0851)  sodium chloride 0.9 % bolus 1,000 mL (0 mLs Intravenous Stopped 10/08/16 1000)  ondansetron (ZOFRAN) injection 4 mg (4 mg Intravenous Given 10/08/16 0851)     Initial Impression / Assessment and Plan / ED Course  I have reviewed the triage vital signs and the nursing notes.  Pertinent labs & imaging results that were available during my care of the patient were reviewed by me and considered in my medical decision making (see chart for details).    The patient clearly has reproducible tenderness over  the ribs. There is been at CT scan that has evaluated this area approximately 8 days ago and there were no acute findings. Her pain is very clearly made worse with any palpation over the chest wall or any movement. She has difficulty even getting from the wheelchair to the bed because any acute movement causes reproducible acute pain. This is all consistent with muscular pain. She will be given intravenous medication as well as a muscle relaxant, we will place a heating pad on this area. I will obtain a chest x-ray to make sure there is no other underlying issues with ribs or lung, this does not appear to be cardiac or abdominal in nature. The CT scan was reviewed in its entirety.    Final Clinical Impressions(s) / ED Diagnoses   Final diagnoses:  Muscle strain    New Prescriptions New Prescriptions   IBUPROFEN (ADVIL,MOTRIN) 600 MG TABLET    Take 1 tablet (600 mg total) by mouth every 6 (six) hours as needed.     Noemi Chapel, MD 10/08/16 [phone]

## 2016-12-01 ENCOUNTER — Ambulatory Visit: Payer: Self-pay | Admitting: Urology

## 2017-08-11 ENCOUNTER — Emergency Department (HOSPITAL_COMMUNITY)
Admission: EM | Admit: 2017-08-11 | Discharge: 2017-08-11 | Disposition: A | Discharge status: Home / Self Care | Payer: BC Managed Care – PPO | Attending: Emergency Medicine | Admitting: Emergency Medicine

## 2017-08-11 ENCOUNTER — Other Ambulatory Visit: Payer: Self-pay

## 2017-08-11 ENCOUNTER — Encounter (HOSPITAL_COMMUNITY): Payer: Self-pay

## 2017-08-11 DIAGNOSIS — Y999 Unspecified external cause status: Secondary | ICD-10-CM | POA: Insufficient documentation

## 2017-08-11 DIAGNOSIS — S39012A Strain of muscle, fascia and tendon of lower back, initial encounter: Secondary | ICD-10-CM | POA: Diagnosis not present

## 2017-08-11 DIAGNOSIS — Y929 Unspecified place or not applicable: Secondary | ICD-10-CM | POA: Diagnosis not present

## 2017-08-11 DIAGNOSIS — X58XXXA Exposure to other specified factors, initial encounter: Secondary | ICD-10-CM | POA: Diagnosis not present

## 2017-08-11 DIAGNOSIS — Y939 Activity, unspecified: Secondary | ICD-10-CM | POA: Diagnosis not present

## 2017-08-11 DIAGNOSIS — M25532 Pain in left wrist: Secondary | ICD-10-CM | POA: Insufficient documentation

## 2017-08-11 DIAGNOSIS — F17210 Nicotine dependence, cigarettes, uncomplicated: Secondary | ICD-10-CM | POA: Diagnosis not present

## 2017-08-11 DIAGNOSIS — Z79899 Other long term (current) drug therapy: Secondary | ICD-10-CM | POA: Diagnosis not present

## 2017-08-11 MED ORDER — KETOROLAC TROMETHAMINE 60 MG/2ML IM SOLN
60.0000 mg | Freq: Once | INTRAMUSCULAR | Status: AC
  Administered 2017-08-11: 60 mg via INTRAMUSCULAR
  Filled 2017-08-11: qty 2

## 2017-08-11 NOTE — ED Provider Notes (Signed)
[facility] EMERGENCY DEPARTMENT Provider Note   CSN: [id number] Arrival date & time: 08/11/17  1137     History   Chief Complaint Chief Complaint  Patient presents with  . Back Pain  . Extremity Pain    Left arm    HPI Jacqueline Haney is a 61 y.o. female.  HPI   Jacqueline Haney is a 61 y.o. female who presents to the Emergency Department complaining of diffuse lower back pain and left wrist pain.  Pain is been present for several days.  She states that her wrist and back began hurting after cleaning several buckets of chitterlings.  She describes a throbbing pain across her lower back that is associated with movement, sitting and bending.  Pain to her back improves with standning.  She has not tried any therapies or medications for her symptoms.  She describes pain to her wrist associated with twisting and flexing her wrist.  She denies pain to her elbow or fingers, redness, trauma, numbness or weakness.  Also denies pain, numbness or weakness to her lower extremities, abdominal pain, urine or bowel changes,and fever.  No saddle anesthesias.     Past Medical History:  Diagnosis Date  . Chest pain    ACS rules out stress test negative  . Hyperlipidemia    refuses meds/diet controlled  . Migraine     Patient Active Problem List   Diagnosis Date Noted  . Family history of coronary artery disease 09/18/2014  . Chest pain at rest 09/17/2014  . Obesity 09/17/2014  . Tobacco abuse 09/17/2014  . Chest pain 09/17/2014  . DERANGEMENT MENISCUS 09/16/2009  . KNEE PAIN 09/16/2009  . SHOULDER PAIN 01/10/2008  . CERVICAL RADICULITIS 01/10/2008  . CERVICAL SPASM 01/10/2008    Past Surgical History:  Procedure Laterality Date  . ABDOMINAL HYSTERECTOMY    . SPLENECTOMY      OB History    Gravida Para Term Preterm AB Living   2 2 2          SAB TAB Ectopic Multiple Live Births                   Home Medications    Prior to Admission medications   Medication Sig  Start Date End Date Taking? Authorizing Provider  ibuprofen (ADVIL,MOTRIN) 600 MG tablet Take 1 tablet (600 mg total) by mouth every 6 (six) hours as needed. 10/08/16   Noemi Chapel, MD  methocarbamol (ROBAXIN) 500 MG tablet Take 1 tablet (500 mg total) by mouth 3 (three) times daily. 09/30/16   Jalien Weakland, PA-C  SUMAtriptan (IMITREX) 50 MG tablet Take 50 mg by mouth every 2 (two) hours as needed for migraine or headache.  09/16/14   [provider]  traMADol (ULTRAM) 50 MG tablet Take 1 tablet (50 mg total) by mouth every 6 (six) hours as needed. 09/30/16   Kem Parkinson, PA-C    Family History Family History  Problem Relation Age of Onset  . Cancer Mother   . Cancer Father   . Kidney failure Sister   . Heart attack Sister   . Cancer Brother   . Cancer Other     Social History Social History   Tobacco Use  . Smoking status: Current Every Day Smoker    Packs/day: 0.50    Types: Cigarettes  . Smokeless tobacco: Never Used  Substance Use Topics  . Alcohol use: No  . Drug use: No     Allergies   Aspirin; Codeine;  Dilaudid [hydromorphone hcl]; Hydrocodone; Oxycodone-acetaminophen; and Tape   Review of Systems Review of Systems  Constitutional: Negative for fever.  Respiratory: Negative for shortness of breath.   Cardiovascular: Negative for chest pain.  Gastrointestinal: Negative for abdominal pain, constipation and vomiting.  Genitourinary: Negative for decreased urine volume, difficulty urinating, dysuria, flank pain and hematuria.  Musculoskeletal: Positive for arthralgias (left wrist pain) and back pain. Negative for joint swelling and neck pain.  Skin: Negative for color change, rash and wound.  Neurological: Negative for weakness and numbness.  All other systems reviewed and are negative.    Physical Exam Updated Vital Signs BP 129/83 (BP Location: Right Arm)   Pulse 79   Temp 97.7 F (36.5 C) (Oral)   Resp 18   Ht 5\' 6"  (1.676 m)   Wt 78.9 kg  (174 lb)   SpO2 100%   BMI 28.08 kg/m   Physical Exam  Constitutional: She is oriented to person, place, and time. She appears well-developed and well-nourished. No distress.  HENT:  Head: Normocephalic and atraumatic.  Neck: Normal range of motion. Neck supple.  Cardiovascular: Normal rate, regular rhythm and intact distal pulses.  No murmur heard. DP pulses are strong and palpable bilaterally  Pulmonary/Chest: Effort normal and breath sounds normal. No respiratory distress.  Abdominal: Soft. Normal appearance. She exhibits no distension. There is no tenderness. There is no CVA tenderness.  Musculoskeletal: She exhibits tenderness. She exhibits no edema.       Lumbar back: She exhibits tenderness and pain. She exhibits normal range of motion, no swelling, no deformity, no laceration and normal pulse.  Diffuse ttp of the lower lumbar paraspinal muscles.  No spinal tenderness.  Pt has 5/5 strength against resistance of bilateral lower extremities.  Neg SLR bilaterally   Neurological: She is alert and oriented to person, place, and time. She has normal strength. No sensory deficit. She exhibits normal muscle tone. Coordination and gait normal.  Reflex Scores:      Patellar reflexes are 2+ on the right side and 2+ on the left side.      Achilles reflexes are 2+ on the right side and 2+ on the left side. Skin: Skin is warm and dry. Capillary refill takes less than 2 seconds. No rash noted.  Psychiatric: She has a normal mood and affect.  Nursing note and vitals reviewed.    ED Treatments / Results  Labs (all labs ordered are listed, but only abnormal results are displayed) Labs Reviewed - No data to display  EKG  EKG Interpretation None       Radiology No results found.  Procedures Procedures (including critical care time)  Medications Ordered in ED Medications  ketorolac (TORADOL) injection 60 mg (not administered)     Initial Impression / Assessment and Plan / ED  Course  I have reviewed the triage vital signs and the nursing notes.  Pertinent labs & imaging results that were available during my care of the patient were reviewed by me and considered in my medical decision making (see chart for details).     Pt is well appearing.  Ambulates with steady gait.  No focal neuro deficits.  Pain to lower back is diffuse and reproduced with movement and palpation.  Likely musculoskeletal.  Left wrist pain is felt to be secondary to repetitive movement and likely inflammatory.  No concerning sx's for septic joint.  No reported trauma to indicate need for imaging.    velcro wrist splint applied for support and  comfort.  Pt agrees to symptomatic tx with elevate, ice and NSAID.  Ortho referral in one week if not improving    Final Clinical Impressions(s) / ED Diagnoses   Final diagnoses:  Strain of lumbar region, initial encounter  Acute pain of left wrist    ED Discharge Orders    None       Kem Parkinson, Hershal Coria 08/11/17 1355    Mesner, Corene Cornea, MD 08/11/17 1524

## 2017-08-11 NOTE — Discharge Instructions (Signed)
Apply ice packs on/off to your wrist and back.  Wear the wrist brace as needed for at least one week.  Follow-up with your primary doctor or call Dr. Ruthe Mannan office to arrange follow-up in one week if not improving.  Take ibuprofen (with food) 600 mg every 6 hrs as needed for pain.  Return here for any worsening symptoms

## 2017-08-11 NOTE — ED Triage Notes (Signed)
Patient reports of lower back pain/left lower arm pain. Denies any recent injury. Left lower arm is tender when palpated. States pain is increased in back when sitting for long periods of time.

## 2017-08-11 NOTE — ED Notes (Signed)
Splint applied,Patient given discharge instruction, verbalized understand. Patient ambulatory out of the department.

## 2017-08-21 ENCOUNTER — Emergency Department (HOSPITAL_COMMUNITY)
Admission: EM | Admit: 2017-08-21 | Discharge: 2017-08-21 | Disposition: A | Discharge status: Home / Self Care | Payer: BC Managed Care – PPO | Attending: Emergency Medicine | Admitting: Emergency Medicine

## 2017-08-21 ENCOUNTER — Emergency Department (HOSPITAL_COMMUNITY): Payer: BC Managed Care – PPO

## 2017-08-21 ENCOUNTER — Encounter (HOSPITAL_COMMUNITY): Payer: Self-pay | Admitting: Emergency Medicine

## 2017-08-21 ENCOUNTER — Other Ambulatory Visit: Payer: Self-pay

## 2017-08-21 DIAGNOSIS — R1012 Left upper quadrant pain: Secondary | ICD-10-CM | POA: Diagnosis not present

## 2017-08-21 DIAGNOSIS — F17210 Nicotine dependence, cigarettes, uncomplicated: Secondary | ICD-10-CM | POA: Diagnosis not present

## 2017-08-21 DIAGNOSIS — E785 Hyperlipidemia, unspecified: Secondary | ICD-10-CM | POA: Insufficient documentation

## 2017-08-21 LAB — URINALYSIS, ROUTINE W REFLEX MICROSCOPIC
Bilirubin Urine: NEGATIVE
GLUCOSE, UA: NEGATIVE mg/dL
Hgb urine dipstick: NEGATIVE
Ketones, ur: NEGATIVE mg/dL
Leukocytes, UA: NEGATIVE
NITRITE: NEGATIVE
PH: 5 (ref 5.0–8.0)
Protein, ur: 100 mg/dL — AB
Specific Gravity, Urine: 1.013 (ref 1.005–1.030)

## 2017-08-21 LAB — CBC
HCT: 41.2 % (ref 36.0–46.0)
HEMOGLOBIN: 13.9 g/dL (ref 12.0–15.0)
MCH: 29.3 pg (ref 26.0–34.0)
MCHC: 33.7 g/dL (ref 30.0–36.0)
MCV: 86.9 fL (ref 78.0–100.0)
Platelets: 408 10*3/uL — ABNORMAL HIGH (ref 150–400)
RBC: 4.74 MIL/uL (ref 3.87–5.11)
RDW: 13.6 % (ref 11.5–15.5)
WBC: 12 10*3/uL — ABNORMAL HIGH (ref 4.0–10.5)

## 2017-08-21 LAB — COMPREHENSIVE METABOLIC PANEL
ALBUMIN: 3.9 g/dL (ref 3.5–5.0)
ALT: 21 U/L (ref 14–54)
ANION GAP: 11 (ref 5–15)
AST: 20 U/L (ref 15–41)
Alkaline Phosphatase: 88 U/L (ref 38–126)
BILIRUBIN TOTAL: 0.2 mg/dL — AB (ref 0.3–1.2)
BUN: 13 mg/dL (ref 6–20)
CO2: 24 mmol/L (ref 22–32)
Calcium: 9.5 mg/dL (ref 8.9–10.3)
Chloride: 107 mmol/L (ref 101–111)
Creatinine, Ser: 0.64 mg/dL (ref 0.44–1.00)
GFR calc Af Amer: >60 mL/min (ref >60)
GLUCOSE: 98 mg/dL (ref 65–99)
Potassium: 3.6 mmol/L (ref 3.5–5.1)
Sodium: 142 mmol/L (ref 135–145)
TOTAL PROTEIN: 7.5 g/dL (ref 6.5–8.1)

## 2017-08-21 LAB — LIPASE, BLOOD: Lipase: 22 U/L (ref 11–51)

## 2017-08-21 MED ORDER — IOPAMIDOL (ISOVUE-300) INJECTION 61%
100.0000 mL | Freq: Once | INTRAVENOUS | Status: AC | PRN
  Administered 2017-08-21: 100 mL via INTRAVENOUS

## 2017-08-21 MED ORDER — FAMOTIDINE 20 MG PO TABS: 20.0000 mg | ORAL_TABLET | Freq: Two times a day (BID) | ORAL | 0 refills | Status: DC

## 2017-08-21 MED ORDER — SODIUM CHLORIDE 0.9 % IV SOLN: INTRAVENOUS | Status: DC

## 2017-08-21 NOTE — ED Notes (Signed)
Patient to CT.

## 2017-08-21 NOTE — ED Triage Notes (Signed)
Patient c/o left upper abd pain/flank pain. Denies any nausea, vomiting, diarrhea, fever, or urinary symptoms. Per patient she has had cough, tender to touch. Patient states pain increases after eating.

## 2017-08-21 NOTE — ED Provider Notes (Signed)
[facility] EMERGENCY DEPARTMENT Provider Note   CSN: [id number] Arrival date & time: 08/21/17  1158     History   Chief Complaint Chief Complaint  Patient presents with  . Abdominal Pain    HPI Jacqueline Haney is a 62 y.o. female.  Patient with complaint of left upper abdominal pain, left flank pain not associated with nausea vomiting or diarrhea or fever or urinary tract symptoms.  Patient states the area is been tender to touch worse with eating.  Symptoms have been ongoing for several days.      Past Medical History:  Diagnosis Date  . Chest pain    ACS rules out stress test negative  . Hyperlipidemia    refuses meds/diet controlled  . Migraine     Patient Active Problem List   Diagnosis Date Noted  . Family history of coronary artery disease 09/18/2014  . Chest pain at rest 09/17/2014  . Obesity 09/17/2014  . Tobacco abuse 09/17/2014  . Chest pain 09/17/2014  . DERANGEMENT MENISCUS 09/16/2009  . KNEE PAIN 09/16/2009  . SHOULDER PAIN 01/10/2008  . CERVICAL RADICULITIS 01/10/2008  . CERVICAL SPASM 01/10/2008    Past Surgical History:  Procedure Laterality Date  . ABDOMINAL HYSTERECTOMY    . SPLENECTOMY      OB History    Gravida Para Term Preterm AB Living   2 2 2          SAB TAB Ectopic Multiple Live Births                   Home Medications    Prior to Admission medications   Medication Sig Start Date End Date Taking? Authorizing Provider  SUMAtriptan (IMITREX) 50 MG tablet Take 50 mg by mouth every 2 (two) hours as needed for migraine or headache.  09/16/14  Yes [provider]  famotidine (PEPCID) 20 MG tablet Take 1 tablet (20 mg total) by mouth 2 (two) times daily. 08/21/17   Fredia Sorrow, MD    Family History Family History  Problem Relation Age of Onset  . Cancer Mother   . Cancer Father   . Kidney failure Sister   . Heart attack Sister   . Cancer Brother   . Cancer Other     Social History Social History    Tobacco Use  . Smoking status: Current Every Day Smoker    Packs/day: 0.50    Types: Cigarettes  . Smokeless tobacco: Never Used  Substance Use Topics  . Alcohol use: No  . Drug use: No     Allergies   Aspirin; Codeine; Dilaudid [hydromorphone hcl]; Hydrocodone; Oxycodone-acetaminophen; and Tape   Review of Systems Review of Systems  Constitutional: Negative for fever.  HENT: Positive for congestion.   Eyes: Negative for redness.  Respiratory: Positive for cough.   Cardiovascular: Negative for chest pain.  Gastrointestinal: Positive for abdominal pain. Negative for diarrhea, nausea and vomiting.  Genitourinary: Positive for flank pain. Negative for dysuria.  Musculoskeletal: Negative for back pain.  Skin: Negative for rash.  Neurological: Negative for syncope and headaches.  Hematological: Does not bruise/bleed easily.  Psychiatric/Behavioral: Negative for confusion.     Physical Exam Updated Vital Signs BP 109/90 (BP Location: Right Arm)   Pulse 61   Temp 97.9 F (36.6 C) (Oral)   Resp 16   Ht 1.676 m (5\' 6" )   Wt 79.4 kg (175 lb)   SpO2 96%   BMI 28.25 kg/m   Physical Exam  Constitutional: She  is oriented to person, place, and time. She appears well-developed and well-nourished.  HENT:  Head: Normocephalic and atraumatic.  Mouth/Throat: Oropharynx is clear and moist.  Eyes: Conjunctivae and EOM are normal. Pupils are equal, round, and reactive to light.  Neck: Normal range of motion. Neck supple.  Cardiovascular: Normal rate, regular rhythm and intact distal pulses.  Pulmonary/Chest: Effort normal and breath sounds normal.  Abdominal: Soft. Bowel sounds are normal. There is no tenderness.  Musculoskeletal: Normal range of motion.  Neurological: She is alert and oriented to person, place, and time. No cranial nerve deficit or sensory deficit. She exhibits normal muscle tone. Coordination normal.  Skin: Skin is warm.  Nursing note and vitals  reviewed.    ED Treatments / Results  Labs (all labs ordered are listed, but only abnormal results are displayed) Labs Reviewed  COMPREHENSIVE METABOLIC PANEL - Abnormal; Notable for the following components:      Result Value   Total Bilirubin 0.2 (*)    All other components within normal limits  CBC - Abnormal; Notable for the following components:   WBC 12.0 (*)    Platelets 408 (*)    All other components within normal limits  URINALYSIS, ROUTINE W REFLEX MICROSCOPIC - Abnormal; Notable for the following components:   APPearance HAZY (*)    Protein, ur 100 (*)    Bacteria, UA RARE (*)    Squamous Epithelial / LPF 0-5 (*)    All other components within normal limits  LIPASE, BLOOD    EKG  EKG Interpretation None       Radiology Ct Abdomen Pelvis W Contrast  Result Date: 08/21/2017 CLINICAL DATA:  Left upper abdominal and flank pain. EXAM: CT ABDOMEN AND PELVIS WITH CONTRAST TECHNIQUE: Multidetector CT imaging of the abdomen and pelvis was performed using the standard protocol following bolus administration of intravenous contrast. CONTRAST:  143mL ISOVUE-300 IOPAMIDOL (ISOVUE-300) INJECTION 61% COMPARISON:  09/30/2016, 05/16/2012 FINDINGS: Lower chest: Top-normal size heart without pericardial effusion. No acute pulmonary abnormality at the lung bases. Hepatobiliary: No focal liver abnormality is seen. Mild hepatic steatosis. No gallstones, gallbladder wall thickening, or biliary dilatation. Pancreas: Unremarkable. No pancreatic ductal dilatation or surrounding inflammatory changes. Spleen: Splenectomy Adrenals/Urinary Tract: Stable bilateral adrenal nodules. 12 mm interpolar left renal cyst. No obstructive uropathy. Punctate nonobstructing calculus in the lower pole the right kidney. No hydronephrosis. Urinary bladder is unremarkable. Stomach/Bowel: The stomach is contracted in appearance. Normal small bowel rotation is noted without bowel obstruction or inflammation. Normal  distal and terminal ileum. Normal appendix. Moderate fecal retention within the colon. Vascular/Lymphatic: Aortic atherosclerosis. No enlarged abdominal or pelvic lymph nodes. Reproductive: Status post hysterectomy. No adnexal masses. Other: No abdominal wall hernia or abnormality. No abdominopelvic ascites. Musculoskeletal: No acute or significant osseous findings. IMPRESSION: 1. Punctate nonobstructing right lower pole renal calculus. 2. No acute bowel obstruction or inflammation. 3. Stable bilateral adrenal nodules dating back to 2013. 4. 12 mm interpolar left renal cyst, increased from 7 mm from 2013 the without worrisome features likely a small simple cyst. 5. Status post splenectomy. 6. Mild hepatic steatosis. Electronically Signed   By: Ashley Royalty M.D.   On: 08/21/2017 18:18    Procedures Procedures (including critical care time)  Medications Ordered in ED Medications  0.9 %  sodium chloride infusion (not administered)  iopamidol (ISOVUE-300) 61 % injection 100 mL (100 mLs Intravenous Contrast Given 08/21/17 1727)     Initial Impression / Assessment and Plan / ED Course  I have  reviewed the triage vital signs and the nursing notes.  Pertinent labs & imaging results that were available during my care of the patient were reviewed by me and considered in my medical decision making (see chart for details).    Patient's symptoms concerning for diverticulitis.  However CT scan now without any acute findings.  Symptoms could possibly be related to peptic ulcer disease.  So patient will be treated with a trial of Pepcid and follow back up with [place] without significant findings other than a mild leukocytosis with white blood cell count of 12,000.  Patient nontoxic no acute distress.   Final Clinical Impressions(s) / ED Diagnoses   Final diagnoses:  Left upper quadrant pain    ED Discharge Orders        Ordered    famotidine (PEPCID) 20 MG tablet  2 times daily      08/21/17 1835       Fredia Sorrow, MD 08/22/17 0110

## 2017-08-21 NOTE — Discharge Instructions (Signed)
Workup without any acute findings.  Symptoms could be consistent with a stomach ulcer trial of the Pepcid for the next 2 weeks.  If you improve highly suggestive of a stomach ulcer.  If you do not improve it has not been ruled out.  Your doctor can do additional studies to evaluate it further.  Make an appointment to follow-up with your regular doctor.  Return for any new or worse symptoms.

## 2017-09-09 ENCOUNTER — Other Ambulatory Visit (HOSPITAL_COMMUNITY): Payer: Self-pay | Admitting: Family Medicine

## 2017-09-09 ENCOUNTER — Ambulatory Visit (HOSPITAL_COMMUNITY)
Admission: RE | Admit: 2017-09-09 | Discharge: 2017-09-09 | Disposition: A | Discharge status: Home / Self Care | Payer: BC Managed Care – PPO | Source: Ambulatory Visit | Attending: Family Medicine | Admitting: Family Medicine

## 2017-09-09 DIAGNOSIS — M25532 Pain in left wrist: Secondary | ICD-10-CM

## 2017-09-22 ENCOUNTER — Ambulatory Visit: Payer: BC Managed Care – PPO | Admitting: Orthopaedic Surgery

## 2017-09-22 ENCOUNTER — Encounter: Payer: Self-pay | Admitting: Orthopaedic Surgery

## 2017-09-22 VITALS — BP 128/75 | HR 97 | Temp 98.5°F | Ht 65.0 in | Wt 180.0 lb

## 2017-09-22 DIAGNOSIS — M654 Radial styloid tenosynovitis [de Quervain]: Secondary | ICD-10-CM | POA: Diagnosis not present

## 2017-09-22 NOTE — Progress Notes (Signed)
Subjective:    Patient ID: Jacqueline Haney, female    DOB: Aug 12, 1956, 62 y.o.   MRN: [id number]  HPI She has had pain in the left first compartment for several weeks.  She had been making chittenlings the first few weeks of January.  She started having pain and it has gotten worse.  She has no trauma, no redness.  It hurts to use the left thumb and left wrist.  She has no numbness. It is not getting better.  She has used ice, heat and Advil.  She was seen at the [facility].  I have reviewed their notes.  X-rays were negative.  I have reviewed them also.   Review of Systems  Musculoskeletal: Positive for arthralgias.  All other systems reviewed and are negative.  Past Medical History:  Diagnosis Date  . Chest pain    ACS rules out stress test negative  . Hyperlipidemia    refuses meds/diet controlled  . Migraine     Past Surgical History:  Procedure Laterality Date  . ABDOMINAL HYSTERECTOMY    . SPLENECTOMY      Current Outpatient Medications on File Prior to Visit  Medication Sig Dispense Refill  . naproxen (NAPROSYN) 500 MG tablet TK 1 T PO BID FOR PAIN  0  . famotidine (PEPCID) 20 MG tablet Take 1 tablet (20 mg total) by mouth 2 (two) times daily. (Patient not taking: Reported on 09/22/2017) 30 tablet 0  . SUMAtriptan (IMITREX) 50 MG tablet Take 50 mg by mouth every 2 (two) hours as needed for migraine or headache.      No current facility-administered medications on file prior to visit.     Social History   Socioeconomic History  . Marital status: Divorced    Spouse name: Not on file  . Number of children: Not on file  . Years of education: Not on file  . Highest education level: Not on file  Social Needs  . Financial resource strain: Not on file  . Food insecurity - worry: Not on file  . Food insecurity - inability: Not on file  . Transportation needs - medical: Not on file  . Transportation needs - non-medical: Not on file  Occupational History  . Not  on file  Tobacco Use  . Smoking status: Current Every Day Smoker    Packs/day: 0.50    Types: Cigarettes  . Smokeless tobacco: Never Used  Substance and Sexual Activity  . Alcohol use: No  . Drug use: No  . Sexual activity: Yes    Birth control/protection: Surgical  Other Topics Concern  . Not on file  Social History Narrative  . Not on file    Family History  Problem Relation Age of Onset  . Cancer Mother   . Cancer Father   . Kidney failure Sister   . Heart attack Sister   . Cancer Brother   . Cancer Other     BP 128/75   Pulse 97   Temp 98.5 F (36.9 C)   Ht 5\' 5"  (1.651 m)   Wt 180 lb (81.6 kg)   BMI 29.95 kg/m      Objective:   Physical Exam  Constitutional: She is oriented to person, place, and time. She appears well-developed and well-nourished.  HENT:  Head: Normocephalic and atraumatic.  Eyes: Conjunctivae and EOM are normal. Pupils are equal, round, and reactive to light.  Neck: Normal range of motion. Neck supple.  Cardiovascular: Normal rate, regular rhythm and  intact distal pulses.  Pulmonary/Chest: Effort normal.  Abdominal: Soft.  Musculoskeletal: She exhibits tenderness (Pain of the first extensor compartment left wrist, positive Finklestein sign, decreased ROM, no redness, no swelling, NV intact.  Right wrist negative.).  Neurological: She is alert and oriented to person, place, and time. She displays normal reflexes. No cranial nerve deficit. She exhibits normal muscle tone. Coordination normal.  Skin: Skin is warm and dry.  Psychiatric: She has a normal mood and affect. Her behavior is normal. Judgment and thought content normal.  Vitals reviewed.   A thumb splint has been provided.      Assessment & Plan:   Encounter Diagnosis  Name Primary?  Tennis Must Quervain's disease (radial styloid tenosynovitis) Yes   Procedure note: After permission from the patient the first extensor compartment of the wrist on the left was prepped.  1% Xylocaine  and 1 cc DepoMedrol was injected into the area by sterile technique and tolerated well.  Return in two weeks.  Samples of Aleve given, one po bid pc  Call if any problem.  Precautions discussed.   Electronically Signed Sanjuana Kava, MD 2/7/20191:50 PM

## 2017-09-22 NOTE — Patient Instructions (Signed)
Steps to Quit Smoking Smoking tobacco can be bad for your health. It can also affect almost every organ in your body. Smoking puts you and people around you at risk for many serious long-lasting (chronic) diseases. Quitting smoking is hard, but it is one of the best things that you can do for your health. It is never too late to quit. What are the benefits of quitting smoking? When you quit smoking, you lower your risk for getting serious diseases and conditions. They can include:  Lung cancer or lung disease.  Heart disease.  Stroke.  Heart attack.  Not being able to have children (infertility).  Weak bones (osteoporosis) and broken bones (fractures).  If you have coughing, wheezing, and shortness of breath, those symptoms may get better when you quit. You may also get sick less often. If you are pregnant, quitting smoking can help to lower your chances of having a baby of low birth weight. What can I do to help me quit smoking? Talk with your doctor about what can help you quit smoking. Some things you can do (strategies) include:  Quitting smoking totally, instead of slowly cutting back how much you smoke over a period of time.  Going to in-person counseling. You are more likely to quit if you go to many counseling sessions.  Using resources and support systems, such as: ? Online chats with a counselor. ? Phone quitlines. ? Printed self-help materials. ? Support groups or group counseling. ? Text messaging programs. ? Mobile phone apps or applications.  Taking medicines. Some of these medicines may have nicotine in them. If you are pregnant or breastfeeding, do not take any medicines to quit smoking unless your doctor says it is okay. Talk with your doctor about counseling or other things that can help you.  Talk with your doctor about using more than one strategy at the same time, such as taking medicines while you are also going to in-person counseling. This can help make  quitting easier. What things can I do to make it easier to quit? Quitting smoking might feel very hard at first, but there is a lot that you can do to make it easier. Take these steps:  Talk to your family and friends. Ask them to support and encourage you.  Call phone quitlines, reach out to support groups, or work with a counselor.  Ask people who smoke to not smoke around you.  Avoid places that make you want (trigger) to smoke, such as: ? Bars. ? Parties. ? Smoke-break areas at work.  Spend time with people who do not smoke.  Lower the stress in your life. Stress can make you want to smoke. Try these things to help your stress: ? Getting regular exercise. ? Deep-breathing exercises. ? Yoga. ? Meditating. ? Doing a body scan. To do this, close your eyes, focus on one area of your body at a time from head to toe, and notice which parts of your body are tense. Try to relax the muscles in those areas.  Download or buy apps on your mobile phone or tablet that can help you stick to your quit plan. There are many free apps, such as QuitGuide from the CDC (Centers for Disease Control and Prevention). You can find more support from smokefree.gov and other websites.  This information is not intended to replace advice given to you by your health care provider. Make sure you discuss any questions you have with your health care provider. Document Released: 05/29/2009 Document   Revised: 03/30/2016 Document Reviewed: 12/17/2014 Elsevier Interactive Patient Education  2018 Elsevier Inc.  

## 2017-10-06 ENCOUNTER — Encounter: Payer: Self-pay | Admitting: Orthopaedic Surgery

## 2017-10-06 ENCOUNTER — Ambulatory Visit: Payer: BC Managed Care – PPO | Admitting: Orthopaedic Surgery

## 2017-10-06 VITALS — BP 115/79 | HR 83 | Temp 98.1°F | Ht 65.0 in | Wt 180.0 lb

## 2017-10-06 DIAGNOSIS — M654 Radial styloid tenosynovitis [de Quervain]: Secondary | ICD-10-CM

## 2017-10-06 NOTE — Progress Notes (Signed)
CC:  I am doing great  She has no pain at all of the left wrist. She has no thumb pain.  NV intact. ROM full.  No redness or swelling, no pain.  Encounter Diagnosis  Name Primary?  Tennis Must Quervain's disease (radial styloid tenosynovitis) Yes   Discharge.  Call if any problem.  Precautions discussed.   Electronically Signed Sanjuana Kava, MD 2/21/20192:21 PM

## 2017-10-28 ENCOUNTER — Other Ambulatory Visit (HOSPITAL_COMMUNITY): Payer: Self-pay | Admitting: Family Medicine

## 2017-10-28 ENCOUNTER — Ambulatory Visit (HOSPITAL_COMMUNITY)
Admission: RE | Admit: 2017-10-28 | Discharge: 2017-10-28 | Disposition: A | Discharge status: Home / Self Care | Payer: BC Managed Care – PPO | Source: Ambulatory Visit | Attending: Family Medicine | Admitting: Family Medicine

## 2017-10-28 DIAGNOSIS — M545 Low back pain: Secondary | ICD-10-CM | POA: Diagnosis not present

## 2017-10-28 DIAGNOSIS — M5136 Other intervertebral disc degeneration, lumbar region: Secondary | ICD-10-CM | POA: Diagnosis not present

## 2018-02-08 ENCOUNTER — Inpatient Hospital Stay (HOSPITAL_COMMUNITY)
Admission: EM | Admit: 2018-02-08 | Discharge: 2018-02-12 | DRG: 392 | Disposition: A | Discharge status: Home / Self Care | Payer: BC Managed Care – PPO | Attending: Family Medicine | Admitting: Family Medicine

## 2018-02-08 ENCOUNTER — Encounter (HOSPITAL_COMMUNITY): Payer: Self-pay

## 2018-02-08 ENCOUNTER — Emergency Department (HOSPITAL_COMMUNITY): Payer: BC Managed Care – PPO

## 2018-02-08 ENCOUNTER — Other Ambulatory Visit: Payer: Self-pay

## 2018-02-08 DIAGNOSIS — Z885 Allergy status to narcotic agent status: Secondary | ICD-10-CM | POA: Diagnosis not present

## 2018-02-08 DIAGNOSIS — F17210 Nicotine dependence, cigarettes, uncomplicated: Secondary | ICD-10-CM | POA: Diagnosis not present

## 2018-02-08 DIAGNOSIS — K2980 Duodenitis without bleeding: Secondary | ICD-10-CM | POA: Diagnosis present

## 2018-02-08 DIAGNOSIS — I774 Celiac artery compression syndrome: Secondary | ICD-10-CM | POA: Diagnosis present

## 2018-02-08 DIAGNOSIS — K922 Gastrointestinal hemorrhage, unspecified: Secondary | ICD-10-CM

## 2018-02-08 DIAGNOSIS — G43909 Migraine, unspecified, not intractable, without status migrainosus: Secondary | ICD-10-CM | POA: Diagnosis present

## 2018-02-08 DIAGNOSIS — E785 Hyperlipidemia, unspecified: Secondary | ICD-10-CM | POA: Diagnosis not present

## 2018-02-08 DIAGNOSIS — F1990 Other psychoactive substance use, unspecified, uncomplicated: Secondary | ICD-10-CM | POA: Diagnosis present

## 2018-02-08 DIAGNOSIS — R58 Hemorrhage, not elsewhere classified: Secondary | ICD-10-CM | POA: Diagnosis present

## 2018-02-08 DIAGNOSIS — Z886 Allergy status to analgesic agent status: Secondary | ICD-10-CM

## 2018-02-08 DIAGNOSIS — K659 Peritonitis, unspecified: Secondary | ICD-10-CM

## 2018-02-08 DIAGNOSIS — R1013 Epigastric pain: Secondary | ICD-10-CM | POA: Diagnosis not present

## 2018-02-08 DIAGNOSIS — Z91048 Other nonmedicinal substance allergy status: Secondary | ICD-10-CM | POA: Diagnosis not present

## 2018-02-08 DIAGNOSIS — G8929 Other chronic pain: Secondary | ICD-10-CM | POA: Diagnosis not present

## 2018-02-08 DIAGNOSIS — K279 Peptic ulcer, site unspecified, unspecified as acute or chronic, without hemorrhage or perforation: Secondary | ICD-10-CM

## 2018-02-08 DIAGNOSIS — K3189 Other diseases of stomach and duodenum: Secondary | ICD-10-CM | POA: Diagnosis not present

## 2018-02-08 DIAGNOSIS — R109 Unspecified abdominal pain: Secondary | ICD-10-CM | POA: Diagnosis present

## 2018-02-08 DIAGNOSIS — Z9081 Acquired absence of spleen: Secondary | ICD-10-CM

## 2018-02-08 DIAGNOSIS — Z90710 Acquired absence of both cervix and uterus: Secondary | ICD-10-CM | POA: Diagnosis not present

## 2018-02-08 DIAGNOSIS — R198 Other specified symptoms and signs involving the digestive system and abdomen: Secondary | ICD-10-CM | POA: Diagnosis not present

## 2018-02-08 DIAGNOSIS — R933 Abnormal findings on diagnostic imaging of other parts of digestive tract: Secondary | ICD-10-CM | POA: Diagnosis not present

## 2018-02-08 LAB — CBC WITH DIFFERENTIAL/PLATELET
Basophils Absolute: 0 10*3/uL (ref 0.0–0.1)
Basophils Relative: 0 %
Eosinophils Absolute: 0 10*3/uL (ref 0.0–0.7)
Eosinophils Relative: 0 %
HCT: 39.4 % (ref 36.0–46.0)
Hemoglobin: 13.1 g/dL (ref 12.0–15.0)
Lymphocytes Relative: 16 %
Lymphs Abs: 3.2 10*3/uL (ref 0.7–4.0)
MCH: 29.2 pg (ref 26.0–34.0)
MCHC: 33.2 g/dL (ref 30.0–36.0)
MCV: 87.8 fL (ref 78.0–100.0)
Monocytes Absolute: 0.9 10*3/uL (ref 0.1–1.0)
Monocytes Relative: 4 %
Neutro Abs: 15.5 10*3/uL — ABNORMAL HIGH (ref 1.7–7.7)
Neutrophils Relative %: 80 %
Platelets: 297 10*3/uL (ref 150–400)
RBC: 4.49 MIL/uL (ref 3.87–5.11)
RDW: 13.8 % (ref 11.5–15.5)
WBC: 19.6 10*3/uL — ABNORMAL HIGH (ref 4.0–10.5)

## 2018-02-08 LAB — URINALYSIS, ROUTINE W REFLEX MICROSCOPIC
Bacteria, UA: NONE SEEN
Bilirubin Urine: NEGATIVE
Glucose, UA: NEGATIVE mg/dL
Ketones, ur: NEGATIVE mg/dL
Leukocytes, UA: NEGATIVE
Nitrite: NEGATIVE
Protein, ur: 30 mg/dL — AB
Specific Gravity, Urine: 1.031 — ABNORMAL HIGH (ref 1.005–1.030)
pH: 5 (ref 5.0–8.0)

## 2018-02-08 LAB — COMPREHENSIVE METABOLIC PANEL
ALT: 24 U/L (ref 0–44)
AST: 23 U/L (ref 15–41)
Albumin: 3.8 g/dL (ref 3.5–5.0)
Alkaline Phosphatase: 70 U/L (ref 38–126)
Anion gap: 10 (ref 5–15)
BUN: 16 mg/dL (ref 8–23)
CO2: 20 mmol/L — ABNORMAL LOW (ref 22–32)
Calcium: 9.2 mg/dL (ref 8.9–10.3)
Chloride: 110 mmol/L (ref 98–111)
Creatinine, Ser: 0.6 mg/dL (ref 0.44–1.00)
GFR calc Af Amer: >60 mL/min (ref >60)
GFR calc non Af Amer: >60 mL/min (ref >60)
Glucose, Bld: 131 mg/dL — ABNORMAL HIGH (ref 70–99)
Potassium: 3.5 mmol/L (ref 3.5–5.1)
Sodium: 140 mmol/L (ref 135–145)
Total Bilirubin: 0.5 mg/dL (ref 0.3–1.2)
Total Protein: 7.1 g/dL (ref 6.5–8.1)

## 2018-02-08 LAB — CBC
HCT: 41.3 % (ref 36.0–46.0)
Hemoglobin: 13.9 g/dL (ref 12.0–15.0)
MCH: 29.5 pg (ref 26.0–34.0)
MCHC: 33.7 g/dL (ref 30.0–36.0)
MCV: 87.7 fL (ref 78.0–100.0)
Platelets: 199 10*3/uL (ref 150–400)
RBC: 4.71 MIL/uL (ref 3.87–5.11)
RDW: 13.7 % (ref 11.5–15.5)
WBC: 15.4 10*3/uL — ABNORMAL HIGH (ref 4.0–10.5)

## 2018-02-08 LAB — TYPE AND SCREEN
ABO/RH(D): O POS
Antibody Screen: NEGATIVE

## 2018-02-08 LAB — MAGNESIUM: Magnesium: 1.7 mg/dL (ref 1.7–2.4)

## 2018-02-08 LAB — LIPASE, BLOOD: Lipase: 22 U/L (ref 11–51)

## 2018-02-08 LAB — PHOSPHORUS: Phosphorus: 4.5 mg/dL (ref 2.5–4.6)

## 2018-02-08 LAB — CBG MONITORING, ED: GLUCOSE-CAPILLARY: 142 mg/dL — AB (ref 70–99)

## 2018-02-08 LAB — LACTIC ACID, PLASMA: Lactic Acid, Venous: 1.1 mmol/L (ref 0.5–1.9)

## 2018-02-08 LAB — PROTIME-INR
INR: 1
Prothrombin Time: 13.1 seconds (ref 11.4–15.2)

## 2018-02-08 LAB — TROPONIN I: Troponin I: <0.03 ng/mL (ref <0.03)

## 2018-02-08 MED ORDER — SODIUM BICARBONATE 8.4 % IV SOLN
INTRAVENOUS | Status: AC
  Filled 2018-02-08: qty 150

## 2018-02-08 MED ORDER — POTASSIUM CHLORIDE 10 MEQ/100ML IV SOLN
10.0000 meq | INTRAVENOUS | Status: DC
  Administered 2018-02-08: 10 meq via INTRAVENOUS
  Filled 2018-02-08: qty 100

## 2018-02-08 MED ORDER — SODIUM CHLORIDE 0.9 % IV BOLUS
1000.0000 mL | Freq: Once | INTRAVENOUS | Status: AC
  Administered 2018-02-08: 1000 mL via INTRAVENOUS

## 2018-02-08 MED ORDER — MORPHINE SULFATE (PF) 4 MG/ML IV SOLN
4.0000 mg | Freq: Once | INTRAVENOUS | Status: AC
  Administered 2018-02-08: 4 mg via INTRAVENOUS
  Filled 2018-02-08: qty 1

## 2018-02-08 MED ORDER — PIPERACILLIN-TAZOBACTAM 3.375 G IVPB 30 MIN
3.3750 g | Freq: Once | INTRAVENOUS | Status: AC
  Administered 2018-02-08: 3.375 g via INTRAVENOUS
  Filled 2018-02-08: qty 50

## 2018-02-08 MED ORDER — IOPAMIDOL (ISOVUE-370) INJECTION 76%
100.0000 mL | Freq: Once | INTRAVENOUS | Status: AC | PRN
  Administered 2018-02-08: 100 mL via INTRAVENOUS

## 2018-02-08 MED ORDER — HYDROMORPHONE HCL 1 MG/ML IJ SOLN
0.5000 mg | Freq: Once | INTRAMUSCULAR | Status: AC
  Administered 2018-02-08: 0.5 mg via INTRAVENOUS
  Filled 2018-02-08: qty 1

## 2018-02-08 MED ORDER — PANTOPRAZOLE SODIUM 40 MG IV SOLR
80.0000 mg | Freq: Once | INTRAVENOUS | Status: AC
  Administered 2018-02-08: 80 mg via INTRAVENOUS
  Filled 2018-02-08: qty 80

## 2018-02-08 MED ORDER — ONDANSETRON HCL 4 MG/2ML IJ SOLN
4.0000 mg | Freq: Once | INTRAMUSCULAR | Status: AC
  Administered 2018-02-08: 4 mg via INTRAVENOUS
  Filled 2018-02-08: qty 2

## 2018-02-08 MED ORDER — SODIUM BICARBONATE 8.4 % IV SOLN
INTRAVENOUS | Status: DC
  Administered 2018-02-08: 21:00:00 via INTRAVENOUS
  Filled 2018-02-08 (×5): qty 150

## 2018-02-08 MED ORDER — SODIUM CHLORIDE 0.9 % IV SOLN
8.0000 mg/h | INTRAVENOUS | Status: DC
  Administered 2018-02-08 – 2018-02-09 (×3): 8 mg/h via INTRAVENOUS
  Filled 2018-02-08 (×7): qty 80

## 2018-02-08 NOTE — ED Notes (Signed)
Per Network engineer, pt is not going upstairs, she is going to [facility]

## 2018-02-08 NOTE — ED Provider Notes (Signed)
[facility] EMERGENCY DEPARTMENT Provider Note   CSN: [id number] Arrival date & time: 02/08/18  1350     History   Chief Complaint Chief Complaint  Patient presents with  . Abdominal Pain    HPI Jacqueline Haney is a 62 y.o. female.  HPI  61yF with abdominal pain. Acute onset shortly before arrival. Periumbilical/epigastric with radiation into back. Constant since. Associated nausea. Felt fine earlier in the day. No fever or chills. No urinary complaints. Abdominal surgical history significant for hysterectomy and splenectomy. Never had pain like this before. Denies significant etoh. Take up to 6 ibuprofen daily for headaches and has done this for years.   Past Medical History:  Diagnosis Date  . Chest pain    ACS rules out stress test negative  . Hyperlipidemia    refuses meds/diet controlled  . Migraine     Patient Active Problem List   Diagnosis Date Noted  . Family history of coronary artery disease 09/18/2014  . Chest pain at rest 09/17/2014  . Obesity 09/17/2014  . Tobacco abuse 09/17/2014  . Chest pain 09/17/2014  . DERANGEMENT MENISCUS 09/16/2009  . KNEE PAIN 09/16/2009  . SHOULDER PAIN 01/10/2008  . CERVICAL RADICULITIS 01/10/2008  . CERVICAL SPASM 01/10/2008    Past Surgical History:  Procedure Laterality Date  . ABDOMINAL HYSTERECTOMY    . SPLENECTOMY       OB History    Gravida  2   Para  2   Term  2   Preterm      AB      Living        SAB      TAB      Ectopic      Multiple      Live Births               Home Medications    Prior to Admission medications   Medication Sig Start Date End Date Taking? Authorizing Provider  naproxen (NAPROSYN) 500 MG tablet TK 1 T PO BID FOR PAIN 09/09/17   [provider]  SUMAtriptan (IMITREX) 50 MG tablet Take 50 mg by mouth every 2 (two) hours as needed for migraine or headache.  09/16/14   [provider]    Family History Family History  Problem Relation Age of  Onset  . Cancer Mother   . Cancer Father   . Kidney failure Sister   . Heart attack Sister   . Cancer Brother   . Cancer Other     Social History Social History   Tobacco Use  . Smoking status: Current Every Day Smoker    Packs/day: 0.50    Types: Cigarettes  . Smokeless tobacco: Never Used  Substance Use Topics  . Alcohol use: No  . Drug use: No     Allergies   Aspirin; Codeine; Dilaudid [hydromorphone hcl]; Hydrocodone; Oxycodone-acetaminophen; and Tape   Review of Systems Review of Systems  All systems reviewed and negative, other than as noted in HPI.  Physical Exam Updated Vital Signs BP 101/84 (BP Location: Right Arm)   Pulse 82   Temp 98.1 F (36.7 C) (Oral)   Resp (!) 24   Ht 5\' 6"  (1.676 m)   Wt 83.5 kg (184 lb)   SpO2 100%   BMI 29.70 kg/m   Physical Exam  Constitutional: She appears well-developed and well-nourished.  Laying in bed.  Appears uncomfortable.  HENT:  Head: Normocephalic and atraumatic.  Eyes: Conjunctivae are normal. Right  eye exhibits no discharge. Left eye exhibits no discharge.  Neck: Neck supple.  Cardiovascular: Normal rate, regular rhythm and normal heart sounds. Exam reveals no gallop and no friction rub.  No murmur heard. Pulmonary/Chest: Effort normal and breath sounds normal. No respiratory distress.  Abdominal: She exhibits no pulsatile midline mass and no mass. There is tenderness.  Diffuse abdominal tenderness. Guarding with palpation everywhere. Perhaps some mild distention.  A lot of gas on bedside US. No stones noted in GB. What I think is the aorta as viewed just above iliac bifurcation measures 2.1 cm. No free fluid noted in RUQ or on pelvic views. No significant pericardial effusion. Symmetric femoral pulses.   Musculoskeletal: She exhibits no edema or tenderness.  Neurological: She is alert.  Skin: Skin is warm and dry.  Psychiatric: She has a normal mood and affect. Her behavior is normal. Thought content  normal.  Nursing note and vitals reviewed.    ED Treatments / Results  Labs (all labs ordered are listed, but only abnormal results are displayed) Labs Reviewed  COMPREHENSIVE METABOLIC PANEL - Abnormal; Notable for the following components:      Result Value   CO2 20 (*)    Glucose, Bld 131 (*)    All other components within normal limits  CBC - Abnormal; Notable for the following components:   WBC 15.4 (*)    All other components within normal limits  URINALYSIS, ROUTINE W REFLEX MICROSCOPIC - Abnormal; Notable for the following components:   Specific Gravity, Urine 1.031 (*)    Hgb urine dipstick SMALL (*)    Protein, ur 30 (*)    All other components within normal limits  LIPASE, BLOOD  TROPONIN I  TYPE AND SCREEN    EKG None  Radiology Dg Abdomen Acute W/chest  Result Date: 02/08/2018 CLINICAL DATA:  Acute onset abdominal and back pain. Evaluate for pneumoperitoneum. EXAM: DG ABDOMEN ACUTE W/ 1V CHEST COMPARISON:  CT abdomen pelvis dated August 21, 2017. Chest x-ray dated October 08, 2016. FINDINGS: There is no evidence of dilated bowel loops or free intraperitoneal air. No radiopaque calculi or other significant radiographic abnormality is seen. Heart size and mediastinal contours are within normal limits. Both lungs are clear. No acute osseous abnormality. IMPRESSION: Negative abdominal radiographs.  No acute cardiopulmonary disease. Electronically Signed   By: Titus Dubin M.D.   On: 02/08/2018 15:28   Ct Angio Chest/abd/pel For Dissection W And/or Wo Contrast  Result Date: 02/08/2018 CLINICAL DATA:  Severe abdominal pain and back pain EXAM: CT ANGIOGRAPHY CHEST, ABDOMEN AND PELVIS TECHNIQUE: Multidetector CT imaging through the chest, abdomen and pelvis was performed using the standard protocol during bolus administration of intravenous contrast. Multiplanar reconstructed images and MIPs were obtained and reviewed to evaluate the vascular anatomy. CONTRAST:  177mL  ISOVUE-370 IOPAMIDOL (ISOVUE-370) INJECTION 76% COMPARISON:  08/21/2017.  05/10/2013. FINDINGS: CTA CHEST FINDINGS Cardiovascular: There is no evidence of acute intramural hematoma or aortic dissection. Maximal diameter of the ascending aorta is 3.4 cm. There is no obvious evidence of acute pulmonary thromboembolism. Great vessels are patent. Vertebral arteries are patent within the confines of the study. No significant coronary artery calcifications. Mediastinum/Nodes: No abnormal mediastinal adenopathy. No pericardial effusion. Visualized thyroid is unremarkable. Lungs/Pleura: No pneumothorax. No pleural effusion. Minimal scarring at the lung apices. Minimal dependent atelectasis in the lungs. Musculoskeletal: No vertebral compression deformity. No acute rib fracture. Review of the MIP images confirms the above findings. CTA ABDOMEN AND PELVIS FINDINGS VASCULAR Aorta: No  evidence of aortic dissection or aneurysm. Aorta is patent. Mild atherosclerotic calcifications and minimal soft plaque in the lower abdominal aorta. 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 Branch vessels are patent. Accessory left hepatic artery anatomy. SMA: Patent. Renals: Single renal arteries are patent. IMA: Patent. Inflow: Bilateral common iliac, external iliac, and internal iliac arteries are patent. Review of the MIP images confirms the above findings. NON-VASCULAR Hepatobiliary: Heterogeneous enhancement likely due to arterial phase imaging. No focal mass. Gallbladder is within normal limits. Pancreas: There is stranding and blurring of the fat planes in the head of the pancreas. There is heterogeneous enhancement in the head of the pancreas. These findings support acute pancreatitis. There is no evidence of pancreatic necrosis. There is stranding which extends from the head of the pancreas inferiorly, surrounding the duodenum, and into the retroperitoneum. Hounsfield unit measurements are elevated. These  findings are worrisome for retroperitoneal hemorrhage. Soft tissue mass would be a secondary consideration. Spleen: Post splenectomy. Adrenals/Urinary Tract: Tiny calculus in the lower pole of the right kidney. Left kidney is unremarkable. Stable left adrenal nodule. Unremarkable bladder. Stomach/Bowel: There is severe wall thickening and edema of the duodenum, associated with inflammation of the pancreatic head. As described above, high density stranding around the duodenum is noted worrisome for retroperitoneal hemorrhage. No evidence of small-bowel obstruction. Normal appendix. No obvious mass in the colon. Lymphatic: No abnormal retroperitoneal adenopathy. Reproductive: Uterus is absent.  Adnexa are within normal limits. Other: No free fluid. Musculoskeletal: No vertebral compression deformity. Review of the MIP images confirms the above findings. IMPRESSION: Vascular: No evidence of aortic dissection. Nonvascular: There is retroperitoneal hemorrhage associated with the head of the pancreas and duodenum as described above. These may represent a complication of acute pancreatitis. There is no evidence of pancreatic necrosis. Alternatively, the soft tissue density may represent neoplasm rather than hemorrhage. Electronically Signed   By: Marybelle Killings M.D.   On: 02/08/2018 16:06    Procedures Procedures (including critical care time)  CRITICAL CARE Performed by: Virgel Manifold Total critical care time: 75 minutes Critical care time was exclusive of separately billable procedures and treating other patients. Critical care was necessary to treat or prevent imminent or life-threatening deterioration. Critical care was time spent personally by me on the following activities: development of treatment plan with patient and/or surrogate as well as nursing, discussions with consultants, evaluation of patient's response to treatment, examination of patient, obtaining history from patient or surrogate, ordering and  performing treatments and interventions, ordering and review of laboratory studies, ordering and review of radiographic studies, pulse oximetry and re-evaluation of patient's condition.   Medications Ordered in ED Medications  pantoprazole (PROTONIX) 80 mg in sodium chloride 0.9 % 250 mL (0.32 mg/mL) infusion (has no administration in time range)  HYDROmorphone (DILAUDID) injection 0.5 mg (has no administration in time range)  sodium chloride 0.9 % bolus 1,000 mL (0 mLs Intravenous Stopped 02/08/18 1532)  morphine 4 MG/ML injection 4 mg (4 mg Intravenous Given 02/08/18 1442)  ondansetron (ZOFRAN) injection 4 mg (4 mg Intravenous Given 02/08/18 1442)  iopamidol (ISOVUE-370) 76 % injection 100 mL (100 mLs Intravenous Contrast Given 02/08/18 1520)  piperacillin-tazobactam (ZOSYN) IVPB 3.375 g (0 g Intravenous Stopped 02/08/18 1655)  pantoprazole (PROTONIX) injection 80 mg (80 mg Intravenous Given 02/08/18 1649)     Initial Impression / Assessment and Plan / ED Course  I have reviewed the triage vital signs and the nursing notes.  Pertinent labs & imaging results that were available  during my care of the patient were reviewed by me and considered in my medical decision making (see chart for details).  Clinical Course as of Feb 09 1655  Wed Feb 08, 3641  1289 62 year old female with an acute onset of mid/upper abdominal pain and back pain.  I suspect she may have a perforated ulcer.  She does have peritonitis on exam.  Consider ruptured AAA but I feel that this is less likely.  Hard for me to me to get great images on [place].  What I can visualize of the aorta does not appear to be aneurysmal though.  No free fluid noted.  No significant pericardial effusion.  We will initially obtain plain films to evaluate for free air.  She will get a CT chest, abdomen and pelvis.  Will make this a angiographic study to evaluate for dissection although, again, I feel this is less likely.  Blood pressure is currently okay.   Heart rate in the 80s to 90s.  N.p.o.  Labs.  Fluids and pain control.   [SK]  1438 CT a/p from 08/2017 reviewed and had normal caliber AA.    [SK]  1619 Imaging as above. Difficult to ascertain exact etiology. She really has no risk factors for pancreatitis. Lipase normal. Very acute onset of symptoms. My clinical sense is still that this is related to PUD.  Long standing daily NSAID use. Severe wall thickening and edema of duodenum. No free air noted though which you would expect with perforation. Discussed with Dr Arnoldo Morale, general surgery. Without free air he would not operate at this point. Recommends antibiotics, bowel rest (ok with ice chips), PPI, admission.    [SK]    Clinical Course User Index [SK] Virgel Manifold, MD   62 year old female with acute onset of abdominal and back pain.  Suspect she may have a perforated gastric ulcer.  She reports that she takes 6 ibuprofen on a daily basis and has done this for years. She does have peritonitis on exam.   I did not visualize an AAA or free fluid in the abdomen.  There is no pericardial effusion. EKG with non-specific changes and very atypical for ACS given how reproducible her pain is. She is currently hemodynamically stable.  IV was established.  Will CT chest, abdomen and pelvis. NPO. Labs. Pain control. Close monitoring.     Final Clinical Impressions(s) / ED Diagnoses   Final diagnoses:  Gastrointestinal hemorrhage, unspecified gastrointestinal hemorrhage type  PUD (peptic ulcer disease)    ED Discharge Orders    None       Virgel Manifold, MD 02/09/18 [phone]

## 2018-02-08 NOTE — H&P (Signed)
History and Physical  Jacqueline Haney VZC:[id number] DOB: [birth year] DOA: 02/08/2018  Referring physician: ER physician, Dr. Wilson Singer PCP: Jani Gravel, MD  Outpatient Specialists:    Patient coming from: Home  Chief Complaint: Abdominal pain and severe back pain earlier today.  HPI:  Patient is a 62 year old African-American female with past medical history significant for migraine and hyperlipidemia.  The patient has been using a significant amount of ibuprofen.  According to the patient and the patient's niece, the patient takes about 10 tablets of ibuprofen daily.  Patient developed severe epigastric pain earlier today, with associated severe back pain.  Currently, the patient has developed generalized abdominal pain.  No fever or chills.  Leukocytosis is noted.  CT scan of the abdomen and chest with contrast done earlier today revealed retroperitoneal bleed, heterogeneous enhancement of the head of the pancreas, severe wall thickening and edema of the duodenum.  CO2 is 20.  No headache, no neck pain, no chest pain, no shortness of breath, no vomiting no change in bowel habit.  The patient reports feeling nauseous.  X-ray of the abdomen did not reveal any free air.  The lactic acid is 1.1.  UA revealed small acute hemoglobin and specific gravity of 1.031.  The ER physician, Dr. Wilson Singer, discussed the case with the surgical team on-call, Dr. Arnoldo Morale and Dr. Arnoldo Morale as advised the hospitalist team to admit the patient.  ED Course: Patient is kept n.p.o.  The patient is volume resuscitated.  And the patient has received IV Zosyn. Pertinent labs: BMP reveals sodium of 140, potassium of 3.5, chloride 110, CO2 20, BUN of 16 and creatinine of 0.6 with blood sugar of 131.  Alkaline phosphatase is 70, magnesium is 1.7, phosphorus of 4.5, albumin of 3.8, lipase of 22, AST of 23, ALT of 24, total protein of 7.1 with a total bilirubin of 0.5.  First troponin was less than 0.03.  Lactic acid is 1.1. EKG:  Independently reviewed.  Imaging: independently reviewed.   Review of Systems:  As in history of presenting illness.  12 systems were reviewed. Negative for fever, visual changes, sore throat, rash, new muscle aches, chest pain, SOB, dysuria, bleeding.  Past Medical History:  Diagnosis Date  . Chest pain    ACS rules out stress test negative  . Hyperlipidemia    refuses meds/diet controlled  . Migraine     Past Surgical History:  Procedure Laterality Date  . ABDOMINAL HYSTERECTOMY    . SPLENECTOMY       reports that she has been smoking cigarettes.  She has been smoking about 0.50 packs per day. She has never used smokeless tobacco. She reports that she does not drink alcohol or use drugs.  Allergies  Allergen Reactions  . Aspirin Nausea And Vomiting  . Codeine Other (See Comments)    Alters mental status "climbs walls/feels high"  . Dilaudid [Hydromorphone Hcl]     Severe nausea and vomiting.  Marland Kitchen Hydrocodone Other (See Comments)    Alters mental status "climbs walls/feels high"  . Oxycodone-Acetaminophen Other (See Comments)    Alters mental status "climbing a while/high"  . Tape Dermatitis    Family History  Problem Relation Age of Onset  . Cancer Mother   . Cancer Father   . Kidney failure Sister   . Heart attack Sister   . Cancer Brother   . Cancer Other      Prior to Admission medications   Medication Sig Start Date End Date Taking? Authorizing Provider  ibuprofen (ADVIL,MOTRIN) 200 MG tablet Take 800 mg by mouth every 6 (six) hours as needed.   Yes [provider]  SUMAtriptan (IMITREX) 50 MG tablet Take 50 mg by mouth every 2 (two) hours as needed for migraine or headache.  09/16/14  Yes [provider]    Physical Exam: Vitals:   02/08/18 1600 02/08/18 1630 02/08/18 1700 02/08/18 1730  BP: 124/77 110/76 107/75 103/90  Pulse: 71 72 79 76  Resp: 17 15 17  (!) 26  Temp:      TempSrc:      SpO2: 94% 91% 96% 94%  Weight:      Height:          Constitutional:  . In some painful distress.   . No pallor. No jaundice.  ENMT:  . external ears, nose appear normal Neck:  . Neck is supple. No JVD Respiratory:  . CTA bilaterally, no w/r/r.  . Respiratory effort normal. No retractions or accessory muscle use Cardiovascular:  . S1S2 . No LE extremity edema   Abdomen:  . Abdomen is severely tender globally. Organs are difficult to assess. Neurologic:  . Awake and alert. . Moves all limbs.  Wt Readings from Last 3 Encounters:  02/08/18 83.5 kg (184 lb)  10/06/17 81.6 kg (180 lb)  09/22/17 81.6 kg (180 lb)    I have personally reviewed following labs and imaging studies  Labs on Admission:  CBC: Recent Labs  Lab 02/08/18 1408  WBC 15.4*  HGB 13.9  HCT 41.3  MCV 87.7  PLT 161   Basic Metabolic Panel: Recent Labs  Lab 02/08/18 1408  NA 140  K 3.5  CL 110  CO2 20*  GLUCOSE 131*  BUN 16  CREATININE 0.60  CALCIUM 9.2   Liver Function Tests: Recent Labs  Lab 02/08/18 1408  AST 23  ALT 24  ALKPHOS 70  BILITOT 0.5  PROT 7.1  ALBUMIN 3.8   Recent Labs  Lab 02/08/18 1408  LIPASE 22   No results for input(s): AMMONIA in the last 168 hours. Coagulation Profile: No results for input(s): INR, PROTIME in the last 168 hours. Cardiac Enzymes: Recent Labs  Lab 02/08/18 1431  TROPONINI <0.03   BNP (last 3 results) No results for input(s): PROBNP in the last 8760 hours. HbA1C: No results for input(s): HGBA1C in the last 72 hours. CBG: No results for input(s): GLUCAP in the last 168 hours. Lipid Profile: No results for input(s): CHOL, HDL, LDLCALC, TRIG, CHOLHDL, LDLDIRECT in the last 72 hours. Thyroid Function Tests: No results for input(s): TSH, T4TOTAL, FREET4, T3FREE, THYROIDAB in the last 72 hours. Anemia Panel: No results for input(s): VITAMINB12, FOLATE, FERRITIN, TIBC, IRON, RETICCTPCT in the last 72 hours. Urine analysis:    Component Value Date/Time   COLORURINE YELLOW  02/08/2018 1600   APPEARANCEUR CLEAR 02/08/2018 1600   LABSPEC 1.031 (H) 02/08/2018 1600   PHURINE 5.0 02/08/2018 1600   GLUCOSEU NEGATIVE 02/08/2018 1600   HGBUR SMALL (A) 02/08/2018 1600   BILIRUBINUR NEGATIVE 02/08/2018 1600   KETONESUR NEGATIVE 02/08/2018 1600   PROTEINUR 30 (A) 02/08/2018 1600   UROBILINOGEN 0.2 05/16/2012 0213   NITRITE NEGATIVE 02/08/2018 1600   LEUKOCYTESUR NEGATIVE 02/08/2018 1600   Sepsis Labs: @LABRCNTIP (procalcitonin:4,lacticidven:4) )No results found for this or any previous visit (from the past 240 hour(s)).    Radiological Exams on Admission: Dg Abdomen Acute W/chest  Result Date: 02/08/2018 CLINICAL DATA:  Acute onset abdominal and back pain. Evaluate for pneumoperitoneum. EXAM: DG ABDOMEN  ACUTE W/ 1V CHEST COMPARISON:  CT abdomen pelvis dated August 21, 2017. Chest x-ray dated October 08, 2016. FINDINGS: There is no evidence of dilated bowel loops or free intraperitoneal air. No radiopaque calculi or other significant radiographic abnormality is seen. Heart size and mediastinal contours are within normal limits. Both lungs are clear. No acute osseous abnormality. IMPRESSION: Negative abdominal radiographs.  No acute cardiopulmonary disease. Electronically Signed   By: Titus Dubin M.D.   On: 02/08/2018 15:28   Ct Angio Chest/abd/pel For Dissection W And/or Wo Contrast  Result Date: 02/08/2018 CLINICAL DATA:  Severe abdominal pain and back pain EXAM: CT ANGIOGRAPHY CHEST, ABDOMEN AND PELVIS TECHNIQUE: Multidetector CT imaging through the chest, abdomen and pelvis was performed using the standard protocol during bolus administration of intravenous contrast. Multiplanar reconstructed images and MIPs were obtained and reviewed to evaluate the vascular anatomy. CONTRAST:  162mL ISOVUE-370 IOPAMIDOL (ISOVUE-370) INJECTION 76% COMPARISON:  08/21/2017.  05/10/2013. FINDINGS: CTA CHEST FINDINGS Cardiovascular: There is no evidence of acute intramural hematoma or  aortic dissection. Maximal diameter of the ascending aorta is 3.4 cm. There is no obvious evidence of acute pulmonary thromboembolism. Great vessels are patent. Vertebral arteries are patent within the confines of the study. No significant coronary artery calcifications. Mediastinum/Nodes: No abnormal mediastinal adenopathy. No pericardial effusion. Visualized thyroid is unremarkable. Lungs/Pleura: No pneumothorax. No pleural effusion. Minimal scarring at the lung apices. Minimal dependent atelectasis in the lungs. Musculoskeletal: No vertebral compression deformity. No acute rib fracture. Review of the MIP images confirms the above findings. CTA ABDOMEN AND PELVIS FINDINGS VASCULAR Aorta: No evidence of aortic dissection or aneurysm. Aorta is patent. Mild atherosclerotic calcifications and minimal soft plaque in the lower abdominal aorta. 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 Branch vessels are patent. Accessory left hepatic artery anatomy. SMA: Patent. Renals: Single renal arteries are patent. IMA: Patent. Inflow: Bilateral common iliac, external iliac, and internal iliac arteries are patent. Review of the MIP images confirms the above findings. NON-VASCULAR Hepatobiliary: Heterogeneous enhancement likely due to arterial phase imaging. No focal mass. Gallbladder is within normal limits. Pancreas: There is stranding and blurring of the fat planes in the head of the pancreas. There is heterogeneous enhancement in the head of the pancreas. These findings support acute pancreatitis. There is no evidence of pancreatic necrosis. There is stranding which extends from the head of the pancreas inferiorly, surrounding the duodenum, and into the retroperitoneum. Hounsfield unit measurements are elevated. These findings are worrisome for retroperitoneal hemorrhage. Soft tissue mass would be a secondary consideration. Spleen: Post splenectomy. Adrenals/Urinary Tract: Tiny calculus in the lower pole  of the right kidney. Left kidney is unremarkable. Stable left adrenal nodule. Unremarkable bladder. Stomach/Bowel: There is severe wall thickening and edema of the duodenum, associated with inflammation of the pancreatic head. As described above, high density stranding around the duodenum is noted worrisome for retroperitoneal hemorrhage. No evidence of small-bowel obstruction. Normal appendix. No obvious mass in the colon. Lymphatic: No abnormal retroperitoneal adenopathy. Reproductive: Uterus is absent.  Adnexa are within normal limits. Other: No free fluid. Musculoskeletal: No vertebral compression deformity. Review of the MIP images confirms the above findings. IMPRESSION: Vascular: No evidence of aortic dissection. Nonvascular: There is retroperitoneal hemorrhage associated with the head of the pancreas and duodenum as described above. These may represent a complication of acute pancreatitis. There is no evidence of pancreatic necrosis. Alternatively, the soft tissue density may represent neoplasm rather than hemorrhage. Electronically Signed   By: Rodena Goldmann.D.  On: 02/08/2018 16:06    EKG: Independently reviewed.   Active Problems:   Retroperitoneal bleed   Assessment/Plan 1. Likely perforated viscus, suspect posterior perforation of the duodenum. 2. Likely peritonitis. 3. NSAID abuse. 4. Retroperitoneal bleed. 5. Heterogeneous enhancements of the pancreatic head, with normal lipase level.   We will admit patient to stepdown unit at [facility] under Dr. Hal Hope.  N.p.o.  IV antibiotics  The ER physician, Dr. Wilson Singer has already discussed with the surgical team over at [facility] and they will see patient in consultation.    Continue Protonix drip.  Monitor closely  IV fluid  Monitor renal function and electrolytes.  Guarded prognosis  DVT prophylaxis: SCD Code Status: Full Family Communication: Daughter, nieces, granddaughter Disposition Plan:  Patient will be admitted to stepdown unit at [facility] called: ER physician has discussed with the surgical team Admission status: Inpatient  Time spent: 70 minutes  Dana Allan, MD  [facility] Pager #: [phone] 7PM-7AM contact night coverage as above   02/08/2018, 7:10 PM

## 2018-02-08 NOTE — ED Notes (Signed)
Pt to ct 

## 2018-02-08 NOTE — ED Notes (Signed)
RN spoke with Dr Darrick Meigs reference the potassium order, Dr Darrick Meigs advised to discontinue remaining potassium runs,

## 2018-02-08 NOTE — ED Notes (Signed)
Admitting doctor notified this nurse as well as Network engineer to page Dr. Arnoldo Morale to come see pt. Secretary now Occupational psychologist

## 2018-02-08 NOTE — ED Notes (Signed)
Pt and family updated on plan of care,  

## 2018-02-08 NOTE — ED Notes (Signed)
Have notified pharmacy

## 2018-02-08 NOTE — ED Triage Notes (Addendum)
Pt here for severe abdominal pain. States she is also having a lot of back pain. BP possibly hypotensive with EMS. Hot.  diaphoretic, and weak. Last BP 99/74, HR 90's. 100% RA. Pt is complaining of a RIPPING sensation in stomach

## 2018-02-09 DIAGNOSIS — R1013 Epigastric pain: Secondary | ICD-10-CM | POA: Diagnosis present

## 2018-02-09 DIAGNOSIS — F1990 Other psychoactive substance use, unspecified, uncomplicated: Secondary | ICD-10-CM | POA: Diagnosis present

## 2018-02-09 DIAGNOSIS — R933 Abnormal findings on diagnostic imaging of other parts of digestive tract: Secondary | ICD-10-CM

## 2018-02-09 DIAGNOSIS — R198 Other specified symptoms and signs involving the digestive system and abdomen: Secondary | ICD-10-CM

## 2018-02-09 DIAGNOSIS — I774 Celiac artery compression syndrome: Secondary | ICD-10-CM | POA: Diagnosis present

## 2018-02-09 DIAGNOSIS — R58 Hemorrhage, not elsewhere classified: Secondary | ICD-10-CM

## 2018-02-09 LAB — CBG MONITORING, ED: Glucose-Capillary: 134 mg/dL — ABNORMAL HIGH (ref 70–99)

## 2018-02-09 LAB — CBC
HCT: 35.4 % — ABNORMAL LOW (ref 36.0–46.0)
HEMOGLOBIN: 11.9 g/dL — AB (ref 12.0–15.0)
MCH: 29.1 pg (ref 26.0–34.0)
MCHC: 33.6 g/dL (ref 30.0–36.0)
MCV: 86.6 fL (ref 78.0–100.0)
Platelets: 287 10*3/uL (ref 150–400)
RBC: 4.09 MIL/uL (ref 3.87–5.11)
RDW: 13.6 % (ref 11.5–15.5)
WBC: 15.9 10*3/uL — AB (ref 4.0–10.5)

## 2018-02-09 LAB — SURGICAL PCR SCREEN
MRSA, PCR: NEGATIVE
Staphylococcus aureus: NEGATIVE

## 2018-02-09 MED ORDER — KETOROLAC TROMETHAMINE 15 MG/ML IJ SOLN
15.0000 mg | Freq: Once | INTRAMUSCULAR | Status: AC
  Administered 2018-02-09: 15 mg via INTRAVENOUS
  Filled 2018-02-09: qty 1

## 2018-02-09 MED ORDER — DIPHENHYDRAMINE HCL 50 MG/ML IJ SOLN
25.0000 mg | Freq: Once | INTRAMUSCULAR | Status: DC
  Filled 2018-02-09: qty 1

## 2018-02-09 MED ORDER — SODIUM CHLORIDE 0.9 % IV SOLN
INTRAVENOUS | Status: DC
Start: 2018-02-09 — End: 2018-02-10

## 2018-02-09 MED ORDER — SODIUM CHLORIDE 0.45 % IV SOLN
INTRAVENOUS | Status: DC
  Administered 2018-02-09 – 2018-02-10 (×3): via INTRAVENOUS

## 2018-02-09 MED ORDER — PIPERACILLIN-TAZOBACTAM 3.375 G IVPB
3.3750 g | Freq: Three times a day (TID) | INTRAVENOUS | Status: DC
  Administered 2018-02-09 – 2018-02-12 (×10): 3.375 g via INTRAVENOUS
  Filled 2018-02-09 (×11): qty 50

## 2018-02-09 MED ORDER — SUMATRIPTAN SUCCINATE 50 MG PO TABS
50.0000 mg | ORAL_TABLET | ORAL | Status: DC | PRN
  Administered 2018-02-09 – 2018-02-11 (×5): 50 mg via ORAL
  Filled 2018-02-09 (×7): qty 1

## 2018-02-09 MED ORDER — PIPERACILLIN-TAZOBACTAM 3.375 G IVPB 30 MIN
3.3750 g | Freq: Three times a day (TID) | INTRAVENOUS | Status: DC
  Administered 2018-02-09: 3.375 g via INTRAVENOUS
  Filled 2018-02-09: qty 50

## 2018-02-09 MED ORDER — HYDROMORPHONE HCL 1 MG/ML IJ SOLN
1.0000 mg | INTRAMUSCULAR | Status: DC | PRN
  Administered 2018-02-09 – 2018-02-11 (×2): 1 mg via INTRAVENOUS
  Filled 2018-02-09 (×2): qty 1

## 2018-02-09 MED ORDER — METOCLOPRAMIDE HCL 5 MG/ML IJ SOLN
10.0000 mg | Freq: Once | INTRAMUSCULAR | Status: AC
  Administered 2018-02-09: 10 mg via INTRAVENOUS
  Filled 2018-02-09: qty 2

## 2018-02-09 MED ORDER — ONDANSETRON HCL 4 MG/2ML IJ SOLN
4.0000 mg | Freq: Four times a day (QID) | INTRAMUSCULAR | Status: DC | PRN
  Administered 2018-02-09 – 2018-02-11 (×2): 4 mg via INTRAVENOUS
  Filled 2018-02-09 (×2): qty 2

## 2018-02-09 NOTE — H&P (View-Only) (Signed)
Consultation  Referring Provider:  Dr. Bonner Puna    Primary Care Physician:  Jani Gravel, MD Primary Gastroenterologist:  Althia Forts       Reason for Consultation: Abdominal pain         Impression / Plan:   Impression: 1.  Epigastric Abdominal pain: radiates into back, thought related to perforated viscus suspected due to posterior perforation of the duodenum from likely ulcer, H/o ibuprofen usage 10 tabs per day for migraine headaches for "years"; most likely duodenal ulcer 2. Abnormal Ct abdomen: likely related to above, though question of pancreatitis vs hemorrhage vs mass-normal lipase 22 on 02/08/18  Plan: 1.  Continue Protonix drip 2.  Continue pain medications 3.  Will place patient on clear liquid diet for now 4.   EGD scheduled for tomorrow. Did discuss risks, benefits, limitations and alternatives and the patient agrees to proceed. 5. Please await further recommendations from Dr. Carlean Purl later today  Thank you for your kind consultation, we will continue to follow.  Jacqueline Haney  02/09/2018, 12:04 PM Pager #: [phone]    [facility] Attending   I have taken an interval history, reviewed the chart and examined the patient. I agree with the Advanced Practitioner's note, impression and recommendations.   I ordered some Dilaudid - she has bad pain and in my experience a duodenal ulcer in this area can be quite painful. I removed it from allergy/SE list as she told me she is not allergic or did not have problems she told staff she had problems due to "not wanting to be hooked on it"        Gatha Mayer, MD, [facility] 02/09/2018 7:21 PM   HPI:   Jacqueline Haney is a 62 y.o. female with a past medical history as listed below, who presented to the ER 02/08/2018 for severe abdominal and back pain.    Today, explains she had been using a significant amount of ibuprofen, at least 10 tablets of ibuprofen a day for years for migraines and  developed severe epigastric pain yesterday with associated severe back pain this developed at her nephew's funeral.  Pain was a 10/10 in the ER, no pain today as long as she lays still, still 9/10 if touched.  Vomited one time during transport from [facility] and again this morning.  Last normal bowel movement 02/07/2018.    Denies heartburn, reflux, bright red blood or black tarry stools.  ED course: CT scan of abdomen chest with contrast revealed retroperitoneal bleed, heterogenous enhancement of the head of the pancreas, severe wall thickening and edema of the duodenum; x-ray with no free air, lactic acid 1.1; seen by surgical team who recommended GI consult  Past Medical History:  Diagnosis Date  . Chest pain    ACS rules out stress test negative  . Hyperlipidemia    refuses meds/diet controlled  . Migraine     Past Surgical History:  Procedure Laterality Date  . ABDOMINAL HYSTERECTOMY    . SPLENECTOMY      Family History  Problem Relation Age of Onset  . Cancer Mother   . Cancer Father   . Kidney failure Sister   . Heart attack Sister   . Cancer Brother   . Cancer Other      Social History   Tobacco Use  . Smoking status: Current Every Day Smoker    Packs/day: 0.50    Types: Cigarettes  . Smokeless tobacco: Never Used  Substance  Use Topics  . Alcohol use: No  . Drug use: No    Prior to Admission medications   Medication Sig Start Date End Date Taking? Authorizing Provider  ibuprofen (ADVIL,MOTRIN) 200 MG tablet Take 800 mg by mouth every 6 (six) hours as needed.   Yes [provider]  SUMAtriptan (IMITREX) 50 MG tablet Take 50 mg by mouth every 2 (two) hours as needed for migraine or headache.  09/16/14  Yes [provider]    Current Facility-Administered Medications  Medication Dose Route Frequency Provider Last Rate Last Dose  . diphenhydrAMINE (BENADRYL) injection 25 mg  25 mg Intravenous Once Schorr, Rhetta Mura, NP      . pantoprazole  (PROTONIX) 80 mg in sodium chloride 0.9 % 250 mL (0.32 mg/mL) infusion  8 mg/hr Intravenous Continuous Dana Allan I, MD 25 mL/hr at 02/08/18 1710 8 mg/hr at 02/08/18 1710  . piperacillin-tazobactam (ZOSYN) IVPB 3.375 g  3.375 g Intravenous Q8H Dana Allan I, MD 12.5 mL/hr at 02/09/18 0556 3.375 g at 02/09/18 0556  . sodium bicarbonate 150 mEq in dextrose 5 % 1,000 mL infusion   Intravenous Continuous Dana Allan I, MD 100 mL/hr at 02/08/18 2115    . SUMAtriptan (IMITREX) tablet 50 mg  50 mg Oral Q2H PRN Patrecia Pour, MD   50 mg at 02/09/18 0914    Allergies as of 02/08/2018 - Review Complete 02/08/2018  Allergen Reaction Noted  . Aspirin Nausea And Vomiting   . Codeine Other (See Comments)   . Dilaudid [hydromorphone hcl]  06/22/2015  . Hydrocodone Other (See Comments)   . Oxycodone-acetaminophen Other (See Comments)   . Tape Dermatitis 05/16/2012     Review of Systems:    Constitutional: No weight loss, fever or chills Skin: No rash  Cardiovascular: No chest pain Respiratory: No SOB Gastrointestinal: See HPI and otherwise negative Genitourinary: No dysuria Neurological: +chronic migraines Musculoskeletal: No new muscle or joint pain Hematologic: No bruising Psychiatric: No history of depression or anxiety   Physical Exam:  Vital signs in last 24 hours: Temp:  [97.8 F (36.6 C)-99 F (37.2 C)] 98.1 F (36.7 C) (06/27 0738) Pulse Rate:  [61-79] 61 (06/27 0738) Resp:  [13-26] 13 (06/27 0738) BP: (91-130)/(58-90) 113/72 (06/27 0738) SpO2:  [88 %-98 %] 96 % (06/27 0738) Weight:  [184 lb (83.5 kg)] 184 lb (83.5 kg) (06/26 1354) Last BM Date: (PTA) General:   Pleasant AA female appears to be in NAD, Well developed, Well nourished, alert and cooperative Head:  Normocephalic and atraumatic. Eyes:   PEERL, EOMI. No icterus. Conjunctiva pink. Ears:  Normal auditory acuity. Neck:  Supple Throat: Oral cavity and pharynx without inflammation, swelling or lesion.    Lungs: Respirations even and unlabored. Lungs clear to auscultation bilaterally.   No wheezes, crackles, or rhonchi.  Heart: Normal S1, S2. No MRG. Regular rate and rhythm. No peripheral edema, cyanosis or pallor.  Abdomen:  Soft, nondistended, marked epigastric ttp with involuntary guarding, Normal bowel sounds. No appreciable masses or hepatomegaly. Rectal:  Not performed.  Msk:  Symmetrical without gross deformities. Peripheral pulses intact.  Extremities:  Without edema, no deformity or joint abnormality.  Neurologic:  Alert and  oriented x4;  grossly normal neurologically.   Skin:   Dry and intact without significant lesions or rashes. Psychiatric: Demonstrates good judgement and reason without abnormal affect or behaviors.  LAB RESULTS: Recent Labs    02/08/18 1408 02/08/18 1904  WBC 15.4* 19.6*  HGB 13.9 13.1  HCT  41.3 39.4  PLT 199 297   BMET Recent Labs    02/08/18 1408  NA 140  K 3.5  CL 110  CO2 20*  GLUCOSE 131*  BUN 16  CREATININE 0.60  CALCIUM 9.2   LFT Recent Labs    02/08/18 1408  PROT 7.1  ALBUMIN 3.8  AST 23  ALT 24  ALKPHOS 70  BILITOT 0.5   PT/INR Recent Labs    02/08/18 1904  LABPROT 13.1  INR 1.00    STUDIES: Dg Abdomen Acute W/chest  Result Date: 02/08/2018 CLINICAL DATA:  Acute onset abdominal and back pain. Evaluate for pneumoperitoneum. EXAM: DG ABDOMEN ACUTE W/ 1V CHEST COMPARISON:  CT abdomen pelvis dated August 21, 2017. Chest x-ray dated October 08, 2016. FINDINGS: There is no evidence of dilated bowel loops or free intraperitoneal air. No radiopaque calculi or other significant radiographic abnormality is seen. Heart size and mediastinal contours are within normal limits. Both lungs are clear. No acute osseous abnormality. IMPRESSION: Negative abdominal radiographs.  No acute cardiopulmonary disease. Electronically Signed   By: Titus Dubin M.D.   On: 02/08/2018 15:28   Ct Angio Chest/abd/pel For Dissection W And/or Wo  Contrast  Result Date: 02/08/2018 CLINICAL DATA:  Severe abdominal pain and back pain EXAM: CT ANGIOGRAPHY CHEST, ABDOMEN AND PELVIS TECHNIQUE: Multidetector CT imaging through the chest, abdomen and pelvis was performed using the standard protocol during bolus administration of intravenous contrast. Multiplanar reconstructed images and MIPs were obtained and reviewed to evaluate the vascular anatomy. CONTRAST:  186mL ISOVUE-370 IOPAMIDOL (ISOVUE-370) INJECTION 76% COMPARISON:  08/21/2017.  05/10/2013. FINDINGS: CTA CHEST FINDINGS Cardiovascular: There is no evidence of acute intramural hematoma or aortic dissection. Maximal diameter of the ascending aorta is 3.4 cm. There is no obvious evidence of acute pulmonary thromboembolism. Great vessels are patent. Vertebral arteries are patent within the confines of the study. No significant coronary artery calcifications. Mediastinum/Nodes: No abnormal mediastinal adenopathy. No pericardial effusion. Visualized thyroid is unremarkable. Lungs/Pleura: No pneumothorax. No pleural effusion. Minimal scarring at the lung apices. Minimal dependent atelectasis in the lungs. Musculoskeletal: No vertebral compression deformity. No acute rib fracture. Review of the MIP images confirms the above findings. CTA ABDOMEN AND PELVIS FINDINGS VASCULAR Aorta: No evidence of aortic dissection or aneurysm. Aorta is patent. Mild atherosclerotic calcifications and minimal soft plaque in the lower abdominal aorta. 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 Branch vessels are patent. Accessory left hepatic artery anatomy. SMA: Patent. Renals: Single renal arteries are patent. IMA: Patent. Inflow: Bilateral common iliac, external iliac, and internal iliac arteries are patent. Review of the MIP images confirms the above findings. NON-VASCULAR Hepatobiliary: Heterogeneous enhancement likely due to arterial phase imaging. No focal mass. Gallbladder is within normal limits.  Pancreas: There is stranding and blurring of the fat planes in the head of the pancreas. There is heterogeneous enhancement in the head of the pancreas. These findings support acute pancreatitis. There is no evidence of pancreatic necrosis. There is stranding which extends from the head of the pancreas inferiorly, surrounding the duodenum, and into the retroperitoneum. Hounsfield unit measurements are elevated. These findings are worrisome for retroperitoneal hemorrhage. Soft tissue mass would be a secondary consideration. Spleen: Post splenectomy. Adrenals/Urinary Tract: Tiny calculus in the lower pole of the right kidney. Left kidney is unremarkable. Stable left adrenal nodule. Unremarkable bladder. Stomach/Bowel: There is severe wall thickening and edema of the duodenum, associated with inflammation of the pancreatic head. As described above, high density stranding around the duodenum  is noted worrisome for retroperitoneal hemorrhage. No evidence of small-bowel obstruction. Normal appendix. No obvious mass in the colon. Lymphatic: No abnormal retroperitoneal adenopathy. Reproductive: Uterus is absent.  Adnexa are within normal limits. Other: No free fluid. Musculoskeletal: No vertebral compression deformity. Review of the MIP images confirms the above findings. IMPRESSION: Vascular: No evidence of aortic dissection. Nonvascular: There is retroperitoneal hemorrhage associated with the head of the pancreas and duodenum as described above. These may represent a complication of acute pancreatitis. There is no evidence of pancreatic necrosis. Alternatively, the soft tissue density may represent neoplasm rather than hemorrhage. Electronically Signed   By: Marybelle Killings M.D.   On: 02/08/2018 16:06

## 2018-02-09 NOTE — Consult Note (Signed)
Reason for Consult: ulcer with concern for bleeding Referring Physician: Ivry, Pigue is an 62 y.o. female.  HPI: 62 yo femal eiwht 1 day of back pain and intense periumbilical pain. THe pain was very intense and constant beginning at her nephew's funeral and then getting worse throughout the day. Pain in ER was 10/10, currently she has almost no pain. She vomited 1 time during transport from [employer]. She has never had a pain like this.   Past Medical History:  Diagnosis Date  . Chest pain    ACS rules out stress test negative  . Hyperlipidemia    refuses meds/diet controlled  . Migraine     Past Surgical History:  Procedure Laterality Date  . ABDOMINAL HYSTERECTOMY    . SPLENECTOMY      Family History  Problem Relation Age of Onset  . Cancer Mother   . Cancer Father   . Kidney failure Sister   . Heart attack Sister   . Cancer Brother   . Cancer Other     Social History:  reports that she has been smoking cigarettes.  She has been smoking about 0.50 packs per day. She has never used smokeless tobacco. She reports that she does not drink alcohol or use drugs.  Allergies:  Allergies  Allergen Reactions  . Aspirin Nausea And Vomiting  . Codeine Other (See Comments)    Alters mental status "climbs walls/feels high"  . Dilaudid [Hydromorphone Hcl]     Severe nausea and vomiting.  Marland Kitchen Hydrocodone Other (See Comments)    Alters mental status "climbs walls/feels high"  . Oxycodone-Acetaminophen Other (See Comments)    Alters mental status "climbing a while/high"  . Tape Dermatitis    Medications: I have reviewed the patient's current medications.  Results for orders placed or performed during the hospital encounter of 02/08/18 (from the past 48 hour(s))  Lipase, blood     Status: None   Collection Time: 02/08/18  2:08 PM  Result Value Ref Range   Lipase 22 11 - 51 U/L    Comment: Performed at [facility], [address], [place], [place] [address]   Comprehensive metabolic panel     Status: Abnormal   Collection Time: 02/08/18  2:08 PM  Result Value Ref Range   Sodium 140 135 - 145 mmol/L   Potassium 3.5 3.5 - 5.1 mmol/L   Chloride 110 98 - 111 mmol/L    Comment: Please note change in reference range.   CO2 20 (L) 22 - 32 mmol/L   Glucose, Bld 131 (H) 70 - 99 mg/dL    Comment: Please note change in reference range.   BUN 16 8 - 23 mg/dL    Comment: Please note change in reference range.   Creatinine, Ser 0.60 0.44 - 1.00 mg/dL   Calcium 9.2 8.9 - 10.3 mg/dL   Total Protein 7.1 6.5 - 8.1 g/dL   Albumin 3.8 3.5 - 5.0 g/dL   AST 23 15 - 41 U/L   ALT 24 0 - 44 U/L    Comment: Please note change in reference range.   Alkaline Phosphatase 70 38 - 126 U/L   Total Bilirubin 0.5 0.3 - 1.2 mg/dL   GFR calc non Af Amer >60 >60 mL/min   GFR calc Af Amer >60 >60 mL/min    Comment: (NOTE) The eGFR has been calculated using the CKD EPI equation. This calculation has not been validated in all clinical situations. eGFR's persistently <60  mL/min signify possible Chronic Kidney Disease.    Anion gap 10 5 - 15    Comment: Performed at [facility], [address], [place], [place] [address]  CBC     Status: Abnormal   Collection Time: 02/08/18  2:08 PM  Result Value Ref Range   WBC 15.4 (H) 4.0 - 10.5 K/uL   RBC 4.71 3.87 - 5.11 MIL/uL   Hemoglobin 13.9 12.0 - 15.0 g/dL   HCT 41.3 36.0 - 46.0 %   MCV 87.7 78.0 - 100.0 fL   MCH 29.5 26.0 - 34.0 pg   MCHC 33.7 30.0 - 36.0 g/dL   RDW 13.7 11.5 - 15.5 %   Platelets 199 150 - 400 K/uL    Comment: Performed at [facility], [address] [place], [place] [address]  Troponin I     Status: None   Collection Time: 02/08/18  2:31 PM  Result Value Ref Range   Troponin I <0.03 <0.03 ng/mL    Comment: Performed at [facility], [address], [place], [place] [address]  Type and screen [facility]     Status: None   Collection Time: 02/08/18  2:35 PM  Result Value Ref Range    ABO/RH(D) O POS    Antibody Screen NEG    Sample Expiration      02/11/2018 Performed at [facility], [address], [place], [place] [address]   Urinalysis, Routine w reflex microscopic     Status: Abnormal   Collection Time: 02/08/18  4:00 PM  Result Value Ref Range   Color, Urine YELLOW YELLOW   APPearance CLEAR CLEAR   Specific Gravity, Urine 1.031 (H) 1.005 - 1.030   pH 5.0 5.0 - 8.0   Glucose, UA NEGATIVE NEGATIVE mg/dL   Hgb urine dipstick SMALL (A) NEGATIVE   Bilirubin Urine NEGATIVE NEGATIVE   Ketones, ur NEGATIVE NEGATIVE mg/dL   Protein, ur 30 (A) NEGATIVE mg/dL   Nitrite NEGATIVE NEGATIVE   Leukocytes, UA NEGATIVE NEGATIVE   RBC / HPF 0-5 0 - 5 RBC/hpf   WBC, UA 0-5 0 - 5 WBC/hpf   Bacteria, UA NONE SEEN NONE SEEN   Squamous Epithelial / LPF 0-5 0 - 5   Mucus PRESENT     Comment: Performed at [facility], [address], [place], [place] [address]  Lactic acid, plasma     Status: None   Collection Time: 02/08/18  7:04 PM  Result Value Ref Range   Lactic Acid, Venous 1.1 0.5 - 1.9 mmol/L    Comment: Performed at [facility], [address], [place], [place] [address]  CBC with Differential     Status: Abnormal   Collection Time: 02/08/18  7:04 PM  Result Value Ref Range   WBC 19.6 (H) 4.0 - 10.5 K/uL   RBC 4.49 3.87 - 5.11 MIL/uL   Hemoglobin 13.1 12.0 - 15.0 g/dL   HCT 39.4 36.0 - 46.0 %   MCV 87.8 78.0 - 100.0 fL   MCH 29.2 26.0 - 34.0 pg   MCHC 33.2 30.0 - 36.0 g/dL   RDW 13.8 11.5 - 15.5 %   Platelets 297 150 - 400 K/uL   Neutrophils Relative % 80 %   Neutro Abs 15.5 (H) 1.7 - 7.7 K/uL   Lymphocytes Relative 16 %   Lymphs Abs 3.2 0.7 - 4.0 K/uL   Monocytes Relative 4 %   Monocytes Absolute 0.9 0.1 - 1.0 K/uL   Eosinophils Relative 0 %  Eosinophils Absolute 0.0 0.0 - 0.7 K/uL   Basophils Relative 0 %   Basophils Absolute 0.0 0.0 - 0.1 K/uL    Comment: Performed at [facility], [address] [place], [place] [address]  Magnesium     Status: None    Collection Time: 02/08/18  7:04 PM  Result Value Ref Range   Magnesium 1.7 1.7 - 2.4 mg/dL    Comment: Performed at [facility], [address], [place], [place] [address]  Phosphorus     Status: None   Collection Time: 02/08/18  7:04 PM  Result Value Ref Range   Phosphorus 4.5 2.5 - 4.6 mg/dL    Comment: Performed at [facility], [address] [place], [place] [address]  Protime-INR     Status: None   Collection Time: 02/08/18  7:04 PM  Result Value Ref Range   Prothrombin Time 13.1 11.4 - 15.2 seconds   INR 1.00     Comment: Performed at [facility], [address], [place], [place] [address]  CBG monitoring, ED     Status: Abnormal   Collection Time: 02/08/18  9:45 PM  Result Value Ref Range   Glucose-Capillary 142 (H) 70 - 99 mg/dL  CBG monitoring, ED     Status: Abnormal   Collection Time: 02/09/18  1:28 AM  Result Value Ref Range   Glucose-Capillary 134 (H) 70 - 99 mg/dL    Dg Abdomen Acute W/chest  Result Date: 02/08/2018 CLINICAL DATA:  Acute onset abdominal and back pain. Evaluate for pneumoperitoneum. EXAM: DG ABDOMEN ACUTE W/ 1V CHEST COMPARISON:  CT abdomen pelvis dated August 21, 2017. Chest x-ray dated October 08, 2016. FINDINGS: There is no evidence of dilated bowel loops or free intraperitoneal air. No radiopaque calculi or other significant radiographic abnormality is seen. Heart size and mediastinal contours are within normal limits. Both lungs are clear. No acute osseous abnormality. IMPRESSION: Negative abdominal radiographs.  No acute cardiopulmonary disease. Electronically Signed   By: Titus Dubin M.D.   On: 02/08/2018 15:28   Ct Angio Chest/abd/pel For Dissection W And/or Wo Contrast  Result Date: 02/08/2018 CLINICAL DATA:  Severe abdominal pain and back pain EXAM: CT ANGIOGRAPHY CHEST, ABDOMEN AND PELVIS TECHNIQUE: Multidetector CT imaging through the chest, abdomen and pelvis was performed using the standard protocol during bolus administration of  intravenous contrast. Multiplanar reconstructed images and MIPs were obtained and reviewed to evaluate the vascular anatomy. CONTRAST:  154m ISOVUE-370 IOPAMIDOL (ISOVUE-370) INJECTION 76% COMPARISON:  08/21/2017.  05/10/2013. FINDINGS: CTA CHEST FINDINGS Cardiovascular: There is no evidence of acute intramural hematoma or aortic dissection. Maximal diameter of the ascending aorta is 3.4 cm. There is no obvious evidence of acute pulmonary thromboembolism. Great vessels are patent. Vertebral arteries are patent within the confines of the study. No significant coronary artery calcifications. Mediastinum/Nodes: No abnormal mediastinal adenopathy. No pericardial effusion. Visualized thyroid is unremarkable. Lungs/Pleura: No pneumothorax. No pleural effusion. Minimal scarring at the lung apices. Minimal dependent atelectasis in the lungs. Musculoskeletal: No vertebral compression deformity. No acute rib fracture. Review of the MIP images confirms the above findings. CTA ABDOMEN AND PELVIS FINDINGS VASCULAR Aorta: No evidence of aortic dissection or aneurysm. Aorta is patent. Mild atherosclerotic calcifications and minimal soft plaque in the lower abdominal aorta. 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 Branch vessels are patent. Accessory left hepatic artery anatomy. SMA: Patent. Renals: Single renal arteries are patent. IMA: Patent. Inflow: Bilateral common iliac, external iliac, and internal iliac arteries are patent. Review of  the MIP images confirms the above findings. NON-VASCULAR Hepatobiliary: Heterogeneous enhancement likely due to arterial phase imaging. No focal mass. Gallbladder is within normal limits. Pancreas: There is stranding and blurring of the fat planes in the head of the pancreas. There is heterogeneous enhancement in the head of the pancreas. These findings support acute pancreatitis. There is no evidence of pancreatic necrosis. There is stranding which extends from the  head of the pancreas inferiorly, surrounding the duodenum, and into the retroperitoneum. Hounsfield unit measurements are elevated. These findings are worrisome for retroperitoneal hemorrhage. Soft tissue mass would be a secondary consideration. Spleen: Post splenectomy. Adrenals/Urinary Tract: Tiny calculus in the lower pole of the right kidney. Left kidney is unremarkable. Stable left adrenal nodule. Unremarkable bladder. Stomach/Bowel: There is severe wall thickening and edema of the duodenum, associated with inflammation of the pancreatic head. As described above, high density stranding around the duodenum is noted worrisome for retroperitoneal hemorrhage. No evidence of small-bowel obstruction. Normal appendix. No obvious mass in the colon. Lymphatic: No abnormal retroperitoneal adenopathy. Reproductive: Uterus is absent.  Adnexa are within normal limits. Other: No free fluid. Musculoskeletal: No vertebral compression deformity. Review of the MIP images confirms the above findings. IMPRESSION: Vascular: No evidence of aortic dissection. Nonvascular: There is retroperitoneal hemorrhage associated with the head of the pancreas and duodenum as described above. These may represent a complication of acute pancreatitis. There is no evidence of pancreatic necrosis. Alternatively, the soft tissue density may represent neoplasm rather than hemorrhage. Electronically Signed   By: Marybelle Killings M.D.   On: 02/08/2018 16:06    Review of Systems  Constitutional: Negative for chills and fever.  HENT: Negative for hearing loss.   Eyes: Negative for blurred vision and double vision.  Respiratory: Negative for cough and hemoptysis.   Cardiovascular: Negative for chest pain and palpitations.  Gastrointestinal: Positive for abdominal pain and nausea. Negative for vomiting.  Genitourinary: Negative for dysuria and urgency.  Musculoskeletal: Positive for back pain. Negative for myalgias and neck pain.  Skin: Negative for  itching and rash.  Neurological: Negative for dizziness, tingling and headaches.  Endo/Heme/Allergies: Does not bruise/bleed easily.  Psychiatric/Behavioral: Negative for depression and suicidal ideas.   Blood pressure 121/71, pulse 70, temperature 99 F (37.2 C), temperature source Oral, resp. rate 20, height 5' 6" (1.676 m), weight 83.5 kg (184 lb), SpO2 95 %. Physical Exam  Vitals reviewed. Constitutional: She is oriented to person, place, and time. She appears well-developed and well-nourished.  HENT:  Head: Normocephalic and atraumatic.  Eyes: Pupils are equal, round, and reactive to light. Conjunctivae and EOM are normal.  Neck: Normal range of motion. Neck supple.  Cardiovascular: Normal rate and regular rhythm.  Respiratory: Effort normal and breath sounds normal.  GI: Soft. Bowel sounds are normal. She exhibits no distension. There is tenderness in the periumbilical area.  Musculoskeletal: Normal range of motion.  Neurological: She is alert and oriented to person, place, and time.  Skin: Skin is warm and dry.  Psychiatric: She has a normal mood and affect. Her behavior is normal.    Assessment/Plan: 62 yo female with abdominal pain with radiation to the back that has improved. Work up concerning for pancreatitis vs ulcer on CT scan. Lipase normal. History of smoking. Improvement with PPI. -I think this is most likely ulcer disease. -I would continue the PPI and continue to monitor -I recommend GI consult -We will continue to follow but as long as clinically she continues to improve doubt she  will require surgery  Arta Bruce Lalo Tromp 02/09/2018, 6:36 AM

## 2018-02-09 NOTE — ED Notes (Signed)
ED TO INPATIENT HANDOFF REPORT  Name/Age/Gender Jacqueline Haney 62 y.o. female  Code Status    Code Status Orders  (From admission, onward)        Start     Ordered   02/08/18 2003  Full code  Continuous     02/08/18 2002    Code Status History    Date Active Date Inactive Code Status Order ID Comments User Context   09/17/2014 1857 09/18/2014 1741 Full Code [id number]  Radene Gunning, NP Inpatient      Home/SNF/Other  Chief Complaint abd pain  Level of Care/Admitting Diagnosis ED Disposition    ED Disposition Condition [place]: [place] [100100]  Level of Care: Stepdown [14]  Diagnosis: Perforated viscus [382505]  Admitting Physician: Shirlean Mylar  Attending Physician: Rise Patience [phone]  Estimated length of stay: 3 - 4 days  Certification:: I certify this patient will need inpatient services for at least 2 midnights  Bed request comments: Please call the Surgery team and Hospitalist team on arrival  PT Class (Do Not Modify): Inpatient [101]  PT Acc Code (Do Not Modify): Private [1]       Medical History Past Medical History:  Diagnosis Date  . Chest pain    ACS rules out stress test negative  . Hyperlipidemia    refuses meds/diet controlled  . Migraine     Allergies Allergies  Allergen Reactions  . Aspirin Nausea And Vomiting  . Codeine Other (See Comments)    Alters mental status "climbs walls/feels high"  . Dilaudid [Hydromorphone Hcl]     Severe nausea and vomiting.  Marland Kitchen Hydrocodone Other (See Comments)    Alters mental status "climbs walls/feels high"  . Oxycodone-Acetaminophen Other (See Comments)    Alters mental status "climbing a while/high"  . Tape Dermatitis    IV Location/Drains/Wounds Patient Lines/Drains/Airways Status   Active Line/Drains/Airways    Name:   Placement date:   Placement time:   Site:   Days:   Peripheral IV 02/08/18 Left Antecubital   02/08/18    1435     Antecubital   1   Peripheral IV 02/08/18 Right Hand   02/08/18    2305    Hand   1          Labs/Imaging Results for orders placed or performed during the hospital encounter of 02/08/18 (from the past 48 hour(s))  Lipase, blood     Status: None   Collection Time: 02/08/18  2:08 PM  Result Value Ref Range   Lipase 22 11 - 51 U/L    Comment: Performed at [facility], [address] [place], [place] [address]  Comprehensive metabolic panel     Status: Abnormal   Collection Time: 02/08/18  2:08 PM  Result Value Ref Range   Sodium 140 135 - 145 mmol/L   Potassium 3.5 3.5 - 5.1 mmol/L   Chloride 110 98 - 111 mmol/L    Comment: Please note change in reference range.   CO2 20 (L) 22 - 32 mmol/L   Glucose, Bld 131 (H) 70 - 99 mg/dL    Comment: Please note change in reference range.   BUN 16 8 - 23 mg/dL    Comment: Please note change in reference range.   Creatinine, Ser 0.60 0.44 - 1.00 mg/dL   Calcium 9.2 8.9 - 10.3 mg/dL   Total Protein 7.1 6.5 - 8.1 g/dL   Albumin 3.8  3.5 - 5.0 g/dL   AST 23 15 - 41 U/L   ALT 24 0 - 44 U/L    Comment: Please note change in reference range.   Alkaline Phosphatase 70 38 - 126 U/L   Total Bilirubin 0.5 0.3 - 1.2 mg/dL   GFR calc non Af Amer >60 >60 mL/min   GFR calc Af Amer >60 >60 mL/min    Comment: (NOTE) The eGFR has been calculated using the CKD EPI equation. This calculation has not been validated in all clinical situations. eGFR's persistently <60 mL/min signify possible Chronic Kidney Disease.    Anion gap 10 5 - 15    Comment: Performed at [facility], [address], [place], [place] [address]  CBC     Status: Abnormal   Collection Time: 02/08/18  2:08 PM  Result Value Ref Range   WBC 15.4 (H) 4.0 - 10.5 K/uL   RBC 4.71 3.87 - 5.11 MIL/uL   Hemoglobin 13.9 12.0 - 15.0 g/dL   HCT 41.3 36.0 - 46.0 %   MCV 87.7 78.0 - 100.0 fL   MCH 29.5 26.0 - 34.0 pg   MCHC 33.7 30.0 - 36.0 g/dL   RDW 13.7 11.5 - 15.5 %   Platelets 199 150 -  400 K/uL    Comment: Performed at [facility], [address] [place], [place] [address]  Troponin I     Status: None   Collection Time: 02/08/18  2:31 PM  Result Value Ref Range   Troponin I <0.03 <0.03 ng/mL    Comment: Performed at [facility], [address], [place], [place] [address]  Type and screen [facility]     Status: None   Collection Time: 02/08/18  2:35 PM  Result Value Ref Range   ABO/RH(D) O POS    Antibody Screen NEG    Sample Expiration      02/11/2018 Performed at [facility], [address], [place], [place] [address]   Urinalysis, Routine w reflex microscopic     Status: Abnormal   Collection Time: 02/08/18  4:00 PM  Result Value Ref Range   Color, Urine YELLOW YELLOW   APPearance CLEAR CLEAR   Specific Gravity, Urine 1.031 (H) 1.005 - 1.030   pH 5.0 5.0 - 8.0   Glucose, UA NEGATIVE NEGATIVE mg/dL   Hgb urine dipstick SMALL (A) NEGATIVE   Bilirubin Urine NEGATIVE NEGATIVE   Ketones, ur NEGATIVE NEGATIVE mg/dL   Protein, ur 30 (A) NEGATIVE mg/dL   Nitrite NEGATIVE NEGATIVE   Leukocytes, UA NEGATIVE NEGATIVE   RBC / HPF 0-5 0 - 5 RBC/hpf   WBC, UA 0-5 0 - 5 WBC/hpf   Bacteria, UA NONE SEEN NONE SEEN   Squamous Epithelial / LPF 0-5 0 - 5   Mucus PRESENT     Comment: Performed at [facility], [address] [place], [place] [address]  Lactic acid, plasma     Status: None   Collection Time: 02/08/18  7:04 PM  Result Value Ref Range   Lactic Acid, Venous 1.1 0.5 - 1.9 mmol/L    Comment: Performed at [facility], [address] [place], [place] [address]  CBC with Differential     Status: Abnormal   Collection Time: 02/08/18  7:04 PM  Result Value Ref Range   WBC 19.6 (H) 4.0 - 10.5 K/uL   RBC 4.49 3.87 - 5.11 MIL/uL   Hemoglobin 13.1 12.0 - 15.0 g/dL   HCT 39.4 36.0 - 46.0 %  MCV 87.8 78.0 - 100.0 fL   MCH 29.2 26.0 - 34.0 pg   MCHC 33.2 30.0 - 36.0 g/dL   RDW 13.8 11.5 - 15.5 %   Platelets 297 150 - 400 K/uL   Neutrophils Relative % 80  %   Neutro Abs 15.5 (H) 1.7 - 7.7 K/uL   Lymphocytes Relative 16 %   Lymphs Abs 3.2 0.7 - 4.0 K/uL   Monocytes Relative 4 %   Monocytes Absolute 0.9 0.1 - 1.0 K/uL   Eosinophils Relative 0 %   Eosinophils Absolute 0.0 0.0 - 0.7 K/uL   Basophils Relative 0 %   Basophils Absolute 0.0 0.0 - 0.1 K/uL    Comment: Performed at [facility], [address] [place], [place] [address]  Magnesium     Status: None   Collection Time: 02/08/18  7:04 PM  Result Value Ref Range   Magnesium 1.7 1.7 - 2.4 mg/dL    Comment: Performed at [facility], [address], [place], [place] [address]  Phosphorus     Status: None   Collection Time: 02/08/18  7:04 PM  Result Value Ref Range   Phosphorus 4.5 2.5 - 4.6 mg/dL    Comment: Performed at [facility], [address] [place], [place] [address]  Protime-INR     Status: None   Collection Time: 02/08/18  7:04 PM  Result Value Ref Range   Prothrombin Time 13.1 11.4 - 15.2 seconds   INR 1.00     Comment: Performed at [facility], [address], [place], [place] [address]  CBG monitoring, ED     Status: Abnormal   Collection Time: 02/08/18  9:45 PM  Result Value Ref Range   Glucose-Capillary 142 (H) 70 - 99 mg/dL   Dg Abdomen Acute W/chest  Result Date: 02/08/2018 CLINICAL DATA:  Acute onset abdominal and back pain. Evaluate for pneumoperitoneum. EXAM: DG ABDOMEN ACUTE W/ 1V CHEST COMPARISON:  CT abdomen pelvis dated August 21, 2017. Chest x-ray dated October 08, 2016. FINDINGS: There is no evidence of dilated bowel loops or free intraperitoneal air. No radiopaque calculi or other significant radiographic abnormality is seen. Heart size and mediastinal contours are within normal limits. Both lungs are clear. No acute osseous abnormality. IMPRESSION: Negative abdominal radiographs.  No acute cardiopulmonary disease. Electronically Signed   By: Titus Dubin M.D.   On: 02/08/2018 15:28   Ct Angio Chest/abd/pel For Dissection W And/or Wo  Contrast  Result Date: 02/08/2018 CLINICAL DATA:  Severe abdominal pain and back pain EXAM: CT ANGIOGRAPHY CHEST, ABDOMEN AND PELVIS TECHNIQUE: Multidetector CT imaging through the chest, abdomen and pelvis was performed using the standard protocol during bolus administration of intravenous contrast. Multiplanar reconstructed images and MIPs were obtained and reviewed to evaluate the vascular anatomy. CONTRAST:  178m ISOVUE-370 IOPAMIDOL (ISOVUE-370) INJECTION 76% COMPARISON:  08/21/2017.  05/10/2013. FINDINGS: CTA CHEST FINDINGS Cardiovascular: There is no evidence of acute intramural hematoma or aortic dissection. Maximal diameter of the ascending aorta is 3.4 cm. There is no obvious evidence of acute pulmonary thromboembolism. Great vessels are patent. Vertebral arteries are patent within the confines of the study. No significant coronary artery calcifications. Mediastinum/Nodes: No abnormal mediastinal adenopathy. No pericardial effusion. Visualized thyroid is unremarkable. Lungs/Pleura: No pneumothorax. No pleural effusion. Minimal scarring at the lung apices. Minimal dependent atelectasis in the lungs. Musculoskeletal: No vertebral compression deformity. No acute rib fracture. Review of the MIP images confirms the above findings. CTA ABDOMEN AND PELVIS FINDINGS VASCULAR Aorta: No evidence of  aortic dissection or aneurysm. Aorta is patent. Mild atherosclerotic calcifications and minimal soft plaque in the lower abdominal aorta. 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 Branch vessels are patent. Accessory left hepatic artery anatomy. SMA: Patent. Renals: Single renal arteries are patent. IMA: Patent. Inflow: Bilateral common iliac, external iliac, and internal iliac arteries are patent. Review of the MIP images confirms the above findings. NON-VASCULAR Hepatobiliary: Heterogeneous enhancement likely due to arterial phase imaging. No focal mass. Gallbladder is within normal limits.  Pancreas: There is stranding and blurring of the fat planes in the head of the pancreas. There is heterogeneous enhancement in the head of the pancreas. These findings support acute pancreatitis. There is no evidence of pancreatic necrosis. There is stranding which extends from the head of the pancreas inferiorly, surrounding the duodenum, and into the retroperitoneum. Hounsfield unit measurements are elevated. These findings are worrisome for retroperitoneal hemorrhage. Soft tissue mass would be a secondary consideration. Spleen: Post splenectomy. Adrenals/Urinary Tract: Tiny calculus in the lower pole of the right kidney. Left kidney is unremarkable. Stable left adrenal nodule. Unremarkable bladder. Stomach/Bowel: There is severe wall thickening and edema of the duodenum, associated with inflammation of the pancreatic head. As described above, high density stranding around the duodenum is noted worrisome for retroperitoneal hemorrhage. No evidence of small-bowel obstruction. Normal appendix. No obvious mass in the colon. Lymphatic: No abnormal retroperitoneal adenopathy. Reproductive: Uterus is absent.  Adnexa are within normal limits. Other: No free fluid. Musculoskeletal: No vertebral compression deformity. Review of the MIP images confirms the above findings. IMPRESSION: Vascular: No evidence of aortic dissection. Nonvascular: There is retroperitoneal hemorrhage associated with the head of the pancreas and duodenum as described above. These may represent a complication of acute pancreatitis. There is no evidence of pancreatic necrosis. Alternatively, the soft tissue density may represent neoplasm rather than hemorrhage. Electronically Signed   By: Marybelle Killings M.D.   On: 02/08/2018 16:06    Pending Labs Unresulted Labs (From admission, onward)   Start     Ordered   02/09/18 2426  Basic metabolic panel  Tomorrow morning,   R     02/08/18 2002   02/08/18 2003  CBC with Differential/Platelet  Now then  every 8 hours,   R     02/08/18 2002   02/08/18 2003  HIV antibody (Routine Testing)  Once,   R     02/08/18 2002      Vitals/Pain Today's Vitals   02/08/18 2200 02/08/18 2230 02/08/18 2345 02/09/18 0013  BP: 118/75 116/83 120/71 (!) 98/58  Pulse: 71 70 77 67  Resp: 14 17 (!) 22 16  Temp:   98.4 F (36.9 C)   TempSrc:   Oral Oral  SpO2: 92% 96% 95% 92%  Weight:      Height:      PainSc:    Asleep    Isolation Precautions No active isolations  Medications Medications  pantoprazole (PROTONIX) 80 mg in sodium chloride 0.9 % 250 mL (0.32 mg/mL) infusion (8 mg/hr Intravenous New Bag/Given 02/08/18 1710)  sodium bicarbonate 150 mEq in dextrose 5 % 1,000 mL infusion ( Intravenous New Bag/Given 02/08/18 2115)  sodium chloride 0.9 % bolus 1,000 mL (0 mLs Intravenous Stopped 02/08/18 1532)  morphine 4 MG/ML injection 4 mg (4 mg Intravenous Given 02/08/18 1442)  ondansetron (ZOFRAN) injection 4 mg (4 mg Intravenous Given 02/08/18 1442)  iopamidol (ISOVUE-370) 76 % injection 100 mL (100 mLs Intravenous Contrast Given 02/08/18 1520)  piperacillin-tazobactam (ZOSYN) IVPB 3.375  g (0 g Intravenous Stopped 02/08/18 1655)  pantoprazole (PROTONIX) injection 80 mg (80 mg Intravenous Given 02/08/18 1649)  HYDROmorphone (DILAUDID) injection 0.5 mg (0.5 mg Intravenous Given 02/08/18 1710)    Mobility ambulatory

## 2018-02-09 NOTE — Progress Notes (Signed)
PROGRESS NOTE  SHERICE IJAMES  UMP:[id number] DOB: 1956-05-19 DOA: 02/08/2018 PCP: Jani Gravel, MD   Brief Narrative: CHITARA CLONCH is a 62 y.o. female with a history of daily headaches and ibuprofen use who presented to [facility] ED for abrupt onset of severe abdominal pain radiating to the back while at her Nephew's funeral. Her exam was consistent with peritonitis. ED evaluation included CT angio chest/abdomen/pelvis which demonstrated heterogenous enhancement of the head of the pancreas, severe wall thickening along the duodenum and features consistent with retroperitoneal bleeding. Lipase was normal. There was leukocytosis but reassuringly normal lactic acid. Surgery was consulted and the patient was kept NPO, transferred to [facility] for unclear reasons.   Assessment & Plan: Active Problems:   Retroperitoneal bleed   Perforated viscus  Abdominal pain with suspected duodenal ulcer: No free air on XR.   - PPI gtt - Analgesics as tolerated - Diet and EGD timing per GI - General surgery following. - Stop NSAIDs.  - With leukocytosis, covering with zosyn.  Retroperitoneal bleeding:  - Monitor vital signs closely in SDU.  - CBC q12h   Tobacco use:  - Cessation counseling provided. She was not receptive.   Chronic daily headache: No nausea, vision changes, aura, and is bilateral. Not consistent with typical migraine features. 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  - Stop NSAIDs as above - No absolute contraindication to triptan so will reorder prn  DVT prophylaxis: SCDs Code Status: Full Family Communication: At bedside Disposition Plan: Uncertain  Consultants:   GI  General surgery  Procedures:   None  Antimicrobials:  Zosyn 6/26 >>    Subjective: Irritable about not getting headache medications. Wants coffee. Has mild abdominal pain at rest but feels "worse than giving birth" when abdomen is touched.   Objective: Vitals:   02/09/18 0242 02/09/18 0335 02/09/18  0738 02/09/18 1200  BP: 121/71  113/72 (!) 111/51  Pulse:   61 66  Resp:   13 17  Temp: 97.8 F (36.6 C) 99 F (37.2 C) 98.1 F (36.7 C) 98.1 F (36.7 C)  TempSrc: Oral Oral Oral Oral  SpO2:   96% 96%  Weight:      Height:        Intake/Output Summary (Last 24 hours) at 02/09/2018 1508 Last data filed at 02/09/2018 0800 Gross per 24 hour  Intake 0 ml  Output -  Net 0 ml   Filed Weights   02/08/18 1354  Weight: 83.5 kg (184 lb)    Gen: 62 y.o. female in no distress Pulm: Non-labored breathing. Clear to auscultation bilaterally.  CV: Regular rate and rhythm. No murmur, rub, or gallop. No JVD, no pedal edema. GI: Abdomen is diffusely significantly tender worse in epigastrium with guarding, no distention. + bowel sounds. No organomegaly or masses felt. Ext: Warm, no deformities Skin: No rashes, lesions or ulcers Neuro: Alert and oriented. No focal neurological deficits. Psych: Judgement and insight appear normal. Mood & affect appropriate.   Data Reviewed: I have personally reviewed following labs and imaging studies  CBC: Recent Labs  Lab 02/08/18 1408 02/08/18 1904  WBC 15.4* 19.6*  NEUTROABS  --  15.5*  HGB 13.9 13.1  HCT 41.3 39.4  MCV 87.7 87.8  PLT 199 400   Basic Metabolic Panel: Recent Labs  Lab 02/08/18 1408 02/08/18 1904  NA 140  --   K 3.5  --   CL 110  --   CO2 20*  --   GLUCOSE 131*  --  BUN 16  --   CREATININE 0.60  --   CALCIUM 9.2  --   MG  --  1.7  PHOS  --  4.5   GFR: Estimated Creatinine Clearance: 79.4 mL/min (by C-G formula based on SCr of 0.6 mg/dL). Liver Function Tests: Recent Labs  Lab 02/08/18 1408  AST 23  ALT 24  ALKPHOS 70  BILITOT 0.5  PROT 7.1  ALBUMIN 3.8   Recent Labs  Lab 02/08/18 1408  LIPASE 22   No results for input(s): AMMONIA in the last 168 hours. Coagulation Profile: Recent Labs  Lab 02/08/18 1904  INR 1.00   Cardiac Enzymes: Recent Labs  Lab 02/08/18 1431  TROPONINI <0.03   BNP (last  3 results) No results for input(s): PROBNP in the last 8760 hours. HbA1C: No results for input(s): HGBA1C in the last 72 hours. CBG: Recent Labs  Lab 02/08/18 2145 02/09/18 0128  GLUCAP 142* 134*   Lipid Profile: No results for input(s): CHOL, HDL, LDLCALC, TRIG, CHOLHDL, LDLDIRECT in the last 72 hours. Thyroid Function Tests: No results for input(s): TSH, T4TOTAL, FREET4, T3FREE, THYROIDAB in the last 72 hours. Anemia Panel: No results for input(s): VITAMINB12, FOLATE, FERRITIN, TIBC, IRON, RETICCTPCT in the last 72 hours. Urine analysis:    Component Value Date/Time   COLORURINE YELLOW 02/08/2018 1600   APPEARANCEUR CLEAR 02/08/2018 1600   LABSPEC 1.031 (H) 02/08/2018 1600   PHURINE 5.0 02/08/2018 1600   GLUCOSEU NEGATIVE 02/08/2018 1600   HGBUR SMALL (A) 02/08/2018 1600   BILIRUBINUR NEGATIVE 02/08/2018 1600   KETONESUR NEGATIVE 02/08/2018 1600   PROTEINUR 30 (A) 02/08/2018 1600   UROBILINOGEN 0.2 05/16/2012 0213   NITRITE NEGATIVE 02/08/2018 1600   LEUKOCYTESUR NEGATIVE 02/08/2018 1600   Recent Results (from the past 240 hour(s))  Surgical pcr screen     Status: None   Collection Time: 02/09/18  2:52 AM  Result Value Ref Range Status   MRSA, PCR NEGATIVE NEGATIVE Final   Staphylococcus aureus NEGATIVE NEGATIVE Final    Comment: (NOTE) The Xpert SA Assay (FDA approved for NASAL specimens in patients 41 years of age and older), is one component of a comprehensive surveillance program. It is not intended to diagnose infection nor to guide or monitor treatment. Performed at [facility], [place] [address], [place], [place] [address]       Radiology Studies: Dg Abdomen Acute W/chest  Result Date: 02/08/2018 CLINICAL DATA:  Acute onset abdominal and back pain. Evaluate for pneumoperitoneum. EXAM: DG ABDOMEN ACUTE W/ 1V CHEST COMPARISON:  CT abdomen pelvis dated August 21, 2017. Chest x-ray dated October 08, 2016. FINDINGS: There is no evidence of dilated bowel  loops or free intraperitoneal air. No radiopaque calculi or other significant radiographic abnormality is seen. Heart size and mediastinal contours are within normal limits. Both lungs are clear. No acute osseous abnormality. IMPRESSION: Negative abdominal radiographs.  No acute cardiopulmonary disease. Electronically Signed   By: Titus Dubin M.D.   On: 02/08/2018 15:28   Ct Angio Chest/abd/pel For Dissection W And/or Wo Contrast  Result Date: 02/08/2018 CLINICAL DATA:  Severe abdominal pain and back pain EXAM: CT ANGIOGRAPHY CHEST, ABDOMEN AND PELVIS TECHNIQUE: Multidetector CT imaging through the chest, abdomen and pelvis was performed using the standard protocol during bolus administration of intravenous contrast. Multiplanar reconstructed images and MIPs were obtained and reviewed to evaluate the vascular anatomy. CONTRAST:  153mL ISOVUE-370 IOPAMIDOL (ISOVUE-370) INJECTION 76% COMPARISON:  08/21/2017.  05/10/2013. FINDINGS: CTA CHEST FINDINGS Cardiovascular: There  is no evidence of acute intramural hematoma or aortic dissection. Maximal diameter of the ascending aorta is 3.4 cm. There is no obvious evidence of acute pulmonary thromboembolism. Great vessels are patent. Vertebral arteries are patent within the confines of the study. No significant coronary artery calcifications. Mediastinum/Nodes: No abnormal mediastinal adenopathy. No pericardial effusion. Visualized thyroid is unremarkable. Lungs/Pleura: No pneumothorax. No pleural effusion. Minimal scarring at the lung apices. Minimal dependent atelectasis in the lungs. Musculoskeletal: No vertebral compression deformity. No acute rib fracture. Review of the MIP images confirms the above findings. CTA ABDOMEN AND PELVIS FINDINGS VASCULAR Aorta: No evidence of aortic dissection or aneurysm. Aorta is patent. Mild atherosclerotic calcifications and minimal soft plaque in the lower abdominal aorta. Celiac: Narrowing secondary to median arcuate ligament  syndrome. Branch vessels are patent. Accessory left hepatic artery anatomy. SMA: Patent. Renals: Single renal arteries are patent. IMA: Patent. Inflow: Bilateral common iliac, external iliac, and internal iliac arteries are patent. Review of the MIP images confirms the above findings. NON-VASCULAR Hepatobiliary: Heterogeneous enhancement likely due to arterial phase imaging. No focal mass. Gallbladder is within normal limits. Pancreas: There is stranding and blurring of the fat planes in the head of the pancreas. There is heterogeneous enhancement in the head of the pancreas. These findings support acute pancreatitis. There is no evidence of pancreatic necrosis. There is stranding which extends from the head of the pancreas inferiorly, surrounding the duodenum, and into the retroperitoneum. Hounsfield unit measurements are elevated. These findings are worrisome for retroperitoneal hemorrhage. Soft tissue mass would be a secondary consideration. Spleen: Post splenectomy. Adrenals/Urinary Tract: Tiny calculus in the lower pole of the right kidney. Left kidney is unremarkable. Stable left adrenal nodule. Unremarkable bladder. Stomach/Bowel: There is severe wall thickening and edema of the duodenum, associated with inflammation of the pancreatic head. As described above, high density stranding around the duodenum is noted worrisome for retroperitoneal hemorrhage. No evidence of small-bowel obstruction. Normal appendix. No obvious mass in the colon. Lymphatic: No abnormal retroperitoneal adenopathy. Reproductive: Uterus is absent.  Adnexa are within normal limits. Other: No free fluid. Musculoskeletal: No vertebral compression deformity. Review of the MIP images confirms the above findings. IMPRESSION: Vascular: No evidence of aortic dissection. Nonvascular: There is retroperitoneal hemorrhage associated with the head of the pancreas and duodenum as described above. These may represent a complication of acute  pancreatitis. There is no evidence of pancreatic necrosis. Alternatively, the soft tissue density may represent neoplasm rather than hemorrhage. Electronically Signed   By: Marybelle Killings M.D.   On: 02/08/2018 16:06    Scheduled Meds: . diphenhydrAMINE  25 mg Intravenous Once   Continuous Infusions: . pantoprozole (PROTONIX) infusion 8 mg/hr (02/09/18 1433)  . piperacillin-tazobactam (ZOSYN)  IV 3.375 g (02/09/18 1424)  .  sodium bicarbonate  infusion 1000 mL 100 mL/hr at 02/08/18 2115     LOS: 1 day   Time spent: 25 minutes.  Patrecia Pour, MD [facility] [email or web address] Password [email or web address] 02/09/2018, 3:08 PM

## 2018-02-09 NOTE — Consult Note (Addendum)
Consultation  Referring Provider:  Dr. Bonner Puna    Primary Care Physician:  Jani Gravel, MD Primary Gastroenterologist:  Althia Forts       Reason for Consultation: Abdominal pain         Impression / Plan:   Impression: 1.  Epigastric Abdominal pain: radiates into back, thought related to perforated viscus suspected due to posterior perforation of the duodenum from likely ulcer, H/o ibuprofen usage 10 tabs per day for migraine headaches for "years"; most likely duodenal ulcer 2. Abnormal Ct abdomen: likely related to above, though question of pancreatitis vs hemorrhage vs mass-normal lipase 22 on 02/08/18  Plan: 1.  Continue Protonix drip 2.  Continue pain medications 3.  Will place patient on clear liquid diet for now 4.   EGD scheduled for tomorrow. Did discuss risks, benefits, limitations and alternatives and the patient agrees to proceed. 5. Please await further recommendations from Dr. Carlean Purl later today  Thank you for your kind consultation, we will continue to follow.  Jacqueline Haney  02/09/2018, 12:04 PM Pager #: [phone]    [facility] Attending   I have taken an interval history, reviewed the chart and examined the patient. I agree with the Advanced Practitioner's note, impression and recommendations.   I ordered some Dilaudid - she has bad pain and in my experience a duodenal ulcer in this area can be quite painful. I removed it from allergy/SE list as she told me she is not allergic or did not have problems she told staff she had problems due to "not wanting to be hooked on it"        Jacqueline Mayer, MD, [facility] 02/09/2018 7:21 PM   HPI:   Jacqueline Haney is a 62 y.o. female with a past medical history as listed below, who presented to the ER 02/08/2018 for severe abdominal and back pain.    Today, explains she had been using a significant amount of ibuprofen, at least 10 tablets of ibuprofen a day for years for migraines and  developed severe epigastric pain yesterday with associated severe back pain this developed at her nephew's funeral.  Pain was a 10/10 in the ER, no pain today as long as she lays still, still 9/10 if touched.  Vomited one time during transport from [facility] and again this morning.  Last normal bowel movement 02/07/2018.    Denies heartburn, reflux, bright red blood or black tarry stools.  ED course: CT scan of abdomen chest with contrast revealed retroperitoneal bleed, heterogenous enhancement of the head of the pancreas, severe wall thickening and edema of the duodenum; x-ray with no free air, lactic acid 1.1; seen by surgical team who recommended GI consult  Past Medical History:  Diagnosis Date  . Chest pain    ACS rules out stress test negative  . Hyperlipidemia    refuses meds/diet controlled  . Migraine     Past Surgical History:  Procedure Laterality Date  . ABDOMINAL HYSTERECTOMY    . SPLENECTOMY      Family History  Problem Relation Age of Onset  . Cancer Mother   . Cancer Father   . Kidney failure Sister   . Heart attack Sister   . Cancer Brother   . Cancer Other      Social History   Tobacco Use  . Smoking status: Current Every Day Smoker    Packs/day: 0.50    Types: Cigarettes  . Smokeless tobacco: Never Used  Substance  Use Topics  . Alcohol use: No  . Drug use: No    Prior to Admission medications   Medication Sig Start Date End Date Taking? Authorizing Provider  ibuprofen (ADVIL,MOTRIN) 200 MG tablet Take 800 mg by mouth every 6 (six) hours as needed.   Yes [provider]  SUMAtriptan (IMITREX) 50 MG tablet Take 50 mg by mouth every 2 (two) hours as needed for migraine or headache.  09/16/14  Yes [provider]    Current Facility-Administered Medications  Medication Dose Route Frequency Provider Last Rate Last Dose  . diphenhydrAMINE (BENADRYL) injection 25 mg  25 mg Intravenous Once Schorr, Rhetta Mura, NP      . pantoprazole  (PROTONIX) 80 mg in sodium chloride 0.9 % 250 mL (0.32 mg/mL) infusion  8 mg/hr Intravenous Continuous Dana Allan I, MD 25 mL/hr at 02/08/18 1710 8 mg/hr at 02/08/18 1710  . piperacillin-tazobactam (ZOSYN) IVPB 3.375 g  3.375 g Intravenous Q8H Dana Allan I, MD 12.5 mL/hr at 02/09/18 0556 3.375 g at 02/09/18 0556  . sodium bicarbonate 150 mEq in dextrose 5 % 1,000 mL infusion   Intravenous Continuous Dana Allan I, MD 100 mL/hr at 02/08/18 2115    . SUMAtriptan (IMITREX) tablet 50 mg  50 mg Oral Q2H PRN Patrecia Pour, MD   50 mg at 02/09/18 0914    Allergies as of 02/08/2018 - Review Complete 02/08/2018  Allergen Reaction Noted  . Aspirin Nausea And Vomiting   . Codeine Other (See Comments)   . Dilaudid [hydromorphone hcl]  06/22/2015  . Hydrocodone Other (See Comments)   . Oxycodone-acetaminophen Other (See Comments)   . Tape Dermatitis 05/16/2012     Review of Systems:    Constitutional: No weight loss, fever or chills Skin: No rash  Cardiovascular: No chest pain Respiratory: No SOB Gastrointestinal: See HPI and otherwise negative Genitourinary: No dysuria Neurological: +chronic migraines Musculoskeletal: No new muscle or joint pain Hematologic: No bruising Psychiatric: No history of depression or anxiety   Physical Exam:  Vital signs in last 24 hours: Temp:  [97.8 F (36.6 C)-99 F (37.2 C)] 98.1 F (36.7 C) (06/27 0738) Pulse Rate:  [61-79] 61 (06/27 0738) Resp:  [13-26] 13 (06/27 0738) BP: (91-130)/(58-90) 113/72 (06/27 0738) SpO2:  [88 %-98 %] 96 % (06/27 0738) Weight:  [184 lb (83.5 kg)] 184 lb (83.5 kg) (06/26 1354) Last BM Date: (PTA) General:   Pleasant AA female appears to be in NAD, Well developed, Well nourished, alert and cooperative Head:  Normocephalic and atraumatic. Eyes:   PEERL, EOMI. No icterus. Conjunctiva pink. Ears:  Normal auditory acuity. Neck:  Supple Throat: Oral cavity and pharynx without inflammation, swelling or lesion.    Lungs: Respirations even and unlabored. Lungs clear to auscultation bilaterally.   No wheezes, crackles, or rhonchi.  Heart: Normal S1, S2. No MRG. Regular rate and rhythm. No peripheral edema, cyanosis or pallor.  Abdomen:  Soft, nondistended, marked epigastric ttp with involuntary guarding, Normal bowel sounds. No appreciable masses or hepatomegaly. Rectal:  Not performed.  Msk:  Symmetrical without gross deformities. Peripheral pulses intact.  Extremities:  Without edema, no deformity or joint abnormality.  Neurologic:  Alert and  oriented x4;  grossly normal neurologically.   Skin:   Dry and intact without significant lesions or rashes. Psychiatric: Demonstrates good judgement and reason without abnormal affect or behaviors.  LAB RESULTS: Recent Labs    02/08/18 1408 02/08/18 1904  WBC 15.4* 19.6*  HGB 13.9 13.1  HCT  41.3 39.4  PLT 199 297   BMET Recent Labs    02/08/18 1408  NA 140  K 3.5  CL 110  CO2 20*  GLUCOSE 131*  BUN 16  CREATININE 0.60  CALCIUM 9.2   LFT Recent Labs    02/08/18 1408  PROT 7.1  ALBUMIN 3.8  AST 23  ALT 24  ALKPHOS 70  BILITOT 0.5   PT/INR Recent Labs    02/08/18 1904  LABPROT 13.1  INR 1.00    STUDIES: Dg Abdomen Acute W/chest  Result Date: 02/08/2018 CLINICAL DATA:  Acute onset abdominal and back pain. Evaluate for pneumoperitoneum. EXAM: DG ABDOMEN ACUTE W/ 1V CHEST COMPARISON:  CT abdomen pelvis dated August 21, 2017. Chest x-ray dated October 08, 2016. FINDINGS: There is no evidence of dilated bowel loops or free intraperitoneal air. No radiopaque calculi or other significant radiographic abnormality is seen. Heart size and mediastinal contours are within normal limits. Both lungs are clear. No acute osseous abnormality. IMPRESSION: Negative abdominal radiographs.  No acute cardiopulmonary disease. Electronically Signed   By: Titus Dubin M.D.   On: 02/08/2018 15:28   Ct Angio Chest/abd/pel For Dissection W And/or Wo  Contrast  Result Date: 02/08/2018 CLINICAL DATA:  Severe abdominal pain and back pain EXAM: CT ANGIOGRAPHY CHEST, ABDOMEN AND PELVIS TECHNIQUE: Multidetector CT imaging through the chest, abdomen and pelvis was performed using the standard protocol during bolus administration of intravenous contrast. Multiplanar reconstructed images and MIPs were obtained and reviewed to evaluate the vascular anatomy. CONTRAST:  172mL ISOVUE-370 IOPAMIDOL (ISOVUE-370) INJECTION 76% COMPARISON:  08/21/2017.  05/10/2013. FINDINGS: CTA CHEST FINDINGS Cardiovascular: There is no evidence of acute intramural hematoma or aortic dissection. Maximal diameter of the ascending aorta is 3.4 cm. There is no obvious evidence of acute pulmonary thromboembolism. Great vessels are patent. Vertebral arteries are patent within the confines of the study. No significant coronary artery calcifications. Mediastinum/Nodes: No abnormal mediastinal adenopathy. No pericardial effusion. Visualized thyroid is unremarkable. Lungs/Pleura: No pneumothorax. No pleural effusion. Minimal scarring at the lung apices. Minimal dependent atelectasis in the lungs. Musculoskeletal: No vertebral compression deformity. No acute rib fracture. Review of the MIP images confirms the above findings. CTA ABDOMEN AND PELVIS FINDINGS VASCULAR Aorta: No evidence of aortic dissection or aneurysm. Aorta is patent. Mild atherosclerotic calcifications and minimal soft plaque in the lower abdominal aorta. 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 Branch vessels are patent. Accessory left hepatic artery anatomy. SMA: Patent. Renals: Single renal arteries are patent. IMA: Patent. Inflow: Bilateral common iliac, external iliac, and internal iliac arteries are patent. Review of the MIP images confirms the above findings. NON-VASCULAR Hepatobiliary: Heterogeneous enhancement likely due to arterial phase imaging. No focal mass. Gallbladder is within normal limits.  Pancreas: There is stranding and blurring of the fat planes in the head of the pancreas. There is heterogeneous enhancement in the head of the pancreas. These findings support acute pancreatitis. There is no evidence of pancreatic necrosis. There is stranding which extends from the head of the pancreas inferiorly, surrounding the duodenum, and into the retroperitoneum. Hounsfield unit measurements are elevated. These findings are worrisome for retroperitoneal hemorrhage. Soft tissue mass would be a secondary consideration. Spleen: Post splenectomy. Adrenals/Urinary Tract: Tiny calculus in the lower pole of the right kidney. Left kidney is unremarkable. Stable left adrenal nodule. Unremarkable bladder. Stomach/Bowel: There is severe wall thickening and edema of the duodenum, associated with inflammation of the pancreatic head. As described above, high density stranding around the duodenum  is noted worrisome for retroperitoneal hemorrhage. No evidence of small-bowel obstruction. Normal appendix. No obvious mass in the colon. Lymphatic: No abnormal retroperitoneal adenopathy. Reproductive: Uterus is absent.  Adnexa are within normal limits. Other: No free fluid. Musculoskeletal: No vertebral compression deformity. Review of the MIP images confirms the above findings. IMPRESSION: Vascular: No evidence of aortic dissection. Nonvascular: There is retroperitoneal hemorrhage associated with the head of the pancreas and duodenum as described above. These may represent a complication of acute pancreatitis. There is no evidence of pancreatic necrosis. Alternatively, the soft tissue density may represent neoplasm rather than hemorrhage. Electronically Signed   By: Marybelle Killings M.D.   On: 02/08/2018 16:06

## 2018-02-09 NOTE — ED Notes (Signed)
[facility] advised that it would be two hours before they could transport pt, [facility] contacted for transport and advised that they would check with Medic [facility] and return call to er,

## 2018-02-09 NOTE — ED Notes (Signed)
[facility] here to transport pt,  

## 2018-02-10 ENCOUNTER — Encounter (HOSPITAL_COMMUNITY): Payer: Self-pay | Admitting: *Deleted

## 2018-02-10 ENCOUNTER — Encounter (HOSPITAL_COMMUNITY)
Admission: EM | Disposition: A | Discharge status: Home / Self Care | Payer: Self-pay | Source: Home / Self Care | Attending: Family Medicine

## 2018-02-10 ENCOUNTER — Inpatient Hospital Stay (HOSPITAL_COMMUNITY): Payer: BC Managed Care – PPO | Admitting: Anesthesiology

## 2018-02-10 DIAGNOSIS — K2980 Duodenitis without bleeding: Secondary | ICD-10-CM | POA: Diagnosis present

## 2018-02-10 DIAGNOSIS — I774 Celiac artery compression syndrome: Secondary | ICD-10-CM

## 2018-02-10 DIAGNOSIS — K3189 Other diseases of stomach and duodenum: Secondary | ICD-10-CM

## 2018-02-10 DIAGNOSIS — F1990 Other psychoactive substance use, unspecified, uncomplicated: Secondary | ICD-10-CM

## 2018-02-10 HISTORY — PX: BIOPSY: SHX5522

## 2018-02-10 HISTORY — PX: ESOPHAGOGASTRODUODENOSCOPY (EGD) WITH PROPOFOL: SHX5813

## 2018-02-10 LAB — CBC
HEMATOCRIT: 35.2 % — AB (ref 36.0–46.0)
Hemoglobin: 11.6 g/dL — ABNORMAL LOW (ref 12.0–15.0)
MCH: 29.1 pg (ref 26.0–34.0)
MCHC: 33 g/dL (ref 30.0–36.0)
MCV: 88.2 fL (ref 78.0–100.0)
PLATELETS: 290 10*3/uL (ref 150–400)
RBC: 3.99 MIL/uL (ref 3.87–5.11)
RDW: 13.9 % (ref 11.5–15.5)
WBC: 14.5 10*3/uL — ABNORMAL HIGH (ref 4.0–10.5)

## 2018-02-10 LAB — HIV ANTIBODY (ROUTINE TESTING W REFLEX): HIV Screen 4th Generation wRfx: NONREACTIVE

## 2018-02-10 SURGERY — ESOPHAGOGASTRODUODENOSCOPY (EGD) WITH PROPOFOL
Anesthesia: Monitor Anesthesia Care

## 2018-02-10 MED ORDER — PANTOPRAZOLE SODIUM 40 MG PO TBEC
40.0000 mg | DELAYED_RELEASE_TABLET | Freq: Every day | ORAL | Status: DC
  Administered 2018-02-10 – 2018-02-12 (×3): 40 mg via ORAL
  Filled 2018-02-10 (×3): qty 1

## 2018-02-10 MED ORDER — PROPOFOL 10 MG/ML IV BOLUS
INTRAVENOUS | Status: DC | PRN
  Administered 2018-02-10: 10 mg via INTRAVENOUS
  Administered 2018-02-10: 20 mg via INTRAVENOUS

## 2018-02-10 MED ORDER — FENTANYL CITRATE (PF) 100 MCG/2ML IJ SOLN
INTRAMUSCULAR | Status: DC | PRN
  Administered 2018-02-10: 50 ug via INTRAVENOUS
  Administered 2018-02-10 (×2): 25 ug via INTRAVENOUS

## 2018-02-10 MED ORDER — LIDOCAINE 2% (20 MG/ML) 5 ML SYRINGE
INTRAMUSCULAR | Status: DC | PRN
  Administered 2018-02-10: 50 mg via INTRAVENOUS

## 2018-02-10 MED ORDER — PROPOFOL 500 MG/50ML IV EMUL
INTRAVENOUS | Status: DC | PRN
  Administered 2018-02-10: 150 ug/kg/min via INTRAVENOUS

## 2018-02-10 MED FILL — Ondansetron HCl Inj 4 MG/2ML (2 MG/ML): INTRAMUSCULAR | Qty: 2 | Status: AC

## 2018-02-10 SURGICAL SUPPLY — 15 items

## 2018-02-10 NOTE — Anesthesia Postprocedure Evaluation (Signed)
Anesthesia Post Note  Patient: Jacqueline Haney  Procedure(s) Performed: ESOPHAGOGASTRODUODENOSCOPY (EGD) WITH PROPOFOL (N/A )     Patient location during evaluation: Endoscopy Anesthesia Type: MAC Level of consciousness: awake and alert Pain management: pain level controlled Vital Signs Assessment: post-procedure vital signs reviewed and stable Respiratory status: spontaneous breathing, nonlabored ventilation, respiratory function stable and patient connected to nasal cannula oxygen Cardiovascular status: stable and blood pressure returned to baseline Postop Assessment: no apparent nausea or vomiting Anesthetic complications: no    Last Vitals:  Vitals:   02/10/18 0919 02/10/18 1138  BP: 113/72 119/62  Pulse:  74  Resp:  18  Temp:  36.7 C  SpO2:  96%    Last Pain:  Vitals:   02/10/18 1138  TempSrc: Oral  PainSc:                  Barnet [place]

## 2018-02-10 NOTE — Anesthesia Preprocedure Evaluation (Addendum)
Anesthesia Evaluation  Patient identified by MRN, date of birth, ID band Patient awake    Reviewed: Allergy & Precautions, NPO status , Patient's Chart, lab work & pertinent test results  Airway Mallampati: II       Dental  (+) Teeth Intact, Caps   Pulmonary Current Smoker,    breath sounds clear to auscultation       Cardiovascular negative cardio ROS Normal cardiovascular exam     Neuro/Psych negative neurological ROS  negative psych ROS   GI/Hepatic negative GI ROS,   Endo/Other    Renal/GU      Musculoskeletal   Abdominal   Peds  Hematology   Anesthesia Other Findings   Reproductive/Obstetrics                             Lab Results  Component Value Date   WBC 14.5 (H) 02/10/2018   HGB 11.6 (L) 02/10/2018   HCT 35.2 (L) 02/10/2018   MCV 88.2 02/10/2018   PLT 290 02/10/2018    Anesthesia Physical Anesthesia Plan  ASA: II  Anesthesia Plan: MAC   Post-op Pain Management:    Induction: Intravenous  PONV Risk Score and Plan:   Airway Management Planned: Mask and Simple Face Mask  Additional Equipment:   Intra-op Plan:   Post-operative Plan:   Informed Consent: I have reviewed the patients History and Physical, chart, labs and discussed the procedure including the risks, benefits and alternatives for the proposed anesthesia with the patient or authorized representative who has indicated his/her understanding and acceptance.     Plan Discussed with: CRNA  Anesthesia Plan Comments:         Anesthesia Quick Evaluation

## 2018-02-10 NOTE — Anesthesia Procedure Notes (Signed)
Procedure Name: MAC Date/Time: 02/10/2018 8:42 AM Performed by: Wilburn Cornelia, CRNA Pre-anesthesia Checklist: Patient identified, Emergency Drugs available, Suction available, Patient being monitored and Timeout performed Oxygen Delivery Method: Nasal cannula Placement Confirmation: positive ETCO2 and breath sounds checked- equal and bilateral Dental Injury: Teeth and Oropharynx as per pre-operative assessment

## 2018-02-10 NOTE — Op Note (Signed)
[facility] Patient Name: Jacqueline Haney Procedure Date : 02/10/2018 MRN: [id number] Attending MD: Gatha Mayer , MD Date of Birth: 1955-08-25 CSN: [id number] Age: 62 Admit Type: Inpatient Procedure:                Upper GI endoscopy Indications:              Epigastric abdominal pain, Abnormal CT of the GI                            tract Providers:                Gatha Mayer, MD, Cleda Daub, RN, Tinnie Gens, Technician, Merrilyn Puma, CRNA Referring MD:              Medicines:                Propofol per Anesthesia, Monitored Anesthesia Care Complications:            No immediate complications. Estimated Blood Loss:     Estimated blood loss was minimal. Procedure:                Pre-Anesthesia Assessment:                           - Prior to the procedure, a History and Physical                            was performed, and patient medications and                            allergies were reviewed. The patient's tolerance of                            previous anesthesia was also reviewed. The risks                            and benefits of the procedure and the sedation                            options and risks were discussed with the patient.                            All questions were answered, and informed consent                            was obtained. Prior Anticoagulants: The patient has                            taken no previous anticoagulant or antiplatelet                            agents. ASA Grade Assessment: III - A patient with  severe systemic disease. After reviewing the risks                            and benefits, the patient was deemed in                            satisfactory condition to undergo the procedure.                           After obtaining informed consent, the endoscope was                            passed under direct vision. Throughout the         procedure, the patient's blood pressure, pulse, and                            oxygen saturations were monitored continuously. The                            was introduced through the mouth, and advanced to                            the second part of duodenum. The upper GI endoscopy                            was accomplished without difficulty. The patient                            tolerated the procedure well. Scope In: Scope Out: Findings:      Diffuse moderate mucosal changes characterized by congestion and       petechiae were found in the second portion of the duodenum. Biopsies       were taken with a cold forceps for histology. Verification of patient       identification for the specimen was done. Estimated blood loss was       minimal.      The exam was otherwise without abnormality.      The cardia and gastric fundus were normal on retroflexion. Impression:               - Mucosal changes in the duodenum. Biopsied.                           - The examination was otherwise normal. Recommendation:           - Return patient to hospital ward for ongoing care.                           - Clear liquid diet.                           - Continue present medications.                           - ? if her median arcuate ligament syndrome is  responsible for this                           I doubt my bxs will be pathognomonic                           Supportive care                           advance diet as tolerated                           I almost stopped Abx - probably do by tomorrow - I                            doubt there is infection or risk sigf based upon                            this duodenitis appearance                           Would ask vascular surgery for their opinion about                            ischemia from median arcuate syndrome Procedure Code(s):        --- Professional ---                           [phone],  Esophagogastroduodenoscopy, flexible,                            transoral; with biopsy, single or multiple Diagnosis Code(s):        --- Professional ---                           K31.89, Other diseases of stomach and duodenum                           R10.13, Epigastric pain                           R93.3, Abnormal findings on diagnostic imaging of                            other parts of digestive tract CPT copyright 2017 [employer]. All rights reserved. The codes documented in this report are preliminary and upon coder review may  be revised to meet current compliance requirements. Gatha Mayer, MD 02/10/2018 9:10:27 AM This report has been signed electronically. Number of Addenda: 0

## 2018-02-10 NOTE — Transfer of Care (Signed)
Immediate Anesthesia Transfer of Care Note  Patient: Jacqueline Haney  Procedure(s) Performed: ESOPHAGOGASTRODUODENOSCOPY (EGD) WITH PROPOFOL (N/A )  Patient Location: Endoscopy Unit  Anesthesia Type:MAC  Level of Consciousness: awake and alert   Airway & Oxygen Therapy: Patient Spontanous Breathing and Patient connected to nasal cannula oxygen  Post-op Assessment: Report given to RN and Post -op Vital signs reviewed and stable  Post vital signs: Reviewed and stable  Last Vitals:  Vitals Value Taken Time  BP 90/56 02/10/2018  9:01 AM  Temp    Pulse 70 02/10/2018  9:01 AM  Resp 15 02/10/2018  9:01 AM  SpO2 94 % 02/10/2018  9:01 AM  Vitals shown include unvalidated device data.  Last Pain:  Vitals:   02/10/18 0743  TempSrc: Oral  PainSc: 9       Patients Stated Pain Goal: 3 (64/68/03 2122)  Complications: No apparent anesthesia complications

## 2018-02-10 NOTE — Progress Notes (Signed)
PROGRESS NOTE  Jacqueline Haney  OAC:[id number] DOB: Apr 01, 1956 DOA: 02/08/2018 PCP: Jani Gravel, MD   Brief Narrative: Jacqueline Haney is a 62 y.o. female with a history of daily headaches and ibuprofen use who presented to [facility] ED for abrupt onset of severe abdominal pain radiating to the back while at her Nephew's funeral. Her exam was consistent with peritonitis. ED evaluation included CT angio chest/abdomen/pelvis which demonstrated heterogenous enhancement of the head of the pancreas, severe wall thickening along the duodenum and features consistent with retroperitoneal bleeding vs. soft tissue mass also possible. Lipase was normal. There was leukocytosis but reassuringly normal lactic acid. Surgery was consulted and the patient was kept NPO, transferred to [facility]. EGD 6/28 showed severe duodenitis, biopsies taken, but no ulcer noted  Assessment & Plan: Active Problems:   Retroperitoneal bleed   Abdominal pain, epigastric   Abnormal CT scan, gastrointestinal tract   Excessive use of nonsteroidal anti-inflammatory drugs (NSAIDs)   Duodenitis  Abdominal pain with duodenitis:  - Follow up biopsies taken at EGD 6/28 by Dr. Carlean Purl.  - Continue PPI and prn analgesics. Will need to transition to po.  - Liquid diet per GI.  - General surgery following, no interventions planned. - Stop NSAIDs.  - With leukocytosis, covering with zosyn, though no definite source of infection has been found, may monitor off antibiotics.  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  - Per GI recommendations, I have consulted vascular surgery.   Retroperitoneal bleeding: Hgb and vital signs stable so far. ?spontaneous - Continue monitoring CBC.  - Vascular surgery recommendations appreciated  Tobacco use:  - Cessation counseling provided. She was not receptive.   Chronic daily headache: No nausea, vision changes, aura, and is  bilateral. Not consistent with typical migraine features. 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  - Stop NSAIDs as above - No absolute contraindication to triptan so will reorder prn  DVT prophylaxis: SCDs Code Status: Full Family Communication: At bedside Disposition Plan: Home when work up completed, hgb stable, and pain controlled with oral medications.   Consultants:   GI  General surgery  Procedures:   EGD 02/10/2018  Antimicrobials:  Zosyn 6/26 >>    Subjective: Abd pain improved, not present without palpation but remains severe when touched or with some movements. No bleeding, dyspnea, chest pain, palpitations, leg swelling noted. Wants to go home.  Objective: Vitals:   02/10/18 0907 02/10/18 0910 02/10/18 0919 02/10/18 1138  BP: 106/67 (!) 117/56 113/72 119/62  Pulse: 71 75  74  Resp: 17 19  18   Temp:    98 F (36.7 C)  TempSrc:    Oral  SpO2: 95% 92%  96%  Weight:      Height:        Intake/Output Summary (Last 24 hours) at 02/10/2018 1723 Last data filed at 02/10/2018 1355 Gross per 24 hour  Intake 2651.65 ml  Output -  Net 2651.65 ml   Filed Weights   02/08/18 1354  Weight: 83.5 kg (184 lb)   Gen: 62 y.o. female in no distress Pulm: Nonlabored breathing room air. Clear. CV: Regular rate and rhythm. No murmur, rub, or gallop. No JVD, no dependent edema. GI: Diffuse, significant tenderness to light palpation worst in epigastrium with guarding, no rebound. Nondistended. +BS.  Ext: Warm, no deformities Skin: No rashes, lesions or ulcers on visualized skin.  Neuro: Alert and oriented. No focal neurological deficits. Psych: Judgement and insight  appear fair. Mood euthymic & affect congruent. Behavior is appropriate.    Data Reviewed: I have personally reviewed following labs and imaging studies  CBC: Recent Labs  Lab 02/08/18 1408 02/08/18 1904 02/09/18 1643 02/10/18 0605  WBC 15.4* 19.6* 15.9* 14.5*  NEUTROABS  --  15.5*  --   --   HGB  13.9 13.1 11.9* 11.6*  HCT 41.3 39.4 35.4* 35.2*  MCV 87.7 87.8 86.6 88.2  PLT 199 297 287 790   Basic Metabolic Panel: Recent Labs  Lab 02/08/18 1408 02/08/18 1904  NA 140  --   K 3.5  --   CL 110  --   CO2 20*  --   GLUCOSE 131*  --   BUN 16  --   CREATININE 0.60  --   CALCIUM 9.2  --   MG  --  1.7  PHOS  --  4.5   GFR: Estimated Creatinine Clearance: 79.4 mL/min (by C-G formula based on SCr of 0.6 mg/dL). Liver Function Tests: Recent Labs  Lab 02/08/18 1408  AST 23  ALT 24  ALKPHOS 70  BILITOT 0.5  PROT 7.1  ALBUMIN 3.8   Recent Labs  Lab 02/08/18 1408  LIPASE 22   No results for input(s): AMMONIA in the last 168 hours. Coagulation Profile: Recent Labs  Lab 02/08/18 1904  INR 1.00   Cardiac Enzymes: Recent Labs  Lab 02/08/18 1431  TROPONINI <0.03   BNP (last 3 results) No results for input(s): PROBNP in the last 8760 hours. HbA1C: No results for input(s): HGBA1C in the last 72 hours. CBG: Recent Labs  Lab 02/08/18 2145 02/09/18 0128  GLUCAP 142* 134*   Lipid Profile: No results for input(s): CHOL, HDL, LDLCALC, TRIG, CHOLHDL, LDLDIRECT in the last 72 hours. Thyroid Function Tests: No results for input(s): TSH, T4TOTAL, FREET4, T3FREE, THYROIDAB in the last 72 hours. Anemia Panel: No results for input(s): VITAMINB12, FOLATE, FERRITIN, TIBC, IRON, RETICCTPCT in the last 72 hours. Urine analysis:    Component Value Date/Time   COLORURINE YELLOW 02/08/2018 1600   APPEARANCEUR CLEAR 02/08/2018 1600   LABSPEC 1.031 (H) 02/08/2018 1600   PHURINE 5.0 02/08/2018 1600   GLUCOSEU NEGATIVE 02/08/2018 1600   HGBUR SMALL (A) 02/08/2018 1600   BILIRUBINUR NEGATIVE 02/08/2018 1600   KETONESUR NEGATIVE 02/08/2018 1600   PROTEINUR 30 (A) 02/08/2018 1600   UROBILINOGEN 0.2 05/16/2012 0213   NITRITE NEGATIVE 02/08/2018 1600   LEUKOCYTESUR NEGATIVE 02/08/2018 1600   Recent Results (from the past 240 hour(s))  Surgical pcr screen     Status: None    Collection Time: 02/09/18  2:52 AM  Result Value Ref Range Status   MRSA, PCR NEGATIVE NEGATIVE Final   Staphylococcus aureus NEGATIVE NEGATIVE Final    Comment: (NOTE) The Xpert SA Assay (FDA approved for NASAL specimens in patients 77 years of age and older), is one component of a comprehensive surveillance program. It is not intended to diagnose infection nor to guide or monitor treatment. Performed at [facility], [place] [address], [place], [place] [address]       Radiology Studies: No results found.  Scheduled Meds: . diphenhydrAMINE  25 mg Intravenous Once  . pantoprazole  40 mg Oral QAC breakfast   Continuous Infusions: . sodium chloride 100 mL/hr at 02/10/18 1627  . piperacillin-tazobactam (ZOSYN)  IV 3.375 g (02/10/18 1629)     LOS: 2 days   Time spent: 25 minutes.  Patrecia Pour, MD [facility] [email or web address] Password [email or web address]  02/10/2018, 5:23 PM

## 2018-02-10 NOTE — Progress Notes (Addendum)
Hospital Consult    Reason for Consult:  Celiac artery compression Requesting Physician:  Dr. Carlean Purl MRN #:  [id number]  History of Present Illness: This is a 62 y.o. female who is admitted to the hospital with severe abdominal pain with concurrent nausea.  CTA abdomen pelvis demonstrated compression of celiac artery suggesting median arcuate ligament syndrome as well as retroperitoneal hemorrhage vs mass near pancreas.  On exam patient is without pain at rest.  Patient states for more than 10 years she has noticed postprandial pain after a full meal however does not experience any pain if she does not over-eat.  Patient states today she has an appetite and would like to have a full diet.  She denies fear of food and any significant weight loss.  Patient states she has gained 10 pounds this year and postprandial pain does not keep her from eating.  Work-up also included upper endoscopy which per GI was likely noncontributory to epigastric pain.  She is taking an aspirin daily.    Past Medical History:  Diagnosis Date  . Chest pain    ACS rules out stress test negative  . Hyperlipidemia    refuses meds/diet controlled  . Migraine     Past Surgical History:  Procedure Laterality Date  . ABDOMINAL HYSTERECTOMY    . SPLENECTOMY      Allergies  Allergen Reactions  . Aspirin Nausea And Vomiting  . Codeine Other (See Comments)    Alters mental status "climbs walls/feels high"  . Hydrocodone Other (See Comments)    Alters mental status "climbs walls/feels high"  . Oxycodone-Acetaminophen Other (See Comments)    Alters mental status "climbing a while/high"  . Tape Dermatitis    Prior to Admission medications   Medication Sig Start Date End Date Taking? Authorizing Provider  ibuprofen (ADVIL,MOTRIN) 200 MG tablet Take 800 mg by mouth every 6 (six) hours as needed.   Yes [provider]  SUMAtriptan (IMITREX) 50 MG tablet Take 50 mg by mouth every 2 (two) hours as needed for  migraine or headache.  09/16/14  Yes [provider]    Social History   Socioeconomic History  . Marital status: Divorced    Spouse name: Not on file  . Number of children: Not on file  . Years of education: Not on file  . Highest education level: Not on file  Occupational History  . Not on file  Social Needs  . Financial resource strain: Not on file  . Food insecurity:    Worry: Not on file    Inability: Not on file  . Transportation needs:    Medical: Not on file    Non-medical: Not on file  Tobacco Use  . Smoking status: Current Every Day Smoker    Packs/day: 0.50    Types: Cigarettes  . Smokeless tobacco: Never Used  Substance and Sexual Activity  . Alcohol use: No  . Drug use: No  . Sexual activity: Yes    Birth control/protection: Surgical  Lifestyle  . Physical activity:    Days per week: Not on file    Minutes per session: Not on file  . Stress: Not on file  Relationships  . Social connections:    Talks on phone: Not on file    Gets together: Not on file    Attends religious service: Not on file    Active member of club or organization: Not on file    Attends meetings of clubs or organizations: Not on  file    Relationship status: Not on file  . Intimate partner violence:    Fear of current or ex partner: Not on file    Emotionally abused: Not on file    Physically abused: Not on file    Forced sexual activity: Not on file  Other Topics Concern  . Not on file  Social History Narrative  . Not on file     Family History  Problem Relation Age of Onset  . Cancer Mother   . Cancer Father   . Kidney failure Sister   . Heart attack Sister   . Cancer Brother   . Cancer Other     ROS: Otherwise negative unless mentioned in HPI  Physical Examination  Vitals:   02/10/18 0919 02/10/18 1138  BP: 113/72 119/62  Pulse:  74  Resp:  18  Temp:  98 F (36.7 C)  SpO2:  96%   Body mass index is 29.7 kg/m.  General:  WDWN in NAD Gait: Not  observed HENT: WNL, normocephalic Pulmonary: normal non-labored breathing, without Rales, rhonchi,  wheezing Cardiac: regular Abdomen:  soft, tender in epigastric area; no audible bruit with expiration Skin: without rashes Vascular Exam/Pulses: Symmetrical radial pulses; symmetrical PT pulses Extremities: without ischemic changes, without Gangrene , without cellulitis; without open wounds;  Musculoskeletal: no muscle wasting or atrophy  Neurologic: A&O X 3;  No focal weakness or paresthesias are detected; speech is fluent/normal Psychiatric:  The pt has Normal affect. Lymph:  Unremarkable  CBC    Component Value Date/Time   WBC 14.5 (H) 02/10/2018 0605   RBC 3.99 02/10/2018 0605   HGB 11.6 (L) 02/10/2018 0605   HCT 35.2 (L) 02/10/2018 0605   PLT 290 02/10/2018 0605   MCV 88.2 02/10/2018 0605   MCH 29.1 02/10/2018 0605   MCHC 33.0 02/10/2018 0605   RDW 13.9 02/10/2018 0605   LYMPHSABS 3.2 02/08/2018 1904   MONOABS 0.9 02/08/2018 1904   EOSABS 0.0 02/08/2018 1904   BASOSABS 0.0 02/08/2018 1904    BMET    Component Value Date/Time   NA 140 02/08/2018 1408   K 3.5 02/08/2018 1408   CL 110 02/08/2018 1408   CO2 20 (L) 02/08/2018 1408   GLUCOSE 131 (H) 02/08/2018 1408   BUN 16 02/08/2018 1408   CREATININE 0.60 02/08/2018 1408   CALCIUM 9.2 02/08/2018 1408   GFRNONAA >60 02/08/2018 1408   GFRAA >60 02/08/2018 1408    COAGS: Lab Results  Component Value Date   INR 1.00 02/08/2018   INR 0.97 04/28/2010     Non-Invasive Vascular Imaging:   IMPRESSION: Vascular:  No evidence of aortic dissection.  Nonvascular:  There is retroperitoneal hemorrhage associated with the head of the pancreas and duodenum as described above. These may represent a complication of acute pancreatitis. There is no evidence of pancreatic necrosis. Alternatively, the soft tissue density may represent neoplasm rather than hemorrhage.   Electronically Signed   By: Marybelle Killings M.D.    On: 02/08/2018 16:06  Statin:  No. Beta Blocker:  No. Aspirin:  Yes.   ACEI:  No. ARB:  No. CCB use:  No Other antiplatelets/anticoagulants:  No.   ASSESSMENT/PLAN: This is a 62 y.o. female with epigastric abdominal pain with celiac artery compression and retroperitoneal bleed by CTA  Etiology of abd pain unclear; it is possible there is a component of median arcuate ligament syndrome however patient has historically been able to tolerate a regular diet, and denies weight loss and  fear of food.  Question involvement of retroperitoneal bleed in relation to increased abd pain.  We will proceed conservatively and plan to follow up with patient as an outpatient with consideration for inspiration/expiration duplex vs angiography.  No diet restrictions from vascular surgery standpoint.  Dr. Trula Slade will evaluate the patient later today.     Dagoberto Ligas PA-C [facility] [phone]

## 2018-02-10 NOTE — Progress Notes (Signed)
[facility] Progress Note  Day of Surgery  Subjective: CC- abdominal pain Patient states that she is feeling better today than yesterday. Pain previously 10/10, now 8/10. Denies n/v. Sipping on clears, and this does make her pain worse. Patient reports chronic upper abdominal pain. States that it bothers her every day after she eats, although it had never been as bad as this episode.  Underwent upper endoscopy this morning by Dr. Carlean Purl. This showed diffuse moderate mucosal changes characterized by congestion and petechiae in the second portion of the duodenum; biopsies taken and pending.  Hemoglobin and vital signs stable.  Objective: Vital signs in last 24 hours: Temp:  [97.6 F (36.4 C)-99.2 F (37.3 C)] 99.2 F (37.3 C) (06/28 0900) Pulse Rate:  [66-80] 75 (06/28 0910) Resp:  [17-27] 19 (06/28 0910) BP: (90-128)/(51-82) 113/72 (06/28 0919) SpO2:  [92 %-96 %] 92 % (06/28 0910) Last BM Date: (PTA)  Intake/Output from previous day: 06/27 0701 - 06/28 0700 In: 3250.7 [P.O.:960; I.V.:2034.3; IV Piggyback:256.4] Out: -  Intake/Output this shift: Total I/O In: 100 [I.V.:100] Out: -   PE: Gen:  Alert, NAD HEENT: EOM's intact, pupils equal and round Card:  RRR, no M/G/R heard Pulm:  CTAB, no W/R/R, effort normal Abd: Soft, ND, +BS, no HSM, no hernia, TTP periumbilical and epigastric region with voluntary guarding, no rebound Ext:  Calves soft and nontender Psych: A&Ox3  Skin: no rashes noted, warm and dry  Lab Results:  Recent Labs    02/09/18 1643 02/10/18 0605  WBC 15.9* 14.5*  HGB 11.9* 11.6*  HCT 35.4* 35.2*  PLT 287 290   BMET Recent Labs    02/08/18 1408  NA 140  K 3.5  CL 110  CO2 20*  GLUCOSE 131*  BUN 16  CREATININE 0.60  CALCIUM 9.2   PT/INR Recent Labs    02/08/18 1904  LABPROT 13.1  INR 1.00   CMP     Component Value Date/Time   NA 140 02/08/2018 1408   K 3.5 02/08/2018 1408   CL 110 02/08/2018 1408   CO2 20 (L)  02/08/2018 1408   GLUCOSE 131 (H) 02/08/2018 1408   BUN 16 02/08/2018 1408   CREATININE 0.60 02/08/2018 1408   CALCIUM 9.2 02/08/2018 1408   PROT 7.1 02/08/2018 1408   ALBUMIN 3.8 02/08/2018 1408   AST 23 02/08/2018 1408   ALT 24 02/08/2018 1408   ALKPHOS 70 02/08/2018 1408   BILITOT 0.5 02/08/2018 1408   GFRNONAA >60 02/08/2018 1408   GFRAA >60 02/08/2018 1408   Lipase     Component Value Date/Time   LIPASE 22 02/08/2018 1408       Studies/Results: Dg Abdomen Acute W/chest  Result Date: 02/08/2018 CLINICAL DATA:  Acute onset abdominal and back pain. Evaluate for pneumoperitoneum. EXAM: DG ABDOMEN ACUTE W/ 1V CHEST COMPARISON:  CT abdomen pelvis dated August 21, 2017. Chest x-ray dated October 08, 2016. FINDINGS: There is no evidence of dilated bowel loops or free intraperitoneal air. No radiopaque calculi or other significant radiographic abnormality is seen. Heart size and mediastinal contours are within normal limits. Both lungs are clear. No acute osseous abnormality. IMPRESSION: Negative abdominal radiographs.  No acute cardiopulmonary disease. Electronically Signed   By: Titus Dubin M.D.   On: 02/08/2018 15:28   Ct Angio Chest/abd/pel For Dissection W And/or Wo Contrast  Result Date: 02/08/2018 CLINICAL DATA:  Severe abdominal pain and back pain EXAM: CT ANGIOGRAPHY CHEST, ABDOMEN AND PELVIS TECHNIQUE: Multidetector CT imaging through  the chest, abdomen and pelvis was performed using the standard protocol during bolus administration of intravenous contrast. Multiplanar reconstructed images and MIPs were obtained and reviewed to evaluate the vascular anatomy. CONTRAST:  186mL ISOVUE-370 IOPAMIDOL (ISOVUE-370) INJECTION 76% COMPARISON:  08/21/2017.  05/10/2013. FINDINGS: CTA CHEST FINDINGS Cardiovascular: There is no evidence of acute intramural hematoma or aortic dissection. Maximal diameter of the ascending aorta is 3.4 cm. There is no obvious evidence of acute pulmonary  thromboembolism. Great vessels are patent. Vertebral arteries are patent within the confines of the study. No significant coronary artery calcifications. Mediastinum/Nodes: No abnormal mediastinal adenopathy. No pericardial effusion. Visualized thyroid is unremarkable. Lungs/Pleura: No pneumothorax. No pleural effusion. Minimal scarring at the lung apices. Minimal dependent atelectasis in the lungs. Musculoskeletal: No vertebral compression deformity. No acute rib fracture. Review of the MIP images confirms the above findings. CTA ABDOMEN AND PELVIS FINDINGS VASCULAR Aorta: No evidence of aortic dissection or aneurysm. Aorta is patent. Mild atherosclerotic calcifications and minimal soft plaque in the lower abdominal aorta. 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 Branch vessels are patent. Accessory left hepatic artery anatomy. SMA: Patent. Renals: Single renal arteries are patent. IMA: Patent. Inflow: Bilateral common iliac, external iliac, and internal iliac arteries are patent. Review of the MIP images confirms the above findings. NON-VASCULAR Hepatobiliary: Heterogeneous enhancement likely due to arterial phase imaging. No focal mass. Gallbladder is within normal limits. Pancreas: There is stranding and blurring of the fat planes in the head of the pancreas. There is heterogeneous enhancement in the head of the pancreas. These findings support acute pancreatitis. There is no evidence of pancreatic necrosis. There is stranding which extends from the head of the pancreas inferiorly, surrounding the duodenum, and into the retroperitoneum. Hounsfield unit measurements are elevated. These findings are worrisome for retroperitoneal hemorrhage. Soft tissue mass would be a secondary consideration. Spleen: Post splenectomy. Adrenals/Urinary Tract: Tiny calculus in the lower pole of the right kidney. Left kidney is unremarkable. Stable left adrenal nodule. Unremarkable bladder. Stomach/Bowel:  There is severe wall thickening and edema of the duodenum, associated with inflammation of the pancreatic head. As described above, high density stranding around the duodenum is noted worrisome for retroperitoneal hemorrhage. No evidence of small-bowel obstruction. Normal appendix. No obvious mass in the colon. Lymphatic: No abnormal retroperitoneal adenopathy. Reproductive: Uterus is absent.  Adnexa are within normal limits. Other: No free fluid. Musculoskeletal: No vertebral compression deformity. Review of the MIP images confirms the above findings. IMPRESSION: Vascular: No evidence of aortic dissection. Nonvascular: There is retroperitoneal hemorrhage associated with the head of the pancreas and duodenum as described above. These may represent a complication of acute pancreatitis. There is no evidence of pancreatic necrosis. Alternatively, the soft tissue density may represent neoplasm rather than hemorrhage. Electronically Signed   By: Marybelle Killings M.D.   On: 02/08/2018 16:06    Anti-infectives: Anti-infectives (From admission, onward)   Start     Dose/Rate Route Frequency Ordered Stop   02/09/18 0600  piperacillin-tazobactam (ZOSYN) IVPB 3.375 g     3.375 g 12.5 mL/hr over 240 Minutes Intravenous Every 8 hours 02/09/18 0241     02/09/18 0115  piperacillin-tazobactam (ZOSYN) IVPB 3.375 g  Status:  Discontinued     3.375 g 100 mL/hr over 30 Minutes Intravenous Every 8 hours 02/09/18 0101 02/09/18 0240   02/08/18 1630  piperacillin-tazobactam (ZOSYN) IVPB 3.375 g     3.375 g 100 mL/hr over 30 Minutes Intravenous  Once 02/08/18 1618 02/08/18 1655  Assessment/Plan Headaches Tobacco abuse H/o splenectomy  Epigastric abdominal pain - CTA showed retroperitoneal hemorrhage associated with the head of the pancreas and duodenum, concern for pancreatitis vs ulcer; median arcuate ligament syndrome - lipase 22 (6/26) - patient started on PPI and zosyn - upper endoscopy today showed diffuse  moderate mucosal changes characterized by congestion and petechiae were found in the second portion of the duodenum; biopsy pending  ID - zosyn 6/26>> FEN - IVF, CLD VTE - SCDs Foley - none  Plan - Pain improving but still present. No peritonitis on exam and patient hemodynamically stable. Duodenitis seen but no ulcer on upper endoscopy today. Talked to Dr. Bonner Puna, he will discuss case with vascular surgery for their opinion about ischemia from median arcuate syndrome.   LOS: 2 days    Wellington Hampshire , [facility] 02/10/2018, 11:10 AM Pager: [phone] Consults: [phone] Mon 7:00 am -11:30 AM Tues-Fri 7:00 am-4:30 pm Sat-Sun 7:00 am-11:30 am

## 2018-02-10 NOTE — Interval H&P Note (Signed)
History and Physical Interval Note:  02/10/2018 8:39 AM  Jacqueline Haney  has presented today for surgery, with the diagnosis of Epigastric pain, Abnormal CT Abdomen  The various methods of treatment have been discussed with the patient and family. After consideration of risks, benefits and other options for treatment, the patient has consented to  Procedure(s): ESOPHAGOGASTRODUODENOSCOPY (EGD) WITH PROPOFOL (N/A) as a surgical intervention .  The patient's history has been reviewed, patient examined, no change in status, stable for surgery.  I have reviewed the patient's chart and labs.  Questions were answered to the patient's satisfaction.     Silvano Rusk

## 2018-02-11 ENCOUNTER — Encounter (HOSPITAL_COMMUNITY): Payer: Self-pay | Admitting: Internal Medicine

## 2018-02-11 LAB — CBC
HEMATOCRIT: 33.3 % — AB (ref 36.0–46.0)
HEMOGLOBIN: 10.9 g/dL — AB (ref 12.0–15.0)
MCH: 28.9 pg (ref 26.0–34.0)
MCHC: 32.7 g/dL (ref 30.0–36.0)
MCV: 88.3 fL (ref 78.0–100.0)
Platelets: 296 10*3/uL (ref 150–400)
RBC: 3.77 MIL/uL — ABNORMAL LOW (ref 3.87–5.11)
RDW: 13.6 % (ref 11.5–15.5)
WBC: 14.8 10*3/uL — AB (ref 4.0–10.5)

## 2018-02-11 MED ORDER — TRAMADOL HCL 50 MG PO TABS: 50.0000 mg | ORAL_TABLET | Freq: Four times a day (QID) | ORAL | Status: DC | PRN

## 2018-02-11 MED ORDER — HYDROMORPHONE HCL 1 MG/ML IJ SOLN: 1.0000 mg | INTRAMUSCULAR | Status: DC | PRN

## 2018-02-11 NOTE — Progress Notes (Signed)
1 Day Post-Op   Subjective/Chief Complaint: Complains of chronic abd pain but tolerating diet   Objective: Vital signs in last 24 hours: Temp:  [98 F (36.7 C)-99.4 F (37.4 C)] 98.3 F (36.8 C) (06/29 0932) Pulse Rate:  [65-77] 65 (06/29 0932) Resp:  [18-23] 20 (06/28 2300) BP: (111-120)/(62-74) 111/74 (06/29 0932) SpO2:  [95 %-96 %] 95 % (06/29 0300) Last BM Date: 02/06/18  Intake/Output from previous day: 06/28 0701 - 06/29 0700 In: 599.8 [P.O.:360; I.V.:199.6; IV Piggyback:40.2] Out: -  Intake/Output this shift: No intake/output data recorded.  General appearance: alert and cooperative Resp: clear to auscultation bilaterally Cardio: regular rate and rhythm GI: soft, moderate tenderness  Lab Results:  Recent Labs    02/10/18 0605 02/11/18 0343  WBC 14.5* 14.8*  HGB 11.6* 10.9*  HCT 35.2* 33.3*  PLT 290 296   BMET Recent Labs    02/08/18 1408  NA 140  K 3.5  CL 110  CO2 20*  GLUCOSE 131*  BUN 16  CREATININE 0.60  CALCIUM 9.2   PT/INR Recent Labs    02/08/18 1904  LABPROT 13.1  INR 1.00   ABG No results for input(s): PHART, HCO3 in the last 72 hours.  Invalid input(s): PCO2, PO2  Studies/Results: No results found.  Anti-infectives: Anti-infectives (From admission, onward)   Start     Dose/Rate Route Frequency Ordered Stop   02/09/18 0600  piperacillin-tazobactam (ZOSYN) IVPB 3.375 g     3.375 g 12.5 mL/hr over 240 Minutes Intravenous Every 8 hours 02/09/18 0241     02/09/18 0115  piperacillin-tazobactam (ZOSYN) IVPB 3.375 g  Status:  Discontinued     3.375 g 100 mL/hr over 30 Minutes Intravenous Every 8 hours 02/09/18 0101 02/09/18 0240   02/08/18 1630  piperacillin-tazobactam (ZOSYN) IVPB 3.375 g     3.375 g 100 mL/hr over 30 Minutes Intravenous  Once 02/08/18 1618 02/08/18 1655      Assessment/Plan: s/p Procedure(s): ESOPHAGOGASTRODUODENOSCOPY (EGD) WITH PROPOFOL (N/A) Advance diet  Headaches Tobacco abuse H/o  splenectomy  Epigastric abdominal pain - CTA showed retroperitoneal hemorrhage associated with the head of the pancreas and duodenum, concern for pancreatitis vs ulcer; 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 No peritonitis on exam and patient hemodynamically stable. Duodenitis seen but no ulcer on upper endoscopy today. Talked to Dr. Bonner Puna, he will discuss case with vascular surgery for their opinion about ischemia from median arcuate syndrome.    LOS: 3 days    TOTH III,PAUL S 02/11/2018

## 2018-02-11 NOTE — Progress Notes (Addendum)
PROGRESS NOTE  Jacqueline Haney  PTW:[id number] DOB: 10-06-1955 DOA: 02/08/2018 PCP: Jani Gravel, MD   Brief Narrative: Jacqueline Haney is a 62 y.o. female with a history of daily headaches and ibuprofen use who presented to [facility] ED for abrupt onset of severe abdominal pain radiating to the back while at her Nephew's funeral. Her exam was consistent with peritonitis. ED evaluation included CT angio chest/abdomen/pelvis which demonstrated heterogenous enhancement of the head of the pancreas, severe wall thickening along the duodenum and features consistent with retroperitoneal bleeding vs. soft tissue mass also possible. Lipase was normal. There was leukocytosis but reassuringly normal lactic acid. Surgery was consulted and the patient was kept NPO, transferred to [facility]. EGD 6/28 showed severe duodenitis, biopsies taken, but no ulcer noted. Consideration for median arcuate ligament syndrome, so vascular surgery was consulted. Recommendation is to advance diet, continue avoiding NSAIDs, and repeat CT abdomen possibly with angiography as an outpatient. Hemoglobin has continued slow decline.   Assessment & Plan: Active Problems:   Retroperitoneal bleed   Abdominal pain, epigastric   Abnormal CT scan, gastrointestinal tract   Excessive use of nonsteroidal anti-inflammatory drugs (NSAIDs)   Duodenitis  Abdominal pain with duodenitis:  - Follow up biopsies taken at EGD 6/28 by Dr. Carlean Purl.  - Continue PPI and prn analgesics. Will try tramadol in lieu of IV dilaudid. Multiple narcotic allergies listed, but has tolerated dilaudid.  - Will advance to soft diet as no interventions currently planned.  - General surgery following, no interventions planned. - Stop NSAIDs.  - With leukocytosis, covering with zosyn, though no definite source of infection has been found, may monitor off antibiotics.  Median arcuate ligament syndrome: Possible diagnosis since other causes of chronic abdominal pain seem to be  excluded.  - Per GI recommendations, I have consulted vascular surgery.   Retroperitoneal bleeding: ?spontaneous - Continue monitoring CBC. Hgb slightly trending downward but has not seemingly stabilized. Vitals reassuring. Will monitor CBC in AM. If stable can DC. - Vascular surgery recommendations appreciated: per their note "proceed conservatively and plan to follow up with patient as an outpatient with consideration for inspiration/expiration duplex vs angiography."  Abnormal CT abdomen: The retroperitoneal findings as above could also represent soft tissue mass.  - Will need repeat imaging. This was emphasized to the patient.   Tobacco use:  - Cessation counseling provided. She was not receptive.   Chronic daily headache: No nausea, vision changes, aura, and is bilateral. Not consistent with typical migraine features. 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  - Stop NSAIDs as above - No absolute contraindication to triptan so reordered prn  DVT prophylaxis: SCDs Code Status: Full Family Communication: At bedside Disposition Plan: Will transfer to floor. Home possible 6/30 if tolerated advancing diet and hgb stable.   Consultants:   GI  General surgery  Vascular surgery  Procedures:   EGD 02/10/2018  Antimicrobials:  Zosyn 6/26 >>    Subjective: Abdominal pain continues to improve. Feels well this morning. No dyspnea, chest pain, palpitations, lightheadedness or unusual bleeding/bruising.   Objective: Vitals:   02/10/18 1900 02/10/18 2300 02/11/18 0300 02/11/18 0932  BP: 120/74 114/65 115/71 111/74  Pulse: 77 73 77 65  Resp: (!) 23 20    Temp: 99.4 F (37.4 C) 98.9 F (37.2 C) 98.3 F (36.8 C) 98.3 F (36.8 C)  TempSrc: Oral Oral Oral Oral  SpO2: 96% 95% 95%   Weight:      Height:       No intake  or output data in the 24 hours ending 02/11/18 1406 Filed Weights   02/08/18 1354  Weight: 83.5 kg (184 lb)   Gen: 62 y.o. female in no distress Pulm:  Nonlabored breathing room air. Clear. CV: Regular rate and rhythm. No murmur, rub, or gallop. No JVD, no dependent edema. GI: Abdomen soft, minimally tender today, non-distended, with normoactive bowel sounds.  Ext: Warm, no deformities Skin: No rashes, lesions or ulcers on visualized skin.  Neuro: Alert and oriented. No focal neurological deficits. Psych: Judgement and insight appear fair. Mood euthymic & affect congruent. Behavior is appropriate.    Data Reviewed: I have personally reviewed following labs and imaging studies  CBC: Recent Labs  Lab 02/08/18 1408 02/08/18 1904 02/09/18 1643 02/10/18 0605 02/11/18 0343  WBC 15.4* 19.6* 15.9* 14.5* 14.8*  NEUTROABS  --  15.5*  --   --   --   HGB 13.9 13.1 11.9* 11.6* 10.9*  HCT 41.3 39.4 35.4* 35.2* 33.3*  MCV 87.7 87.8 86.6 88.2 88.3  PLT 199 297 287 290 258   Basic Metabolic Panel: Recent Labs  Lab 02/08/18 1408 02/08/18 1904  NA 140  --   K 3.5  --   CL 110  --   CO2 20*  --   GLUCOSE 131*  --   BUN 16  --   CREATININE 0.60  --   CALCIUM 9.2  --   MG  --  1.7  PHOS  --  4.5   GFR: Estimated Creatinine Clearance: 79.4 mL/min (by C-G formula based on SCr of 0.6 mg/dL). Liver Function Tests: Recent Labs  Lab 02/08/18 1408  AST 23  ALT 24  ALKPHOS 70  BILITOT 0.5  PROT 7.1  ALBUMIN 3.8   Recent Labs  Lab 02/08/18 1408  LIPASE 22   No results for input(s): AMMONIA in the last 168 hours. Coagulation Profile: Recent Labs  Lab 02/08/18 1904  INR 1.00   Cardiac Enzymes: Recent Labs  Lab 02/08/18 1431  TROPONINI <0.03   BNP (last 3 results) No results for input(s): PROBNP in the last 8760 hours. HbA1C: No results for input(s): HGBA1C in the last 72 hours. CBG: Recent Labs  Lab 02/08/18 2145 02/09/18 0128  GLUCAP 142* 134*   Lipid Profile: No results for input(s): CHOL, HDL, LDLCALC, TRIG, CHOLHDL, LDLDIRECT in the last 72 hours. Thyroid Function Tests: No results for input(s): TSH, T4TOTAL,  FREET4, T3FREE, THYROIDAB in the last 72 hours. Anemia Panel: No results for input(s): VITAMINB12, FOLATE, FERRITIN, TIBC, IRON, RETICCTPCT in the last 72 hours. Urine analysis:    Component Value Date/Time   COLORURINE YELLOW 02/08/2018 1600   APPEARANCEUR CLEAR 02/08/2018 1600   LABSPEC 1.031 (H) 02/08/2018 1600   PHURINE 5.0 02/08/2018 1600   GLUCOSEU NEGATIVE 02/08/2018 1600   HGBUR SMALL (A) 02/08/2018 1600   BILIRUBINUR NEGATIVE 02/08/2018 1600   KETONESUR NEGATIVE 02/08/2018 1600   PROTEINUR 30 (A) 02/08/2018 1600   UROBILINOGEN 0.2 05/16/2012 0213   NITRITE NEGATIVE 02/08/2018 1600   LEUKOCYTESUR NEGATIVE 02/08/2018 1600   Recent Results (from the past 240 hour(s))  Surgical pcr screen     Status: None   Collection Time: 02/09/18  2:52 AM  Result Value Ref Range Status   MRSA, PCR NEGATIVE NEGATIVE Final   Staphylococcus aureus NEGATIVE NEGATIVE Final    Comment: (NOTE) The Xpert SA Assay (FDA approved for NASAL specimens in patients 39 years of age and older), is one component of a comprehensive surveillance program. It  is not intended to diagnose infection nor to guide or monitor treatment. Performed at [facility], [place] [address], [place], [place] [address]       Radiology Studies: No results found.  Scheduled Meds: . diphenhydrAMINE  25 mg Intravenous Once  . pantoprazole  40 mg Oral QAC breakfast   Continuous Infusions: . sodium chloride 100 mL/hr at 02/10/18 1627  . piperacillin-tazobactam (ZOSYN)  IV 3.375 g (02/11/18 0544)     LOS: 3 days   Time spent: 25 minutes.  Patrecia Pour, MD [facility] [email or web address] Password [email or web address] 02/11/2018, 2:06 PM

## 2018-02-11 NOTE — Progress Notes (Addendum)
    Progress Note   Assessment / Plan:   Assessment: 1.  Abdominal pain: Initially suspected perforated viscus from duodenal ulcer given history of extreme ibuprofen usage of 10 tabs per day for migraine headaches for years, EGD findings as below with no ulcer; continue to consider arcuate ligament syndrome versus other 2.  Abnormal CT the abdomen  Plan: 1.  Recommend patient have repeat CT of the abdomen as an outpatient, defer to surgery for their judgment as far as timing 2.  Continue to advance diet as tolerated 3.  Continue supportive measures 4.  Appreciate vascular surgery's recommendations going forward 5.  At this time we will sign off.  There is no need for GI follow-up as an outpatient.   LOS: 3 days   Jacqueline Haney  02/11/2018, 1:17 PM  Pager # [phone]    [facility] Attending   I have taken an interval history, reviewed the chart and examined the patient. I agree with the Advanced Practitioner's note, impression and recommendations.   Vascular note reviewed - sxs from median arcuate syndrome can be tricy to sort out  Are we sure she had a retroperitoneal hemorrhage?  I am not convinced that we know that for sure  ? If an MR would add anything but at this point she seems better so a f/u CT seems ok  Gatha Mayer, MD, [facility] 02/11/2018 2:06 PM    Subjective  Chief Complaint: Epigastric Abdominal Pain, Abnormal CT abdomen  This morning, continues with some RUQ/Epigastric pain. Currently ordering her full liquid diet. No BM since admit. Denies any new complaints.    Objective   Vital signs in last 24 hours: Temp:  [98.3 F (36.8 C)-99.4 F (37.4 C)] 98.3 F (36.8 C) (06/29 0932) Pulse Rate:  [65-77] 65 (06/29 0932) Resp:  [20-23] 20 (06/28 2300) BP: (111-120)/(65-74) 111/74 (06/29 0932) SpO2:  [95 %-96 %] 95 % (06/29 0300) Last BM Date: 02/06/18 General:    AA female in NAD Heart:  Regular rate and rhythm; no  murmurs Lungs: Respirations even and unlabored, lungs CTA bilaterally Abdomen:  Soft, moderate epigastric/RUQ ttp and nondistended. Normal bowel sounds. Extremities:  Without edema. Neurologic:  Alert and oriented,  grossly normal neurologically. Psych:  Cooperative. Normal mood and affect.  Intake/Output from previous day: 06/28 0701 - 06/29 0700 In: 599.8 [P.O.:360; I.V.:199.6; IV Piggyback:40.2] Out: -   Lab Results: Recent Labs    02/09/18 1643 02/10/18 0605 02/11/18 0343  WBC 15.9* 14.5* 14.8*  HGB 11.9* 11.6* 10.9*  HCT 35.4* 35.2* 33.3*  PLT 287 290 296   BMET Recent Labs    02/08/18 1408  NA 140  K 3.5  CL 110  CO2 20*  GLUCOSE 131*  BUN 16  CREATININE 0.60  CALCIUM 9.2   LFT Recent Labs    02/08/18 1408  PROT 7.1  ALBUMIN 3.8  AST 23  ALT 24  ALKPHOS 70  BILITOT 0.5   PT/INR Recent Labs    02/08/18 1904  LABPROT 13.1  INR 1.00   EGD 02/10/2018, Dr. Carlean Purl: Impression: Mucosal changes in the duodenum, exam otherwise normal; recommendations question median arcuate ligament syndrome, biopsies pending, vascular surgery consult recommended

## 2018-02-12 LAB — CBC
HCT: 34.6 % — ABNORMAL LOW (ref 36.0–46.0)
Hemoglobin: 11.6 g/dL — ABNORMAL LOW (ref 12.0–15.0)
MCH: 29.1 pg (ref 26.0–34.0)
MCHC: 33.5 g/dL (ref 30.0–36.0)
MCV: 86.9 fL (ref 78.0–100.0)
PLATELETS: 305 10*3/uL (ref 150–400)
RBC: 3.98 MIL/uL (ref 3.87–5.11)
RDW: 13.2 % (ref 11.5–15.5)
WBC: 12.8 10*3/uL — ABNORMAL HIGH (ref 4.0–10.5)

## 2018-02-12 MED ORDER — PANTOPRAZOLE SODIUM 40 MG PO TBEC: 40.0000 mg | DELAYED_RELEASE_TABLET | Freq: Every day | ORAL | 0 refills | Status: DC

## 2018-02-12 NOTE — Discharge Summary (Addendum)
Physician Discharge Summary  Jacqueline Haney:[id number] DOB: 04/11/1956 DOA: 02/08/2018  PCP: Jani Gravel, MD  Admit date: 02/08/2018 Discharge date: 02/12/2018  Admitted From: Home Disposition: Home   Recommendations for Outpatient Follow-up:  1. Follow up with PCP in 1-2 weeks. 2. Continue smoking cessation efforts 3. Follow up biopsies taken at EGD 6/28 by Dr. Carlean Purl, pending at discharge.  4. Repeat CBC 5. Follow up with vascular surgery, Dr. Trula Slade for repeat abdominal imaging, concern for median arcuate ligament syndrome.   Home Health: None Equipment/Devices: None Discharge Condition: Stable CODE STATUS: Full Diet recommendation: Small meals  Brief/Interim Summary: Jacqueline Haney is a 62 y.o. female with a history of daily headaches and ibuprofen use who presented to [facility] ED for abrupt onset of severe abdominal pain radiating to the back while at her Nephew's funeral. Her exam was consistent with peritonitis. ED evaluation included CT angio chest/abdomen/pelvis which demonstrated heterogenous enhancement of the head of the pancreas, severe wall thickening along the duodenum and features consistent with retroperitoneal bleeding vs. soft tissue mass also possible. Lipase was normal. There was leukocytosis but reassuringly normal lactic acid. Surgery was consulted and the patient was kept NPO, transferred to [facility]. EGD 6/28 showed severe duodenitis, biopsies taken, but no ulcer noted. Consideration for median arcuate ligament syndrome, so vascular surgery was consulted. Recommendation is to advance diet, continue avoiding NSAIDs, and repeat CT abdomen possibly with angiography as an outpatient. Hemoglobin is stable with no evidence of ongoing bleeding.   Discharge Diagnoses:  Active Problems:   Retroperitoneal bleed   Abdominal pain, epigastric   Abnormal CT scan, gastrointestinal tract   Excessive use of nonsteroidal anti-inflammatory drugs (NSAIDs)   Duodenitis  Abdominal  pain with duodenitis:  - Continue PPI and prn analgesics. Urged to avoid NSAIDs and tobacco - With leukocytosis, coveredwith zosyn, though no definite source of infection has been found, will monitor off antibiotics.  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  - Follow up with vascular surgery as outpatient. Per Dr. Trula Slade 6/30: "Chronic abdominal pain: I have reviewed the patient's CT scan.  This certainly has the appearance of median arcuate ligament syndrome.  However, the patient states that her symptoms have been chronic however they are exacerbated with a retroperitoneal bleed.  She does not have a history of weight loss or food intolerance.  I discussed with the patient that I would not recommend urgent intervention for possible median arcuate ligament syndrome.  We can discuss this further as an outpatient.  She will likely need angiography and ultrasound evaluation with inspiratory and expiratory imaging."  Retroperitoneal bleeding: ?spontaneous.  - Continue monitoring CBC at follow up. Hgb stable at 11.6g/dl on day of discharge.  Abnormal CT abdomen: The retroperitoneal findings as above could also represent soft tissue mass.  - Will need repeat imaging. This was emphasized to the patient.   Tobacco use:  - Cessation counseling provided. She was not receptive.   Chronic daily headache: No nausea, vision changes, aura, and is bilateral. Not consistent with typical migraine features. 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  - Stop NSAIDs as above - No absolute contraindication to triptan so  can continue. Otherwise advised tylenol prn and to attempt to avoid medications more than 3x/week.   Discharge Instructions Discharge Instructions    Call MD for:  difficulty breathing, headache or visual disturbances   Complete by:  As directed    Call MD for:  extreme fatigue   Complete by:  As directed     Call MD for:  persistant dizziness or light-headedness   Complete by:  As directed    Call MD for:  persistant nausea and vomiting   Complete by:  As directed    Call MD for:  severe uncontrolled pain   Complete by:  As directed    Discharge instructions   Complete by:  As directed    You were admitted for abdominal pain and found to have an abnormal CAT scan of the abdomen. The EGD showed that your duodenum (first part of the small intestine) was very inflamed but there was no ulcer. On the CAT scan there was also concern for retroperitoneal bleeding (bleeding into the back of the abdomen) though this could also have been due to a soft tissue mass. Fortunately your blood counts have stabilized and you do not seem to be bleeding. You will need repeat imaging to check up on the bleeding vs. possible mass.   You need to stop taking any NSAIDs as these worsen the inflammation in the small intestine. These include BC, goody's powder, ibuprofen, motrin, excedrin, advil, aleve, naproxen. You may take tylenol as needed for headaches, though it is possible some of the headache is due to overuse of medications.   You must stop smoking. Speak with your doctor about ways to help.   Follow up with vascular surgery in the next month or so for recheck and repeat imaging.   Increase activity slowly   Complete by:  As directed      Allergies as of 02/12/2018      Reactions   Aspirin Nausea And Vomiting   Codeine Other (See Comments)   Alters mental status "climbs walls/feels high"   Hydrocodone Other (See Comments)   Alters mental status "climbs walls/feels high"   Oxycodone-acetaminophen Other (See Comments)   Alters mental status "climbing a while/high"   Tape Dermatitis      Medication List    STOP taking these medications   ibuprofen 200 MG tablet Commonly known as:  ADVIL,MOTRIN     TAKE these medications   pantoprazole 40 MG tablet Commonly known as:  PROTONIX Take 1 tablet (40 mg  total) by mouth daily.   SUMAtriptan 50 MG tablet Commonly known as:  IMITREX Take 50 mg by mouth every 2 (two) hours as needed for migraine or headache.      Follow-up Information    Jani Gravel, MD. Schedule an appointment as soon as possible for a visit in 1 week(s).   Specialty:  Internal Medicine Contact information: [place] [address] [phone]        Serafina Mitchell, MD. Schedule an appointment as soon as possible for a visit in 4 week(s).   Specialties:  Vascular Surgery, Cardiology Contact information: [address] [place] [address] [phone]          Allergies  Allergen Reactions  . Aspirin Nausea And Vomiting  . Codeine Other (See Comments)    Alters mental status "climbs walls/feels high"  . Hydrocodone Other (See Comments)    Alters mental status "climbs walls/feels high"  . Oxycodone-Acetaminophen Other (See Comments)    Alters mental status "climbing a while/high"  . Tape Dermatitis    Consultations:  Vascular surgery, Dr. Trula Slade  Gastroenterology, Dr. Carlean Purl  General surgery   Procedures/Studies: Dg Abdomen Acute W/chest  Result Date: 02/08/2018 CLINICAL DATA:  Acute onset abdominal and  back pain. Evaluate for pneumoperitoneum. EXAM: DG ABDOMEN ACUTE W/ 1V CHEST COMPARISON:  CT abdomen pelvis dated August 21, 2017. Chest x-ray dated October 08, 2016. FINDINGS: There is no evidence of dilated bowel loops or free intraperitoneal air. No radiopaque calculi or other significant radiographic abnormality is seen. Heart size and mediastinal contours are within normal limits. Both lungs are clear. No acute osseous abnormality. IMPRESSION: Negative abdominal radiographs.  No acute cardiopulmonary disease. Electronically Signed   By: Titus Dubin M.D.   On: 02/08/2018 15:28   Ct Angio Chest/abd/pel For Dissection W And/or Wo Contrast  Result Date: 02/08/2018 CLINICAL DATA:  Severe abdominal pain and back  pain EXAM: CT ANGIOGRAPHY CHEST, ABDOMEN AND PELVIS TECHNIQUE: Multidetector CT imaging through the chest, abdomen and pelvis was performed using the standard protocol during bolus administration of intravenous contrast. Multiplanar reconstructed images and MIPs were obtained and reviewed to evaluate the vascular anatomy. CONTRAST:  127mL ISOVUE-370 IOPAMIDOL (ISOVUE-370) INJECTION 76% COMPARISON:  08/21/2017.  05/10/2013. FINDINGS: CTA CHEST FINDINGS Cardiovascular: There is no evidence of acute intramural hematoma or aortic dissection. Maximal diameter of the ascending aorta is 3.4 cm. There is no obvious evidence of acute pulmonary thromboembolism. Great vessels are patent. Vertebral arteries are patent within the confines of the study. No significant coronary artery calcifications. Mediastinum/Nodes: No abnormal mediastinal adenopathy. No pericardial effusion. Visualized thyroid is unremarkable. Lungs/Pleura: No pneumothorax. No pleural effusion. Minimal scarring at the lung apices. Minimal dependent atelectasis in the lungs. Musculoskeletal: No vertebral compression deformity. No acute rib fracture. Review of the MIP images confirms the above findings. CTA ABDOMEN AND PELVIS FINDINGS VASCULAR Aorta: No evidence of aortic dissection or aneurysm. Aorta is patent. Mild atherosclerotic calcifications and minimal soft plaque in the lower abdominal aorta. 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 Branch vessels are patent. Accessory left hepatic artery anatomy. SMA: Patent. Renals: Single renal arteries are patent. IMA: Patent. Inflow: Bilateral common iliac, external iliac, and internal iliac arteries are patent. Review of the MIP images confirms the above findings. NON-VASCULAR Hepatobiliary: Heterogeneous enhancement likely due to arterial phase imaging. No focal mass. Gallbladder is within normal limits. Pancreas: There is stranding and blurring of the fat planes in the head of the  pancreas. There is heterogeneous enhancement in the head of the pancreas. These findings support acute pancreatitis. There is no evidence of pancreatic necrosis. There is stranding which extends from the head of the pancreas inferiorly, surrounding the duodenum, and into the retroperitoneum. Hounsfield unit measurements are elevated. These findings are worrisome for retroperitoneal hemorrhage. Soft tissue mass would be a secondary consideration. Spleen: Post splenectomy. Adrenals/Urinary Tract: Tiny calculus in the lower pole of the right kidney. Left kidney is unremarkable. Stable left adrenal nodule. Unremarkable bladder. Stomach/Bowel: There is severe wall thickening and edema of the duodenum, associated with inflammation of the pancreatic head. As described above, high density stranding around the duodenum is noted worrisome for retroperitoneal hemorrhage. No evidence of small-bowel obstruction. Normal appendix. No obvious mass in the colon. Lymphatic: No abnormal retroperitoneal adenopathy. Reproductive: Uterus is absent.  Adnexa are within normal limits. Other: No free fluid. Musculoskeletal: No vertebral compression deformity. Review of the MIP images confirms the above findings. IMPRESSION: Vascular: No evidence of aortic dissection. Nonvascular: There is retroperitoneal hemorrhage associated with the head of the pancreas and duodenum as described above. These may represent a complication of acute pancreatitis. There is no evidence of pancreatic necrosis. Alternatively, the soft tissue density may represent neoplasm rather than hemorrhage. Electronically  Signed   By: Marybelle Killings M.D.   On: 02/08/2018 16:06    Subjective: Abdominal pain significantly improved. No anemic symptoms. No bleeding. Eating well.   Discharge Exam: Vitals:   02/12/18 0300 02/12/18 0743  BP: 108/73 114/65  Pulse: 60 (!) 58  Resp:    Temp: 98 F (36.7 C) 98.1 F (36.7 C)  SpO2: 94% 94%   General: Pt is alert, awake,  not in acute distress Cardiovascular: RRR, S1/S2 +, no rubs, no gallops Respiratory: CTA bilaterally, no wheezing, no rhonchi Abdominal: Soft, minimal tenderness to deep palpation in epigastrium, much improved ND, bowel sounds + Extremities: No edema, no cyanosis  Labs: BNP (last 3 results) No results for input(s): BNP in the last 8760 hours. Basic Metabolic Panel: Recent Labs  Lab 02/08/18 1408 02/08/18 1904  NA 140  --   K 3.5  --   CL 110  --   CO2 20*  --   GLUCOSE 131*  --   BUN 16  --   CREATININE 0.60  --   CALCIUM 9.2  --   MG  --  1.7  PHOS  --  4.5   Liver Function Tests: Recent Labs  Lab 02/08/18 1408  AST 23  ALT 24  ALKPHOS 70  BILITOT 0.5  PROT 7.1  ALBUMIN 3.8   Recent Labs  Lab 02/08/18 1408  LIPASE 22   No results for input(s): AMMONIA in the last 168 hours. CBC: Recent Labs  Lab 02/08/18 1904 02/09/18 1643 02/10/18 0605 02/11/18 0343 02/12/18 0400  WBC 19.6* 15.9* 14.5* 14.8* 12.8*  NEUTROABS 15.5*  --   --   --   --   HGB 13.1 11.9* 11.6* 10.9* 11.6*  HCT 39.4 35.4* 35.2* 33.3* 34.6*  MCV 87.8 86.6 88.2 88.3 86.9  PLT 297 287 290 296 305   Cardiac Enzymes: Recent Labs  Lab 02/08/18 1431  TROPONINI <0.03   BNP: Invalid input(s): POCBNP CBG: Recent Labs  Lab 02/08/18 2145 02/09/18 0128  GLUCAP 142* 134*   D-Dimer No results for input(s): DDIMER in the last 72 hours. Hgb A1c No results for input(s): HGBA1C in the last 72 hours. Lipid Profile No results for input(s): CHOL, HDL, LDLCALC, TRIG, CHOLHDL, LDLDIRECT in the last 72 hours. Thyroid function studies No results for input(s): TSH, T4TOTAL, T3FREE, THYROIDAB in the last 72 hours.  Invalid input(s): FREET3 Anemia work up No results for input(s): VITAMINB12, FOLATE, FERRITIN, TIBC, IRON, RETICCTPCT in the last 72 hours. Urinalysis    Component Value Date/Time   COLORURINE YELLOW 02/08/2018 1600   APPEARANCEUR CLEAR 02/08/2018 1600   LABSPEC 1.031 (H) 02/08/2018  1600   PHURINE 5.0 02/08/2018 1600   GLUCOSEU NEGATIVE 02/08/2018 1600   HGBUR SMALL (A) 02/08/2018 1600   BILIRUBINUR NEGATIVE 02/08/2018 1600   KETONESUR NEGATIVE 02/08/2018 1600   PROTEINUR 30 (A) 02/08/2018 1600   UROBILINOGEN 0.2 05/16/2012 0213   NITRITE NEGATIVE 02/08/2018 1600   LEUKOCYTESUR NEGATIVE 02/08/2018 1600    Microbiology Recent Results (from the past 240 hour(s))  Surgical pcr screen     Status: None   Collection Time: 02/09/18  2:52 AM  Result Value Ref Range Status   MRSA, PCR NEGATIVE NEGATIVE Final   Staphylococcus aureus NEGATIVE NEGATIVE Final    Comment: (NOTE) The Xpert SA Assay (FDA approved for NASAL specimens in patients 78 years of age and older), is one component of a comprehensive surveillance program. It is not intended to diagnose infection nor to  guide or monitor treatment. Performed at [facility], [place] [address] [place], [place] [address]     Time coordinating discharge: Approximately 40 minutes  Patrecia Pour, MD  [facility] 02/12/2018, 10:23 AM Pager [phone]

## 2018-02-12 NOTE — Progress Notes (Signed)
    Subjective  -   Still complaining of abdominal pain.  She states that she has had abdominal pain and back pain for many years.  It is exacerbated by eating a lot.  She is not losing weight.  She does not have a fear food.   Physical Exam:  Abdomen is tender to palpation       Assessment/Plan:    Chronic abdominal pain: I have reviewed the patient's CT scan.  This certainly has the appearance of median arcuate ligament syndrome.  However, the patient states that her symptoms have been chronic however they are exacerbated with a retroperitoneal bleed.  She does not have a history of weight loss or food intolerance.  I discussed with the patient that I would not recommend urgent intervention for possible median arcuate ligament syndrome.  We can discuss this further as an outpatient.  She will likely need angiography and ultrasound evaluation with inspiratory and expiratory imaging.  Jacqueline Haney 02/12/2018 1:15 AM --  Vitals:   02/11/18 1900 02/11/18 2313  BP: 118/74 110/62  Pulse: 70   Resp:  18  Temp: 98.2 F (36.8 C) 98.3 F (36.8 C)  SpO2: 95% 96%    Intake/Output Summary (Last 24 hours) at 02/12/2018 0115 Last data filed at 02/11/2018 1800 Gross per 24 hour  Intake 1560 ml  Output -  Net 1560 ml     Laboratory CBC    Component Value Date/Time   WBC 14.8 (H) 02/11/2018 0343   HGB 10.9 (L) 02/11/2018 0343   HCT 33.3 (L) 02/11/2018 0343   PLT 296 02/11/2018 0343    BMET    Component Value Date/Time   NA 140 02/08/2018 1408   K 3.5 02/08/2018 1408   CL 110 02/08/2018 1408   CO2 20 (L) 02/08/2018 1408   GLUCOSE 131 (H) 02/08/2018 1408   BUN 16 02/08/2018 1408   CREATININE 0.60 02/08/2018 1408   CALCIUM 9.2 02/08/2018 1408   GFRNONAA >60 02/08/2018 1408   GFRAA >60 02/08/2018 1408    COAG Lab Results  Component Value Date   INR 1.00 02/08/2018   INR 0.97 04/28/2010   No results found for: PTT  Antibiotics Anti-infectives (From admission,  onward)   Start     Dose/Rate Route Frequency Ordered Stop   02/09/18 0600  piperacillin-tazobactam (ZOSYN) IVPB 3.375 g     3.375 g 12.5 mL/hr over 240 Minutes Intravenous Every 8 hours 02/09/18 0241     02/09/18 0115  piperacillin-tazobactam (ZOSYN) IVPB 3.375 g  Status:  Discontinued     3.375 g 100 mL/hr over 30 Minutes Intravenous Every 8 hours 02/09/18 0101 02/09/18 0240   02/08/18 1630  piperacillin-tazobactam (ZOSYN) IVPB 3.375 g     3.375 g 100 mL/hr over 30 Minutes Intravenous  Once 02/08/18 1618 02/08/18 1655       V. Leia Alf, M.D. [facility] Office: [phone] Pager:  [phone]

## 2018-02-12 NOTE — Discharge Instructions (Signed)
You must take protonix daily

## 2018-02-13 ENCOUNTER — Telehealth: Payer: Self-pay | Admitting: Surgery

## 2018-02-13 NOTE — Telephone Encounter (Signed)
sch app lvm 03/17/18 9am [facility] 03/20/18 9am f/u MD

## 2018-02-15 ENCOUNTER — Other Ambulatory Visit: Payer: Self-pay

## 2018-02-15 DIAGNOSIS — R1013 Epigastric pain: Secondary | ICD-10-CM

## 2018-02-15 DIAGNOSIS — R933 Abnormal findings on diagnostic imaging of other parts of digestive tract: Secondary | ICD-10-CM

## 2018-02-17 NOTE — Progress Notes (Signed)
Call patient and tell her duodenal biopsies did not show any problems -  She needs to keep vascular surgery  f/u  I am waiting to hear from them re: GI follow-up plans - she will need repeat CT scan or an MRI I suspect

## 2018-02-22 ENCOUNTER — Other Ambulatory Visit: Payer: Self-pay

## 2018-02-22 ENCOUNTER — Emergency Department (HOSPITAL_COMMUNITY)
Admission: EM | Admit: 2018-02-22 | Discharge: 2018-02-22 | Disposition: A | Discharge status: Home / Self Care | Payer: BC Managed Care – PPO | Attending: Emergency Medicine | Admitting: Emergency Medicine

## 2018-02-22 ENCOUNTER — Encounter (HOSPITAL_COMMUNITY): Payer: Self-pay | Admitting: Emergency Medicine

## 2018-02-22 ENCOUNTER — Emergency Department (HOSPITAL_COMMUNITY): Payer: BC Managed Care – PPO

## 2018-02-22 DIAGNOSIS — R1013 Epigastric pain: Secondary | ICD-10-CM

## 2018-02-22 DIAGNOSIS — R112 Nausea with vomiting, unspecified: Secondary | ICD-10-CM | POA: Diagnosis present

## 2018-02-22 DIAGNOSIS — F17210 Nicotine dependence, cigarettes, uncomplicated: Secondary | ICD-10-CM | POA: Insufficient documentation

## 2018-02-22 LAB — COMPREHENSIVE METABOLIC PANEL
ALBUMIN: 4.2 g/dL (ref 3.5–5.0)
ALT: 22 U/L (ref 0–44)
AST: 22 U/L (ref 15–41)
Alkaline Phosphatase: 93 U/L (ref 38–126)
Anion gap: 10 (ref 5–15)
BUN: 13 mg/dL (ref 8–23)
CHLORIDE: 103 mmol/L (ref 98–111)
CO2: 27 mmol/L (ref 22–32)
Calcium: 9.5 mg/dL (ref 8.9–10.3)
Creatinine, Ser: 0.68 mg/dL (ref 0.44–1.00)
GFR calc Af Amer: >60 mL/min (ref >60)
GFR calc non Af Amer: >60 mL/min (ref >60)
GLUCOSE: 108 mg/dL — AB (ref 70–99)
Potassium: 3.1 mmol/L — ABNORMAL LOW (ref 3.5–5.1)
SODIUM: 140 mmol/L (ref 135–145)
Total Bilirubin: 0.6 mg/dL (ref 0.3–1.2)
Total Protein: 8.3 g/dL — ABNORMAL HIGH (ref 6.5–8.1)

## 2018-02-22 LAB — URINALYSIS, ROUTINE W REFLEX MICROSCOPIC
BACTERIA UA: NONE SEEN
Bilirubin Urine: NEGATIVE
Glucose, UA: NEGATIVE mg/dL
HGB URINE DIPSTICK: NEGATIVE
Ketones, ur: NEGATIVE mg/dL
Leukocytes, UA: NEGATIVE
NITRITE: NEGATIVE
PROTEIN: 100 mg/dL — AB
Specific Gravity, Urine: 1.014 (ref 1.005–1.030)
pH: 5 (ref 5.0–8.0)

## 2018-02-22 LAB — CBC WITH DIFFERENTIAL/PLATELET
BASOS ABS: 0 10*3/uL (ref 0.0–0.1)
Basophils Relative: 0 %
EOS PCT: 1 %
Eosinophils Absolute: 0.1 10*3/uL (ref 0.0–0.7)
HCT: 41.2 % (ref 36.0–46.0)
Hemoglobin: 14.2 g/dL (ref 12.0–15.0)
Lymphocytes Relative: 27 %
Lymphs Abs: 4.3 10*3/uL — ABNORMAL HIGH (ref 0.7–4.0)
MCH: 30.1 pg (ref 26.0–34.0)
MCHC: 34.5 g/dL (ref 30.0–36.0)
MCV: 87.5 fL (ref 78.0–100.0)
Monocytes Absolute: 1 10*3/uL (ref 0.1–1.0)
Monocytes Relative: 6 %
Neutro Abs: 10.6 10*3/uL — ABNORMAL HIGH (ref 1.7–7.7)
Neutrophils Relative %: 66 %
PLATELETS: 550 10*3/uL — AB (ref 150–400)
RBC: 4.71 MIL/uL (ref 3.87–5.11)
RDW: 13.4 % (ref 11.5–15.5)
WBC: 16 10*3/uL — AB (ref 4.0–10.5)

## 2018-02-22 LAB — LIPASE, BLOOD: LIPASE: 27 U/L (ref 11–51)

## 2018-02-22 MED ORDER — IOPAMIDOL (ISOVUE-370) INJECTION 76%
100.0000 mL | Freq: Once | INTRAVENOUS | Status: AC | PRN
  Administered 2018-02-22: 100 mL via INTRAVENOUS

## 2018-02-22 MED ORDER — HYDROMORPHONE HCL 1 MG/ML IJ SOLN
0.5000 mg | Freq: Once | INTRAMUSCULAR | Status: AC
  Administered 2018-02-22: 0.5 mg via INTRAVENOUS
  Filled 2018-02-22: qty 1

## 2018-02-22 MED ORDER — PROMETHAZINE HCL 25 MG/ML IJ SOLN
12.5000 mg | Freq: Once | INTRAMUSCULAR | Status: AC
  Administered 2018-02-22: 12.5 mg via INTRAVENOUS
  Filled 2018-02-22: qty 1

## 2018-02-22 MED ORDER — PROMETHAZINE HCL 25 MG PO TABS: 25.0000 mg | ORAL_TABLET | Freq: Four times a day (QID) | ORAL | 0 refills | Status: DC | PRN

## 2018-02-22 MED ORDER — SODIUM CHLORIDE 0.9 % IV SOLN
Freq: Once | INTRAVENOUS | Status: AC
  Administered 2018-02-22: 13:00:00 via INTRAVENOUS

## 2018-02-22 NOTE — ED Provider Notes (Signed)
[facility] Provider Note   CSN: [id number] Arrival date & time: 02/22/18  1048     History   Chief Complaint Chief Complaint  Patient presents with  . Abdominal Pain    HPI Jacqueline Haney is a 62 y.o. female.   62 y/o female with no PMH presents to the ED complaining of vomiting, nausea and abdominal pain.Patient was recently discharged from the hospital for the same symptoms. Patient states her vomiting began Sunday and got worst last night. She was seen by her PCP yesterday and prescribed Zofran, which she took with no relief in symptoms. Patient states she has not been able to have any food or liquids without vomiting. Her pain is mostly int he epigastric region and flanks, she states she gets relieve when standing up. She denies any fever, urinary symptoms, shortness of breath or chest pain.   Patient was recently discharged from the hospital after seen in the ED for the same symptoms. An EGD and pancreatic biopsy were done in hospital. EGD 6/28 showed severe duodenitis, biopsies taken, but no ulcer noted. Discharge summary stated Consideration for median arcuate ligament syndrome, so vascular surgery was consulted. Recommendation was to advance diet, continue avoiding NSAIDs, and repeat CT abdomen possibly with angiography as an outpatient. Hemoglobin is stable with no evidence of ongoing bleeding.      Past Medical History:  Diagnosis Date  . Chest pain    ACS rules out stress test negative  . Hyperlipidemia    refuses meds/diet controlled  . Migraine     Patient Active Problem List   Diagnosis Date Noted  . Duodenitis   . Abdominal pain, epigastric   . Abnormal CT scan, gastrointestinal tract   . Excessive use of nonsteroidal anti-inflammatory drugs (NSAIDs)   . Retroperitoneal bleed 02/08/2018  . Family history of coronary artery disease 09/18/2014  . Chest pain at rest 09/17/2014  . Obesity 09/17/2014  . Tobacco abuse 09/17/2014  . Chest  pain 09/17/2014  . DERANGEMENT MENISCUS 09/16/2009  . KNEE PAIN 09/16/2009  . SHOULDER PAIN 01/10/2008  . CERVICAL RADICULITIS 01/10/2008  . CERVICAL SPASM 01/10/2008    Past Surgical History:  Procedure Laterality Date  . ABDOMINAL HYSTERECTOMY    . BIOPSY  02/10/2018   Procedure: BIOPSY;  Surgeon: Gatha Mayer, MD;  Location: [facility];  Service: Endoscopy;;  . ESOPHAGOGASTRODUODENOSCOPY (EGD) WITH PROPOFOL N/A 02/10/2018   Procedure: ESOPHAGOGASTRODUODENOSCOPY (EGD) WITH PROPOFOL;  Surgeon: Gatha Mayer, MD;  Location: [facility];  Service: Endoscopy;  Laterality: N/A;  . SPLENECTOMY       OB History    Gravida  2   Para  2   Term  2   Preterm      AB      Living        SAB      TAB      Ectopic      Multiple      Live Births               Home Medications    Prior to Admission medications   Medication Sig Start Date End Date Taking? Authorizing Provider  ondansetron (ZOFRAN-ODT) 4 MG disintegrating tablet Take 1 tablet by mouth every 8 (eight) hours as needed. 02/21/18  Yes [provider]  pantoprazole (PROTONIX) 40 MG tablet Take 1 tablet (40 mg total) by mouth daily. 02/12/18  Yes Patrecia Pour, MD  SUMAtriptan (IMITREX) 50 MG tablet Take 50  mg by mouth every 2 (two) hours as needed for migraine or headache.  09/16/14  Yes [provider]  promethazine (PHENERGAN) 25 MG tablet Take 1 tablet (25 mg total) by mouth every 6 (six) hours as needed for up to 10 days for nausea or vomiting. 02/22/18 03/04/18  Janeece Fitting, PA-C    Family History Family History  Problem Relation Age of Onset  . Cancer Mother   . Cancer Father   . Kidney failure Sister   . Heart attack Sister   . Cancer Brother   . Cancer Other     Social History Social History   Tobacco Use  . Smoking status: Current Every Day Smoker    Packs/day: 0.50    Types: Cigarettes  . Smokeless tobacco: Never Used  Substance Use Topics  . Alcohol use: No  . Drug  use: No     Allergies   Aspirin; Codeine; Hydrocodone; Oxycodone-acetaminophen; and Tape   Review of Systems Review of Systems  Respiratory: Negative for chest tightness and shortness of breath.   Cardiovascular: Negative for chest pain and palpitations.  Gastrointestinal: Positive for abdominal pain, diarrhea, nausea and vomiting. Negative for constipation.  Genitourinary: Positive for flank pain. Negative for dysuria and pelvic pain.  Musculoskeletal: Negative for back pain.  Neurological: Negative for light-headedness and headaches.  All other systems reviewed and are negative.    Physical Exam Updated Vital Signs BP 121/71   Pulse 63   Temp 97.7 F (36.5 C) (Oral)   Resp 13   Ht 5\' 6"  (1.676 m)   Wt 78.5 kg (173 lb)   SpO2 92%   BMI 27.92 kg/m   Physical Exam  Constitutional: She appears well-developed.  Nursing note and vitals reviewed.    ED Treatments / Results  Labs (all labs ordered are listed, but only abnormal results are displayed) Labs Reviewed  COMPREHENSIVE METABOLIC PANEL - Abnormal; Notable for the following components:      Result Value   Potassium 3.1 (*)    Glucose, Bld 108 (*)    Total Protein 8.3 (*)    All other components within normal limits  URINALYSIS, ROUTINE W REFLEX MICROSCOPIC - Abnormal; Notable for the following components:   Protein, ur 100 (*)    All other components within normal limits  CBC WITH DIFFERENTIAL/PLATELET - Abnormal; Notable for the following components:   WBC 16.0 (*)    Platelets 550 (*)    Neutro Abs 10.6 (*)    Lymphs Abs 4.3 (*)    All other components within normal limits  LIPASE, BLOOD    EKG EKG Interpretation  Date/Time:  Wednesday February 22 2018 17:26:53 EDT Ventricular Rate:  55 PR Interval:    QRS Duration: 84 QT Interval:  434 QTC Calculation: 416 R Axis:   -44 Text Interpretation:  Sinus rhythm Inferior infarct, old Consider anterior infarct unchanged EKG Confirmed by Noemi Chapel  [phone]) on 02/22/2018 5:53:40 PM   Radiology Ct Angio Abd/pel W And/or Wo Contrast  Result Date: 02/22/2018 CLINICAL DATA:  Abdominal pain EXAM: CTA ABDOMEN AND PELVIS wITHOUT AND WITH CONTRAST TECHNIQUE: Multidetector CT imaging of the abdomen and pelvis was performed using the standard protocol during bolus administration of intravenous contrast. Multiplanar reconstructed images and MIPs were obtained and reviewed to evaluate the vascular anatomy. CONTRAST:  191mL ISOVUE-370 IOPAMIDOL (ISOVUE-370) INJECTION 76% COMPARISON:  02/08/2018 FINDINGS: VASCULAR Aorta: None aneurysmal and patent. Celiac: There is narrowing at the origin secondary to median arcuate ligament syndrome.  SMA: Patent. Pancreaticoduodenal branches extending through the region of the pancreatic head have an abnormal appearance. There are areas of dilatation separated by narrowing. The vessel has a somewhat abnormal appearance. See image 117 of series 7. This finding may related to vasculitis. Given the history of hemorrhage, this small vessel may have recently bled but there is no extravasation of contrast to suggest active bleeding. Renals: There is a beaded appearance in the right renal artery compatible with fibromuscular dysplasia. This is better visualized than on the prior study. The left renal artery is unremarkable and patent. IMA: Patent. Inflow: Mild atherosclerotic changes in the bilateral common iliac arteries characterized by calcification and smooth plaque. Internal and external iliac arteries are patent. Proximal Outflow: Grossly patent. Veins: No evidence of DVT. Review of the MIP images confirms the above findings. NON-VASCULAR Lower chest: Dependent atelectasis. Hepatobiliary: Liver and gallbladder are within normal limits. Pancreas: The pancreatic body and tail are within normal limits. There is still soft tissue density surrounding the head and second portion of the duodenum. The abnormal ill-defined soft tissue density  and stranding in the retroperitoneal fat has improved. These findings suggest hemorrhage about the pancreatic head and duodenum given the improvement over a short period of time. There is no free intraperitoneal gas to suggest perforation of bowel. Spleen: Post splenectomy Adrenals/Urinary Tract: Small hypodensity in the left kidney is likely a cyst. Kidneys are otherwise within normal limits allowing for contrast excretion into the collecting system. Stomach/Bowel: Mild distention of the stomach. Wall thickening and soft tissue stranding about the second portion of the duodenum has improved. Normal appendix. No evidence of small-bowel obstruction. No obvious mass in the colon. Lymphatic: No abnormal retroperitoneal adenopathy. Reproductive: Uterus is absent.  Adnexa are within normal limits. Other: No free fluid. Musculoskeletal: No vertebral compression deformity. IMPRESSION: VASCULAR There are abnormal pancreatic duodenal branches within the head of the pancreas. Given the recent finding of pancreatic hemorrhage, bleeding from this vessel may have occurred but there is no evidence of active contrast extravasation to suggest active bleeding. Differential diagnosis for abnormal vessels in this region include vasculitis, and increased flow due to collateralization from median arcuate ligament syndrome. 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 This is better demonstrated on today's study compared with the recent study. Correlate with a history of uncontrolled hypertension. NON-VASCULAR Soft tissue density and stranding involving the head of the pancreas and duodenum have improved. These findings suggest hemorrhage rather than neoplasm. Continued follow-up until complete resolution is recommended. Electronically Signed   By: Marybelle Killings M.D.   On: 02/22/2018 16:36    Procedures Procedures (including critical care time)  Medications Ordered in ED Medications  0.9 %   sodium chloride infusion ( Intravenous New Bag/Given 02/22/18 1310)  HYDROmorphone (DILAUDID) injection 0.5 mg (0.5 mg Intravenous Given 02/22/18 1310)  promethazine (PHENERGAN) injection 12.5 mg (12.5 mg Intravenous Given 02/22/18 1310)  iopamidol (ISOVUE-370) 76 % injection 100 mL (100 mLs Intravenous Contrast Given 02/22/18 1536)     Initial Impression / Assessment and Plan / ED Course  I have reviewed the triage vital signs and the nursing notes.  Pertinent labs & imaging results that were available during my care of the patient were reviewed by me and considered in my medical decision making (see chart for details).     Patient seen in the ED with N/V and abdominal pain. Patient was seen recently for the same complaint and diagnosed with median arcuate ligament syndrome. Patient  states her PCP  Prescribed Zofran with no relieve in symptoms.  Dr. Minta Balsam spoke to Vascular surgeon on repeat CTa (please see note).  CTa shows no retroperitoneal bleed at this point.Patient has been advised to follow up with Vascular surgeon Dr. Chaya Jan. I will provide patient with nausea medication to go home with.I have spoken to her and told her I am unable to give her pain medication at this time because she is allergic to medication. Patient understands and agrees she need outpatient follow up.  Final Clinical Impressions(s) / ED Diagnoses   Final diagnoses:  Epigastric pain  Nausea and vomiting, intractability of vomiting not specified, unspecified vomiting type    ED Discharge Orders        Ordered    promethazine (PHENERGAN) 25 MG tablet  Every 6 hours PRN     02/22/18 1753       Janeece Fitting, PA-C 02/22/18 1812    Noemi Chapel, MD 02/24/18 [phone]

## 2018-02-22 NOTE — Discharge Instructions (Addendum)
I have prescribed medication for nausea, please take as directed and needed. Please schedule an appointment with vascular surgeon Dr. Chaya Jan in 1 week for follow up care. Please return to the ED if symptoms worsen of you experience any chest pain or shortness of breath.

## 2018-02-22 NOTE — ED Provider Notes (Signed)
Medical screening examination/treatment/procedure(s) were conducted as a shared visit with non-physician practitioner(s) and myself.  I personally evaluated the patient during the encounter.  None  61yF with abdominal pain and n/v. Pt is actually known to me from seeing her in the ED initially on 02/08/18. She was transferred to [facility] after CT showed retroperitoneal hemorrhage vs mass near pancreas. CT also significant for compression of celiac artery with consideration of median arcuate ligament syndrome. Lipase was normal. She was seen by GI and had EGD showing severe duodenitis, no ulcerations. Biopsies were fine. She was discharged on 6/30 feeling better.   She has been taking meds as prescribed and avoiding all NSAIDs. Three days ago she began having persistent n/v and abdominal pain. Pain is in epigastrium and consistently has it after eating/drinking anything but will also have paroxysms of pain and nausea without appreciable exacerbating factors. She is usually able to improve the symptoms with changes in position, either curling up into a fetal position and sometimes when standing. She saw her PCP yesterday who prescribed her zofran which he has been taking without improvement.  She has had chronic abdominal pain for years after large meals but her more acute symptoms were in association with this bleed. In the past several days she has seemingly noticed a correlation with eating. Obscuring the clinical picture is this recent bleed. Case peripherally discussed with Dr Trula Slade via [facility] staff. Recommending CTa again today with her history of hemorrhage. Her symptoms at the time he evaluated her weren't very typical of celiac artery compression.   To me, her more recent symptoms seem to be. This is admittedly not a condition I am very familiar with though and I may be trying to make her symptoms fit this diagnosis. There also doesn't appear to be a clear etiology of her retroperitoneal bleed at this  point. Possibly splanchnic artery aneurysm rupture related to celiac compression? Not noted on prior CTa. I'm not sure if traditional arteriography would be more sensitive for this or not. Regardless, plan is for repeat CT today. If doesn't show any new acute findings then follow-up with vascular surgery as previously planned. Would prescribe pain meds and could try alternative to zofran for symptomatic control.          Virgel Manifold, MD 02/22/18 [phone]

## 2018-02-22 NOTE — ED Triage Notes (Signed)
Abdominal pain since Sunday, Pt was admitting last week for the same, biopsy done, negative for pancreatic cancer. Given zofran from pcp yesterday, continues to vomit without relief.

## 2018-03-17 ENCOUNTER — Ambulatory Visit (HOSPITAL_COMMUNITY)
Admission: RE | Admit: 2018-03-17 | Discharge: 2018-03-17 | Disposition: A | Discharge status: Home / Self Care | Payer: BC Managed Care – PPO | Source: Ambulatory Visit | Attending: Family | Admitting: Family

## 2018-03-17 DIAGNOSIS — R933 Abnormal findings on diagnostic imaging of other parts of digestive tract: Secondary | ICD-10-CM

## 2018-03-17 DIAGNOSIS — R1013 Epigastric pain: Secondary | ICD-10-CM | POA: Insufficient documentation

## 2018-03-20 ENCOUNTER — Ambulatory Visit (INDEPENDENT_AMBULATORY_CARE_PROVIDER_SITE_OTHER): Payer: BC Managed Care – PPO | Admitting: Surgery

## 2018-03-20 ENCOUNTER — Encounter: Payer: Self-pay | Admitting: Surgery

## 2018-03-20 ENCOUNTER — Other Ambulatory Visit: Payer: Self-pay

## 2018-03-20 VITALS — BP 123/85 | HR 66 | Temp 97.2°F | Resp 16 | Ht 66.0 in | Wt 171.0 lb

## 2018-03-20 DIAGNOSIS — I774 Celiac artery compression syndrome: Secondary | ICD-10-CM

## 2018-03-20 NOTE — Progress Notes (Signed)
[facility]  Patient name: Jacqueline Haney MRN: [id number] DOB: 05/24/1956 Sex: female   REASON FOR VISIT:    Follow up  HISOTRY OF PRESENT ILLNESS:    Jacqueline Haney is a 62 y.o. female who is here today for follow-up of abdominal pain.  She was admitted to the hospital in June 2019 for abdominal pain and nausea.  She had a CT scan that suggested median arcuate ligament syndrome.  She also had a retroperitoneal hemorrhage versus a mass near her pancreas.  She states that she has had postprandial abdominal pain periodically for the past 10 years.  She has not had any weight loss.  She has undergone upper endoscopy which did not identify any etiology to her epigastric pain.  Currently, she is without abdominal pain.  She still will get pain if she overeats.  If she does not overeat, she does not have pain.  She is gaining weight.  The patient is a current smoker.  She has a history of hypercholesterolemia but does not want to take medication.  She also suffers from migraines.  She has a history of chest pain but with negative stress test.    PAST MEDICAL HISTORY:   Past Medical History:  Diagnosis Date  . Chest pain    ACS rules out stress test negative  . Hyperlipidemia    refuses meds/diet controlled  . Migraine      FAMILY HISTORY:   Family History  Problem Relation Age of Onset  . Cancer Mother   . Cancer Father   . Kidney failure Sister   . Heart attack Sister   . Cancer Brother   . Cancer Other     SOCIAL HISTORY:   Social History   Tobacco Use  . Smoking status: Current Every Day Smoker    Packs/day: 0.50    Types: Cigarettes  . Smokeless tobacco: Never Used  Substance Use Topics  . Alcohol use: No     ALLERGIES:   Allergies  Allergen Reactions  . Aspirin Nausea And Vomiting  . Codeine Other (See Comments)    Alters mental status "climbs walls/feels high"  . Hydrocodone Other  (See Comments)    Alters mental status "climbs walls/feels high"  . Oxycodone-Acetaminophen Other (See Comments)    Alters mental status "climbing a while/high"  . Tape Dermatitis     CURRENT MEDICATIONS:   Current Outpatient Medications  Medication Sig Dispense Refill  . ondansetron (ZOFRAN-ODT) 4 MG disintegrating tablet Take 1 tablet by mouth every 8 (eight) hours as needed.  1  . pantoprazole (PROTONIX) 40 MG tablet Take 1 tablet (40 mg total) by mouth daily. 30 tablet 0  . SUMAtriptan (IMITREX) 50 MG tablet Take 50 mg by mouth every 2 (two) hours as needed for migraine or headache.     . promethazine (PHENERGAN) 25 MG tablet Take 1 tablet (25 mg total) by mouth every 6 (six) hours as needed for up to 10 days for nausea or vomiting. 30 tablet 0   No current facility-administered medications for this visit.     REVIEW OF SYSTEMS:   [X]  denotes positive finding, [ ]  denotes negative finding Cardiac  Comments:  Chest pain or chest pressure:    Shortness of breath upon exertion:    Short of breath when lying flat:    Irregular heart rhythm:        Vascular    Pain in calf, thigh, or hip brought on by  ambulation:    Pain in feet at night that wakes you up from your sleep:     Blood clot in your veins:    Leg swelling:         Pulmonary    Oxygen at home:    Productive cough:     Wheezing:         Neurologic    Sudden weakness in arms or legs:     Sudden numbness in arms or legs:     Sudden onset of difficulty speaking or slurred speech:    Temporary loss of vision in one eye:     Problems with dizziness:         Gastrointestinal    Blood in stool:     Vomited blood:         Genitourinary    Burning when urinating:     Blood in urine:        Psychiatric    Major depression:         Hematologic    Bleeding problems:    Problems with blood clotting too easily:        Skin    Rashes or ulcers:        Constitutional    Fever or chills:      PHYSICAL  EXAM:   There were no vitals filed for this visit.  GENERAL: The patient is a well-nourished female, in no acute distress. The vital signs are documented above. CARDIAC: There is a regular rate and rhythm.  VASCULAR: No carotid bruits PULMONARY: Non-labored respirations ABDOMEN: Soft and non-tender with normal pitched bowel sounds.  MUSCULOSKELETAL: There are no major deformities or cyanosis. NEUROLOGIC: No focal weakness or paresthesias are detected. SKIN: There are no ulcers or rashes noted. PSYCHIATRIC: The patient has a normal affect.  STUDIES:   CT angiogram: There are abnormal pancreatic duodenal branches within the head of the pancreas. Given the recent finding of pancreatic hemorrhage, bleeding from this vessel may have occurred but there is no evidence of active contrast extravasation to suggest active bleeding. Differential diagnosis for abnormal vessels in this region include vasculitis, and increased flow due to collateralization from median arcuate ligament syndrome.  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 This is better demonstrated on today's study compared with the recent study. Correlate with a history of uncontrolled hypertension.  NON-VASCULAR  Soft tissue density and stranding involving the head of the pancreas and duodenum have improved. These findings suggest hemorrhage rather than neoplasm. Continued follow-up until complete resolution is recommended.  Mesenteric duplex: Mesenteric     PSV cm/sEDV cm/sPlaque +--------------------+--------+--------+------+ Aorta Prox       69          +--------------------+--------+--------+------+ Celiac Artery Origin 115          +--------------------+--------+--------+------+ SMA Origin      137          +--------------------+--------+--------+------+ SMA Proximal     137           +--------------------+--------+--------+------+ SMA Mid        174          +--------------------+--------+--------+------+ IMA          140          +--------------------+--------+--------+------+  Mesenteric Technologist observations: Celiac axis inspiration 115 Celiac axis expiration 236  FINAL INTERPRETATION: Mesenteric: Normal Inferior Mesenteric artery and Superior Mesenteric artery findings. Velocity increase of 121 cm/s noted at end expiration suggestive of median  arcuate ligament syndrome.  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  The patient has been having intermittent symptoms for 10 years.  She seems to be able to tolerate her symptoms.  She is not interested in a operation to correct this.  She will contact me if her symptoms progress.  Otherwise I will see her back on an as-needed basis.    Annamarie Major, MD [facility] [phone] Pager [phone]

## 2018-03-27 ENCOUNTER — Other Ambulatory Visit (HOSPITAL_COMMUNITY): Payer: Self-pay | Admitting: Family Medicine

## 2018-03-27 DIAGNOSIS — Z1231 Encounter for screening mammogram for malignant neoplasm of breast: Secondary | ICD-10-CM

## 2018-03-30 ENCOUNTER — Ambulatory Visit (HOSPITAL_COMMUNITY)
Admission: RE | Admit: 2018-03-30 | Discharge: 2018-03-30 | Disposition: A | Discharge status: Home / Self Care | Payer: BC Managed Care – PPO | Source: Ambulatory Visit | Attending: Family Medicine | Admitting: Family Medicine

## 2018-03-30 DIAGNOSIS — Z1231 Encounter for screening mammogram for malignant neoplasm of breast: Secondary | ICD-10-CM | POA: Insufficient documentation

## 2018-04-18 ENCOUNTER — Encounter: Payer: Self-pay | Admitting: Internal Medicine

## 2018-05-27 ENCOUNTER — Other Ambulatory Visit: Payer: Self-pay

## 2018-05-27 ENCOUNTER — Emergency Department (HOSPITAL_COMMUNITY)
Admission: EM | Admit: 2018-05-27 | Discharge: 2018-05-27 | Disposition: A | Discharge status: Home / Self Care | Payer: BC Managed Care – PPO | Attending: Emergency Medicine | Admitting: Emergency Medicine

## 2018-05-27 ENCOUNTER — Encounter (HOSPITAL_COMMUNITY): Payer: Self-pay | Admitting: Emergency Medicine

## 2018-05-27 DIAGNOSIS — S60454A Superficial foreign body of right ring finger, initial encounter: Secondary | ICD-10-CM | POA: Diagnosis present

## 2018-05-27 DIAGNOSIS — Y998 Other external cause status: Secondary | ICD-10-CM | POA: Diagnosis not present

## 2018-05-27 DIAGNOSIS — Y929 Unspecified place or not applicable: Secondary | ICD-10-CM | POA: Insufficient documentation

## 2018-05-27 DIAGNOSIS — Z859 Personal history of malignant neoplasm, unspecified: Secondary | ICD-10-CM | POA: Diagnosis not present

## 2018-05-27 DIAGNOSIS — S6991XA Unspecified injury of right wrist, hand and finger(s), initial encounter: Secondary | ICD-10-CM

## 2018-05-27 DIAGNOSIS — W458XXA Other foreign body or object entering through skin, initial encounter: Secondary | ICD-10-CM | POA: Diagnosis not present

## 2018-05-27 DIAGNOSIS — F17210 Nicotine dependence, cigarettes, uncomplicated: Secondary | ICD-10-CM | POA: Insufficient documentation

## 2018-05-27 DIAGNOSIS — Y9389 Activity, other specified: Secondary | ICD-10-CM | POA: Diagnosis not present

## 2018-05-27 DIAGNOSIS — Z23 Encounter for immunization: Secondary | ICD-10-CM | POA: Insufficient documentation

## 2018-05-27 DIAGNOSIS — Z79899 Other long term (current) drug therapy: Secondary | ICD-10-CM | POA: Insufficient documentation

## 2018-05-27 HISTORY — DX: Malignant (primary) neoplasm, unspecified: C80.1

## 2018-05-27 MED ORDER — LIDOCAINE HCL (PF) 2 % IJ SOLN
INTRAMUSCULAR | Status: AC
  Filled 2018-05-27: qty 20

## 2018-05-27 MED ORDER — LIDOCAINE HCL (PF) 2 % IJ SOLN
10.0000 mL | Freq: Once | INTRAMUSCULAR | Status: AC
  Administered 2018-05-27: 10 mL

## 2018-05-27 MED ORDER — TETANUS-DIPHTH-ACELL PERTUSSIS 5-2.5-18.5 LF-MCG/0.5 IM SUSP
0.5000 mL | Freq: Once | INTRAMUSCULAR | Status: AC
  Administered 2018-05-27: 0.5 mL via INTRAMUSCULAR
  Filled 2018-05-27: qty 0.5

## 2018-05-27 NOTE — ED Provider Notes (Signed)
[facility] EMERGENCY DEPARTMENT Provider Note   CSN: [id number] Arrival date & time: 05/27/18  1324     History   Chief Complaint Chief Complaint  Patient presents with  . Foreign Body    HPI Jacqueline Haney is a 62 y.o. female.  The history is provided by the patient. No language interpreter was used.  Foreign Body  The current episode started 1 to 2 hours ago. Suspected object: fishhhok. The incident was witnessed/reported by the patient.  Pt reports she was cleaning a bag and caught finger on a fishing hook  Past Medical History:  Diagnosis Date  . Cancer ([place])   . Chest pain    ACS rules out stress test negative  . Hyperlipidemia    refuses meds/diet controlled  . 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 Migraine     Patient Active Problem List   Diagnosis Date Noted  . Duodenitis   . Abdominal pain, epigastric   . 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 Excessive use of nonsteroidal anti-inflammatory drugs (NSAIDs)   . Retroperitoneal bleed 02/08/2018  . Family history of coronary artery disease 09/18/2014  . Chest pain at rest 09/17/2014  . Obesity 09/17/2014  . Tobacco abuse 09/17/2014  . Chest pain 09/17/2014  . DERANGEMENT MENISCUS 09/16/2009  . KNEE PAIN 09/16/2009  . SHOULDER PAIN 01/10/2008  . CERVICAL RADICULITIS 01/10/2008  . CERVICAL SPASM 01/10/2008    Past Surgical History:  Procedure Laterality Date  . ABDOMINAL HYSTERECTOMY    . BIOPSY  02/10/2018   Procedure: BIOPSY;  Surgeon: Gatha Mayer, MD;  Location: [place];  Service: Endoscopy;;  . ESOPHAGOGASTRODUODENOSCOPY (EGD) WITH PROPOFOL N/A 02/10/2018   Procedure: ESOPHAGOGASTRODUODENOSCOPY (EGD) WITH PROPOFOL;  Surgeon: Gatha Mayer, MD;  Location: [place];  Service: Endoscopy;  Laterality: N/A;  . SPLENECTOMY       OB History    Gravida  2   Para  2   Term  2   Preterm      AB      Living        SAB      TAB      Ectopic      Multiple      Live Births               Home Medications    Prior to Admission medications   Medication Sig Start Date End Date Taking? Authorizing Provider  ondansetron (ZOFRAN-ODT) 4 MG disintegrating tablet Take 1 tablet by mouth every 8 (eight) hours as needed. 02/21/18   [provider]  pantoprazole (PROTONIX) 40 MG tablet Take 1 tablet (40 mg total) by mouth daily. 02/12/18   Patrecia Pour, MD  promethazine (PHENERGAN) 25 MG tablet Take 1 tablet (25 mg total) by mouth every 6 (six) hours as needed for up to 10 days for nausea or vomiting. 02/22/18 03/04/18  Janeece Fitting, PA-C  SUMAtriptan (IMITREX) 50 MG tablet Take 50 mg by mouth every 2 (two) hours as needed for migraine or headache.  09/16/14   [provider]    Family History Family History  Problem Relation Age of Onset  . Cancer Mother   . Cancer Father   . Kidney failure Sister   . Heart attack Sister   . Cancer Brother   . Cancer Other     Social History Social History   Tobacco Use  . Smoking status: Current Every Day Smoker    Packs/day:  0.50    Types: Cigarettes  . Smokeless tobacco: Never Used  Substance Use Topics  . Alcohol use: No  . Drug use: No     Allergies   Aspirin; Codeine; Hydrocodone; Oxycodone-acetaminophen; and Tape   Review of Systems Review of Systems  All other systems reviewed and are negative.    Physical Exam Updated Vital Signs BP (!) 130/95 (BP Location: Left Arm)   Pulse 84   Temp 97.8 F (36.6 C) (Oral)   Resp 12   Ht 5\' 6"  (1.676 m)   Wt 80.3 kg   SpO2 100%   BMI 28.57 kg/m   Physical Exam  Constitutional: She appears well-developed and well-nourished.  Musculoskeletal: She exhibits tenderness.  Fishhook in right 4th finger  Neurological: She is alert.  Skin: Skin is warm.  Psychiatric: She has a normal mood and affect.  Nursing note and vitals reviewed.    ED Treatments / Results  Labs (all labs ordered are listed, but only abnormal results are  displayed) Labs Reviewed - No data to display  EKG None  Radiology No results found.  Procedures .Foreign Body Removal Date/Time: 05/27/2018 8:38 AM Performed by: [name], PA-C Authorized by: [name], PA-C  Consent: Verbal consent obtained. Risks and benefits: risks, benefits and alternatives were discussed Consent given by: patient Patient understanding: patient states understanding of the procedure being performed Patient identity confirmed: verbally with patient Body area: skin Anesthesia: local infiltration  Anesthesia: Local Anesthetic: lidocaine 1% without epinephrine Depth: subcutaneous Complexity: simple 1 objects recovered. Objects recovered: fishhook Post-procedure assessment: foreign body removed Patient tolerance: Patient tolerated the procedure well with no immediate complications   (including critical care time)  Medications Ordered in ED Medications  Tdap (BOOSTRIX) injection 0.5 mL (has no administration in time range)  lidocaine (XYLOCAINE) 2 % injection 10 mL (10 mLs Infiltration Given 05/27/18 0737)     Initial Impression / Assessment and Plan / ED Course  I have reviewed the triage vital signs and the nursing notes.  Pertinent labs & imaging results that were available during my care of the patient were reviewed by me and considered in my medical decision making (see chart for details).     Pt given tetanus.  Pt advised to watch for any sign of infection  Final Clinical Impressions(s) / ED Diagnoses   Final diagnoses:  Fish hook injury of finger of right hand, initial encounter    ED Discharge Orders    None    An After Visit Summary was printed and given to the patient.   [name], PA-C 05/27/18 0840    Nat Christen, MD 05/27/18 [id number]

## 2018-05-27 NOTE — Discharge Instructions (Addendum)
Watch for any infection.  Return if any problems.

## 2018-05-27 NOTE — ED Triage Notes (Signed)
Pt has a fish hook embedded in RT ring finger. States she was cleaning her kitchen and it was in a bag. Attempted to pull it out. C/o mild numbness. Cap refill brisk. Last tetanus booster > 30 years ago.

## 2018-05-27 NOTE — ED Notes (Addendum)
Suture cart at bedside. Requested supplies set up on mayo stand.

## 2018-05-27 NOTE — ED Notes (Signed)
EDP at bedside  

## 2018-06-13 ENCOUNTER — Encounter: Payer: Self-pay | Admitting: Orthopaedic Surgery

## 2018-06-13 ENCOUNTER — Ambulatory Visit: Payer: BC Managed Care – PPO | Admitting: Orthopaedic Surgery

## 2018-06-13 DIAGNOSIS — M654 Radial styloid tenosynovitis [de Quervain]: Secondary | ICD-10-CM | POA: Diagnosis not present

## 2018-06-13 DIAGNOSIS — F17210 Nicotine dependence, cigarettes, uncomplicated: Secondary | ICD-10-CM

## 2018-06-13 NOTE — Patient Instructions (Signed)
Steps to Quit Smoking Smoking tobacco can be bad for your health. It can also affect almost every organ in your body. Smoking puts you and people around you at risk for many serious long-lasting (chronic) diseases. Quitting smoking is hard, but it is one of the best things that you can do for your health. It is never too late to quit. What are the benefits of quitting smoking? When you quit smoking, you lower your risk for getting serious diseases and conditions. They can include:  Lung cancer or lung disease.  Heart disease.  Stroke.  Heart attack.  Not being able to have children (infertility).  Weak bones (osteoporosis) and broken bones (fractures).  If you have coughing, wheezing, and shortness of breath, those symptoms may get better when you quit. You may also get sick less often. If you are pregnant, quitting smoking can help to lower your chances of having a baby of low birth weight. What can I do to help me quit smoking? Talk with your doctor about what can help you quit smoking. Some things you can do (strategies) include:  Quitting smoking totally, instead of slowly cutting back how much you smoke over a period of time.  Going to in-person counseling. You are more likely to quit if you go to many counseling sessions.  Using resources and support systems, such as: ? Online chats with a counselor. ? Phone quitlines. ? Printed self-help materials. ? Support groups or group counseling. ? Text messaging programs. ? Mobile phone apps or applications.  Taking medicines. Some of these medicines may have nicotine in them. If you are pregnant or breastfeeding, do not take any medicines to quit smoking unless your doctor says it is okay. Talk with your doctor about counseling or other things that can help you.  Talk with your doctor about using more than one strategy at the same time, such as taking medicines while you are also going to in-person counseling. This can help make  quitting easier. What things can I do to make it easier to quit? Quitting smoking might feel very hard at first, but there is a lot that you can do to make it easier. Take these steps:  Talk to your family and friends. Ask them to support and encourage you.  Call phone quitlines, reach out to support groups, or work with a counselor.  Ask people who smoke to not smoke around you.  Avoid places that make you want (trigger) to smoke, such as: ? Bars. ? Parties. ? Smoke-break areas at work.  Spend time with people who do not smoke.  Lower the stress in your life. Stress can make you want to smoke. Try these things to help your stress: ? Getting regular exercise. ? Deep-breathing exercises. ? Yoga. ? Meditating. ? Doing a body scan. To do this, close your eyes, focus on one area of your body at a time from head to toe, and notice which parts of your body are tense. Try to relax the muscles in those areas.  Download or buy apps on your mobile phone or tablet that can help you stick to your quit plan. There are many free apps, such as QuitGuide from the CDC (Centers for Disease Control and Prevention). You can find more support from smokefree.gov and other websites.  This information is not intended to replace advice given to you by your health care provider. Make sure you discuss any questions you have with your health care provider. Document Released: 05/29/2009 Document   Revised: 03/30/2016 Document Reviewed: 12/17/2014 Elsevier Interactive Patient Education  2018 Elsevier Inc.  

## 2018-06-13 NOTE — Progress Notes (Signed)
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  Procedure note: After permission from the patient, the first extensor compartment of the left wrist was prepped.  I then injected 1% plain Xylocaine and 1 cc of DepoMedrol 40 into the tendon sheath area of the first extensor compartment tolerated well.  I used sterile technique.  Return in two weeks.  Call if any problem.  Precautions discussed.   Electronically Signed Sanjuana Kava, MD 10/29/20193:57 PM

## 2018-06-27 ENCOUNTER — Ambulatory Visit: Payer: BC Managed Care – PPO | Admitting: Orthopaedic Surgery

## 2018-06-27 ENCOUNTER — Encounter: Payer: Self-pay | Admitting: Orthopaedic Surgery

## 2019-07-26 ENCOUNTER — Other Ambulatory Visit: Payer: Self-pay

## 2019-07-26 DIAGNOSIS — Z20822 Contact with and (suspected) exposure to covid-19: Secondary | ICD-10-CM

## 2019-07-27 ENCOUNTER — Telehealth: Payer: Self-pay | Admitting: *Deleted

## 2019-07-27 LAB — NOVEL CORONAVIRUS, NAA: SARS-CoV-2, NAA: NOT DETECTED

## 2019-07-27 NOTE — Telephone Encounter (Signed)
Pt calling for lab results, negative. Verbalizes understanding.

## 2019-08-22 ENCOUNTER — Ambulatory Visit (INDEPENDENT_AMBULATORY_CARE_PROVIDER_SITE_OTHER): Payer: BC Managed Care – PPO | Admitting: Orthopedic Surgery

## 2019-08-22 ENCOUNTER — Telehealth: Payer: Self-pay | Admitting: Orthopedic Surgery

## 2019-08-22 ENCOUNTER — Other Ambulatory Visit: Payer: Self-pay

## 2019-08-22 ENCOUNTER — Encounter: Payer: Self-pay | Admitting: Orthopedic Surgery

## 2019-08-22 VITALS — BP 147/84 | HR 87 | Ht 66.0 in | Wt 179.0 lb

## 2019-08-22 DIAGNOSIS — M654 Radial styloid tenosynovitis [de Quervain]: Secondary | ICD-10-CM | POA: Diagnosis not present

## 2019-08-22 NOTE — Telephone Encounter (Signed)
-----   Message from Carole Civil, MD sent at 08/21/2019  5:40 PM EST ----- Regarding: RE: Add on tomorrow 1/6 per Dr Lemmie Evens? Yes  ----- Message ----- From: Uvaldo Bristle Sent: 08/21/2019   1:32 PM EST To: Carole Civil, MD Subject: Add on tomorrow 1/6 per Dr Lemmie Evens?                  Dr Aline Brochure,  Patient - Jacqueline Haney [id number] relays that she had seen you at [facility] around holidays, and that you told her to come in tomorrow, 08/21/18, at 2:00?  She last was seen by Dr Raliegh Ip for DEQ  Please advise. Thank you

## 2019-08-22 NOTE — Patient Instructions (Addendum)
Injection  Joint Steroid Injection A joint steroid injection is a procedure to relieve swelling and pain in a joint. Steroids are medicines that reduce inflammation. In this procedure, your health care provider uses a syringe and a needle to inject a steroid medicine into a painful and inflamed joint. A pain-relieving medicine (anesthetic) may be injected along with the steroid. In some cases, your health care provider may use an imaging technique such as ultrasound or fluoroscopy to guide the injection. Joints that are often treated with steroid injections include the knee, shoulder, hip, and spine. These injections may also be used in the elbow, ankle, and joints of the hands or feet. You may have joint steroid injections as part of your treatment for inflammation caused by:  Gout.  Rheumatoid arthritis.  Advanced wear-and-tear arthritis (osteoarthritis).  Tendinitis.  Bursitis. Joint steroid injections may be repeated, but having them too often can damage a joint or the skin over the joint. You should not have joint steroid injections less than 6 weeks apart or more than four times a year. Tell a health care provider about:  Any allergies you have.  All medicines you are taking, including vitamins, herbs, eye drops, creams, and over-the-counter medicines.  Any problems you or family members have had with anesthetic medicines.  Any blood disorders you have.  Any surgeries you have had.  Any medical conditions you have.  Whether you are pregnant or may be pregnant. What are the risks? Generally, this is a safe treatment. However, problems may occur, including:  Infection.  Bleeding.  Allergic reactions to medicines.  Damage to the joint or tissues around the joint.  Thinning of skin or loss of skin color over the joint.  Temporary flushing of the face or chest.  Temporary increase in pain.  Temporary increase in blood sugar.  Failure to relieve inflammation or  pain. What happens before the treatment?  You may have imaging tests of your joint.  Ask your health care provider about: ? Changing or stopping your regular medicines. This is especially important if you are taking diabetes medicines or blood thinners. ? Taking medicines such as aspirin and ibuprofen. These medicines can thin your blood. Do not take these medicines unless your health care provider tells you to take them. ? Taking over-the-counter medicines, vitamins, herbs, and supplements.  Ask your health care provider if you can drive yourself home after the procedure. What happens during the treatment?   Your health care provider will position you for the injection and locate the injection site over your joint.  The skin over the joint will be cleaned with a germ-killing soap.  Your health care provider may: ? Spray a numbing solution (topical anesthetic) over the injection site. ? Inject a local anesthetic under the skin above your joint.  The needle will be placed through your skin into your joint. Your health care provider may use imaging to guide the needle to the right spot for the injection. If imaging is used, a special contrast dye may be injected to confirm that the needle is in the correct location.  The steroid medicine will be injected into your joint.  Anesthetic may be injected along with the steroid. This may be a medicine that relieves pain for a short time (short-acting anesthetic) or for a longer time (long-acting anesthetic).  The needle will be removed, and an adhesive bandage (dressing) will be placed over the injection site. The procedure may vary among health care providers and hospitals. What  can I expect after the treatment?  You will be able to go home after the treatment.  It is normal to feel slight flushing for a few days after the injection.  After the treatment, it is common to have an increase in joint pain after the anesthetic has worn off.  This may happen about an hour after a short-acting anesthetic or about 8 hours after a longer-acting anesthetic.  You should begin to feel relief from joint pain and swelling after 24 to 48 hours. Follow these instructions at home: Injection site care  Leave the adhesive dressing over your injection site in place until your health care provider says you can remove it.  Check your injection site every day for signs of infection. Check for: ? Redness, swelling, or pain. ? Fluid or blood. ? Warmth. ? Pus or a bad smell. Activity  Return to your normal activities as told by your health care provider. Ask your health care provider what activities are safe for you. You may be asked to limit activities that put stress on the joint for a few days.  Do joint exercises as told by your health care provider.  Do not take baths, swim, or use a hot tub until your health care provider approves. Managing pain, stiffness, and swelling   If directed, put ice on the joint. ? Put ice in a plastic bag. ? Place a towel between your skin and the bag. ? Leave the ice on for 20 minutes, 2-3 times a day.  Raise (elevate) your joint above the level of your heart when you are sitting or lying down. General instructions  Take over-the-counter and prescription medicines only as told by your health care provider.  Do not use any products that contain nicotine or tobacco, such as cigarettes, e-cigarettes, and chewing tobacco. These can delay joint healing. If you need help quitting, ask your health care provider.  If you have diabetes, be aware that your blood sugar may be slightly elevated for several days after the injection.  Keep all follow-up visits as told by your health care provider. This is important. Contact a health care provider if you have:  Chills or a fever.  Any signs of infection at your injection site.  Increased pain or swelling or no relief after 2 days. Summary  A joint steroid  injection is a treatment to relieve pain and swelling in a joint.  Steroids are medicines that reduce inflammation. Your health care provider may add an anesthetic along with the steroid.  You may have joint steroid injections as part of your arthritis treatment.  Joint steroid injections may be repeated, but having them too often can damage a joint or the skin over the joint.  Contact your health care provider if you have a fever, chills, or signs of infection or if you get no relief from joint pain or swelling. This information is not intended to replace advice given to you by your health care provider. Make sure you discuss any questions you have with your health care provider. Document Revised: 04/04/2018 Document Reviewed: 04/04/2018 Elsevier Patient Education  2020 [employer].

## 2019-08-22 NOTE — Telephone Encounter (Signed)
Called back to patient per Dr Ruthe Mannan approval for appointment Wednesday 08/21/18; reached her voice mail; left message.

## 2019-08-22 NOTE — Progress Notes (Signed)
Chief Complaint  Patient presents with  . Hand Pain    left thumb wants injection     64 year old female chronic de Quervain's syndrome left wrist request injection last injection October 29 did well   Procedure note injection left wrist for de Quervain's syndrome Consent for injection left wrist yes  Timeout to confirm injection site yes  Medication use Depo-Medrol 40 mg/cc, 1 cc Lidocaine 1% 3 cc   Alcohol and ethyl chloride were used to prepare the skin  25-gauge needle was used to inject the first extensor compartment  This was successful and there were no complications  Encounter Diagnosis  Name Primary?  Tennis Must Quervain's disease (radial styloid tenosynovitis) Yes

## 2019-08-22 NOTE — Telephone Encounter (Signed)
Done. Patient seen as scheduled.

## 2019-09-24 ENCOUNTER — Other Ambulatory Visit: Payer: Self-pay

## 2019-09-24 ENCOUNTER — Ambulatory Visit: Payer: BC Managed Care – PPO | Attending: Internal Medicine

## 2019-09-24 DIAGNOSIS — Z20822 Contact with and (suspected) exposure to covid-19: Secondary | ICD-10-CM

## 2019-09-25 ENCOUNTER — Telehealth: Payer: Self-pay

## 2019-09-25 ENCOUNTER — Telehealth: Payer: Self-pay | Admitting: *Deleted

## 2019-09-25 LAB — NOVEL CORONAVIRUS, NAA: SARS-CoV-2, NAA: NOT DETECTED

## 2019-09-25 NOTE — Telephone Encounter (Signed)
Pt notified of negative COVID-19 results. Understanding verbalized.  Jacqueline Haney   

## 2019-09-25 NOTE — Telephone Encounter (Signed)
Pt calling for covid results, active, not resulted as of yet. Pt verbalizes understanding.

## 2019-10-01 ENCOUNTER — Other Ambulatory Visit (HOSPITAL_COMMUNITY): Payer: Self-pay | Admitting: Family Medicine

## 2019-10-01 ENCOUNTER — Other Ambulatory Visit (HOSPITAL_COMMUNITY): Payer: Self-pay | Admitting: Internal Medicine

## 2019-10-01 DIAGNOSIS — Z1231 Encounter for screening mammogram for malignant neoplasm of breast: Secondary | ICD-10-CM

## 2019-10-03 ENCOUNTER — Ambulatory Visit (HOSPITAL_COMMUNITY)
Admission: RE | Admit: 2019-10-03 | Discharge: 2019-10-03 | Disposition: A | Discharge status: Home / Self Care | Payer: BC Managed Care – PPO | Source: Ambulatory Visit | Attending: Family Medicine | Admitting: Family Medicine

## 2019-10-03 ENCOUNTER — Other Ambulatory Visit: Payer: Self-pay

## 2019-10-03 DIAGNOSIS — Z1231 Encounter for screening mammogram for malignant neoplasm of breast: Secondary | ICD-10-CM

## 2019-10-04 ENCOUNTER — Ambulatory Visit (HOSPITAL_COMMUNITY): Payer: BC Managed Care – PPO

## 2019-10-16 ENCOUNTER — Other Ambulatory Visit: Payer: Self-pay

## 2019-10-16 ENCOUNTER — Ambulatory Visit: Payer: BC Managed Care – PPO

## 2019-10-27 ENCOUNTER — Ambulatory Visit: Payer: BC Managed Care – PPO | Attending: Internal Medicine

## 2019-10-27 DIAGNOSIS — Z23 Encounter for immunization: Secondary | ICD-10-CM

## 2019-10-27 NOTE — Progress Notes (Signed)
   Covid-19 Vaccination Clinic  Name:  Jacqueline Haney    MRN: [id number] DOB: [birth year]  10/27/2019  [name] was observed post Covid-19 immunization for 15 minutes without incident. She was provided with Vaccine Information Sheet and instruction to access the V-Safe system.   [name] was instructed to call 911 with any severe reactions post vaccine: Marland Kitchen Difficulty breathing  . Swelling of face and throat  . A fast heartbeat  . A bad rash all over body  . Dizziness and weakness   Immunizations Administered    Name Date Dose VIS Date Route   Moderna COVID-19 Vaccine 10/27/2019  9:40 AM 0.5 mL 07/17/2019 Intramuscular   Manufacturer: Moderna   Lot: [id number]   [id number]

## 2019-11-13 ENCOUNTER — Ambulatory Visit: Payer: BC Managed Care – PPO

## 2019-11-28 ENCOUNTER — Ambulatory Visit: Payer: BC Managed Care – PPO | Attending: Internal Medicine

## 2019-11-28 DIAGNOSIS — Z23 Encounter for immunization: Secondary | ICD-10-CM

## 2019-11-28 NOTE — Progress Notes (Signed)
   Covid-19 Vaccination Clinic  Name:  Jacqueline Haney    MRN: [id number] DOB: 07-Mar-1956  11/28/2019  [name] was observed post Covid-19 immunization for 30 minutes based on pre-vaccination screening without incident. She was provided with Vaccine Information Sheet and instruction to access the V-Safe system. ,Pt. Refused  To stay 30 min. Stayed only 20 min. Advised of possible reactions .  [name] was instructed to call 911 with any severe reactions post vaccine: Marland Kitchen Difficulty breathing  . Swelling of face and throat  . A fast heartbeat  . A bad rash all over body  . Dizziness and weakness   Immunizations Administered    Name Date Dose VIS Date Route   Moderna COVID-19 Vaccine 11/28/2019  9:04 AM 0.5 mL 07/17/2019 Intramuscular   Manufacturer: Moderna   Lot: [id number]   [id number]

## 2020-11-12 ENCOUNTER — Encounter (HOSPITAL_COMMUNITY): Payer: Self-pay

## 2020-11-12 ENCOUNTER — Emergency Department (HOSPITAL_COMMUNITY): Payer: Medicare PPO

## 2020-11-12 ENCOUNTER — Emergency Department (HOSPITAL_COMMUNITY)
Admission: EM | Admit: 2020-11-12 | Discharge: 2020-11-12 | Disposition: A | Discharge status: Home / Self Care | Payer: Medicare PPO | Attending: Emergency Medicine | Admitting: Emergency Medicine

## 2020-11-12 ENCOUNTER — Other Ambulatory Visit: Payer: Self-pay

## 2020-11-12 DIAGNOSIS — Z859 Personal history of malignant neoplasm, unspecified: Secondary | ICD-10-CM | POA: Insufficient documentation

## 2020-11-12 DIAGNOSIS — R112 Nausea with vomiting, unspecified: Secondary | ICD-10-CM | POA: Diagnosis not present

## 2020-11-12 DIAGNOSIS — F17210 Nicotine dependence, cigarettes, uncomplicated: Secondary | ICD-10-CM | POA: Diagnosis not present

## 2020-11-12 DIAGNOSIS — R1013 Epigastric pain: Secondary | ICD-10-CM

## 2020-11-12 DIAGNOSIS — R519 Headache, unspecified: Secondary | ICD-10-CM | POA: Diagnosis not present

## 2020-11-12 LAB — CBC WITH DIFFERENTIAL/PLATELET
Basophils Absolute: 0.1 10*3/uL (ref 0.0–0.1)
Basophils Relative: 1 %
Eosinophils Absolute: 0 10*3/uL (ref 0.0–0.5)
Eosinophils Relative: 0 %
HCT: 45.1 % (ref 36.0–46.0)
Hemoglobin: 15.1 g/dL — ABNORMAL HIGH (ref 12.0–15.0)
Lymphocytes Relative: 27 %
Lymphs Abs: 2.7 10*3/uL (ref 0.7–4.0)
MCH: 29.2 pg (ref 26.0–34.0)
MCHC: 33.5 g/dL (ref 30.0–36.0)
MCV: 87.1 fL (ref 80.0–100.0)
Metamyelocytes Relative: 2 %
Monocytes Absolute: 0.9 10*3/uL (ref 0.1–1.0)
Monocytes Relative: 9 %
Neutro Abs: 6 10*3/uL (ref 1.7–7.7)
Neutrophils Relative %: 61 %
Platelets: 360 10*3/uL (ref 150–400)
RBC: 5.18 MIL/uL — ABNORMAL HIGH (ref 3.87–5.11)
RDW: 13.9 % (ref 11.5–15.5)
WBC: 9.9 10*3/uL (ref 4.0–10.5)
nRBC: 0 % (ref 0.0–0.2)

## 2020-11-12 LAB — COMPREHENSIVE METABOLIC PANEL
ALT: 27 U/L (ref 0–44)
AST: 25 U/L (ref 15–41)
Albumin: 3.9 g/dL (ref 3.5–5.0)
Alkaline Phosphatase: 83 U/L (ref 38–126)
Anion gap: 8 (ref 5–15)
BUN: 12 mg/dL (ref 8–23)
CO2: 22 mmol/L (ref 22–32)
Calcium: 9.4 mg/dL (ref 8.9–10.3)
Chloride: 107 mmol/L (ref 98–111)
Creatinine, Ser: 0.58 mg/dL (ref 0.44–1.00)
GFR, Estimated: >60 mL/min (ref >60)
Glucose, Bld: 113 mg/dL — ABNORMAL HIGH (ref 70–99)
Potassium: 3.6 mmol/L (ref 3.5–5.1)
Sodium: 137 mmol/L (ref 135–145)
Total Bilirubin: 0.3 mg/dL (ref 0.3–1.2)
Total Protein: 7.3 g/dL (ref 6.5–8.1)

## 2020-11-12 LAB — URINALYSIS, ROUTINE W REFLEX MICROSCOPIC
Bilirubin Urine: NEGATIVE
Glucose, UA: NEGATIVE mg/dL
Ketones, ur: NEGATIVE mg/dL
Leukocytes,Ua: NEGATIVE
Nitrite: NEGATIVE
Protein, ur: 100 mg/dL — AB
Specific Gravity, Urine: 1.012 (ref 1.005–1.030)
pH: 5 (ref 5.0–8.0)

## 2020-11-12 LAB — TROPONIN I (HIGH SENSITIVITY)
Troponin I (High Sensitivity): <2 ng/L (ref <18)
Troponin I (High Sensitivity): <2 ng/L (ref <18)

## 2020-11-12 LAB — LIPASE, BLOOD: Lipase: 21 U/L (ref 11–51)

## 2020-11-12 MED ORDER — ONDANSETRON HCL 4 MG/2ML IJ SOLN
4.0000 mg | Freq: Once | INTRAMUSCULAR | Status: AC
  Administered 2020-11-12: 4 mg via INTRAVENOUS
  Filled 2020-11-12: qty 2

## 2020-11-12 MED ORDER — PANTOPRAZOLE SODIUM 40 MG IV SOLR
40.0000 mg | Freq: Once | INTRAVENOUS | Status: AC
  Administered 2020-11-12: 40 mg via INTRAVENOUS
  Filled 2020-11-12: qty 40

## 2020-11-12 MED ORDER — MORPHINE SULFATE (PF) 4 MG/ML IV SOLN
4.0000 mg | Freq: Once | INTRAVENOUS | Status: AC
  Administered 2020-11-12: 4 mg via INTRAVENOUS
  Filled 2020-11-12: qty 1

## 2020-11-12 MED ORDER — SUCRALFATE 1 G PO TABS: 1.0000 g | ORAL_TABLET | Freq: Three times a day (TID) | ORAL | 0 refills | Status: AC

## 2020-11-12 MED ORDER — IOHEXOL 300 MG/ML  SOLN
100.0000 mL | Freq: Once | INTRAMUSCULAR | Status: AC | PRN
  Administered 2020-11-12: 100 mL via INTRAVENOUS

## 2020-11-12 MED ORDER — PANTOPRAZOLE SODIUM 20 MG PO TBEC: 20.0000 mg | DELAYED_RELEASE_TABLET | Freq: Every day | ORAL | 0 refills | Status: AC

## 2020-11-12 NOTE — ED Notes (Signed)
PO ginger ale given

## 2020-11-12 NOTE — Discharge Instructions (Addendum)
It was our pleasure taking care of you here in the emergency department  As discussed your labs and imaging are reassuring.  You likely have some degree of ulcer due to your Motrin use.  It is imperative that you stop using all anti-inflammatory agents including your Motrin.  Written you to add in a few medications to coat your stomach.  You need close follow-up with the GI doctors.  I referred you to one at discharge  Return if you develop any new or worsening symptoms such as bloody stool, worsening pain.

## 2020-11-12 NOTE — ED Triage Notes (Signed)
Pain is better with standing and ambulation.

## 2020-11-12 NOTE — ED Provider Notes (Signed)
  Face-to-face evaluation   History: She presents for evaluation of abdominal pain which started suddenly yesterday.  She states it feels like previously when she had a perforated viscus.  The pain is a "cramp."  She had some limited vomiting yesterday but no ongoing nausea or vomiting.  She was able to eat breakfast this morning.  She denies fever, chills, dizziness or weakness.  She is taking her usual prescribed medications.  No known sick contacts.  Physical exam: Alert, cooperative and appears comfortable.  Abdomen is diffusely tender in the left and right upper quadrants.  Tenderness is mild.  There is no rebound tenderness  Medical screening examination/treatment/procedure(s) were conducted as a shared visit with non-physician practitioner(s) and myself.  I personally evaluated the patient during the encounter    Daleen Bo, MD 11/12/20 [phone]

## 2020-11-12 NOTE — ED Notes (Signed)
Took pt to bathroom by means of wheelchair

## 2020-11-12 NOTE — ED Triage Notes (Signed)
Patient reports abdominal pain that started yesterday. States that she had diarrhea then and woke with N/V this am. Describes pain as being hit in the stomach. States that she had this issue in past and was admitted to ICU but does not know diagnosis.

## 2020-11-12 NOTE — ED Provider Notes (Signed)
[facility] EMERGENCY DEPARTMENT Provider Note   CSN: [id number] Arrival date & time: 11/12/20  3300     History Chief Complaint  Patient presents with  . Abdominal Pain    Jacqueline Haney is a 65 y.o. female with past medical history significant for chronic daily headaches, hyperlipidemia who presents for evaluation of epigastric pain.  Pain began yesterday.  Located to epigastric region.  Does not radiate into back.  Describes as a throbbing and aching sensation.  States she does have some reflux however denies any overt chest pain, shortness of breath.  No cough, mopped assist or hematemesis.  Has had some loose stools since yesterday however was unable to quantify.  She denies any melena or bright red blood per rectum.  Had episode of nausea and vomiting this morning.  Emesis is NBNB.  States she had some similar in the past however is unsure what this was related to.  She denies any alcohol or illicit substance use.  Does states she takes approximately 6 Motrin daily for her chronic headache.  States her headaches have not changed and frequently is just her "typical headache."  No recent head trauma blurred vision, slurred speech, paresthesias, weakness or difficulty with word finding.  Patient states her pain is better with standing and ambulation.  States when she was hospitalized for a previous episode she was seen by GI provider however has not been seen since.  She is unsure if she has had a colonoscopy in the last 10 years.  Denies fever, chills, chest pain, shortness of breath, dysuria, diarrhea stools, constipation, paresthesias or weakness.  She rates her current pain a 9/10.  Does state that her pain is worse with food intake when she tried to eat twice yesterday she has not taken anything for her pain.   Prior abdominal surgeries include hysterectomy, splenectomy  History obtained from patient and past medical records.  No interpreter used  HPI     Past Medical History:   Diagnosis Date  . Cancer ([place])   . Chest pain    ACS rules out stress test negative  . Hyperlipidemia    refuses meds/diet controlled  . 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 Migraine     Patient Active Problem List   Diagnosis Date Noted  . Duodenitis   . Abdominal pain, epigastric   . 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 Excessive use of nonsteroidal anti-inflammatory drugs (NSAIDs)   . Retroperitoneal bleed 02/08/2018  . Family history of coronary artery disease 09/18/2014  . Chest pain at rest 09/17/2014  . Obesity 09/17/2014  . Tobacco abuse 09/17/2014  . Chest pain 09/17/2014  . DERANGEMENT MENISCUS 09/16/2009  . KNEE PAIN 09/16/2009  . SHOULDER PAIN 01/10/2008  . CERVICAL RADICULITIS 01/10/2008  . CERVICAL SPASM 01/10/2008    Past Surgical History:  Procedure Laterality Date  . ABDOMINAL HYSTERECTOMY    . BIOPSY  02/10/2018   Procedure: BIOPSY;  Surgeon: Gatha Mayer, MD;  Location: [facility];  Service: Endoscopy;;  . ESOPHAGOGASTRODUODENOSCOPY (EGD) WITH PROPOFOL N/A 02/10/2018   Procedure: ESOPHAGOGASTRODUODENOSCOPY (EGD) WITH PROPOFOL;  Surgeon: Gatha Mayer, MD;  Location: [facility];  Service: Endoscopy;  Laterality: N/A;  . SPLENECTOMY       OB History    Gravida  2   Para  2   Term  2   Preterm      AB      Living  SAB      IAB      Ectopic      Multiple      Live Births              Family History  Problem Relation Age of Onset  . Cancer Mother   . Cancer Father   . Kidney failure Sister   . Heart attack Sister   . Cancer Brother   . Cancer Other     Social History   Tobacco Use  . Smoking status: Current Every Day Smoker    Packs/day: 0.50    Types: Cigarettes  . Smokeless tobacco: Never Used  Vaping Use  . Vaping Use: Never used  Substance Use Topics  . Alcohol use: No  . Drug use: No    Home Medications Prior to Admission medications   Medication Sig Start Date End Date Taking?  Authorizing Provider  pantoprazole (PROTONIX) 20 MG tablet Take 1 tablet (20 mg total) by mouth daily. 11/12/20 12/12/20 Yes Karesa Maultsby A, PA-C  sucralfate (CARAFATE) 1 g tablet Take 1 tablet (1 g total) by mouth 4 (four) times daily -  with meals and at bedtime for 10 days. 11/12/20 11/22/20 Yes Saleem Coccia A, PA-C    Allergies    Aspirin, Codeine, Hydrocodone, Oxycodone-acetaminophen, and Tape  Review of Systems   Review of Systems  Constitutional: Negative.   HENT: Negative.   Respiratory: Negative.   Cardiovascular: Negative.   Gastrointestinal: Positive for abdominal pain, diarrhea, nausea and vomiting. Negative for abdominal distention, anal bleeding, blood in stool and constipation.  Genitourinary: Negative.   Musculoskeletal: Negative.   Skin: Negative.   Neurological: Positive for headaches (Chronic, daily).  All other systems reviewed and are negative.   Physical Exam Updated Vital Signs BP 128/87   Pulse 77   Temp 98.7 F (37.1 C) (Oral)   Resp 20   Ht 5\' 6"  (1.676 m)   Wt 83.5 kg   SpO2 97%   BMI 29.70 kg/m   Physical Exam Vitals and nursing note reviewed.  Constitutional:      General: She is not in acute distress.    Appearance: She is well-developed. She is not ill-appearing, toxic-appearing or diaphoretic.  HENT:     Head: Normocephalic and atraumatic.     Mouth/Throat:     Mouth: Mucous membranes are moist.  Eyes:     Pupils: Pupils are equal, round, and reactive to light.  Cardiovascular:     Rate and Rhythm: Normal rate.     Pulses: Normal pulses.          Radial pulses are 2+ on the right side and 2+ on the left side.     Heart sounds: Normal heart sounds.  Pulmonary:     Effort: Pulmonary effort is normal. No respiratory distress.     Breath sounds: Normal breath sounds.     Comments: Speaks in full sentences without difficulty Abdominal:     General: Bowel sounds are normal. There is no distension.     Palpations: Abdomen is soft.      Tenderness: There is abdominal tenderness in the epigastric area. There is guarding. There is no rebound. Negative signs include Auble's sign.     Hernia: No hernia is present.     Comments: Diffuse tenderness to upper abdomen however worse to epigastric region.  There is voluntary guarding.  No rebound.  No distention.  No midline pulsatile abdominal mass.  Negative Chahal sign.  Large  scar to left upper abdomen from prior splenectomy  Genitourinary:    Comments: Declined rectal exam to assess for occult blood Musculoskeletal:        General: Normal range of motion.     Cervical back: Normal range of motion.     Comments: Moves all 4 extremities at difficulty.  No bony tenderness.  Compartments soft  Skin:    General: Skin is warm and dry.     Capillary Refill: Capillary refill takes less than 2 seconds.     Comments: No edema, erythema or warmth.  No fluctuance or induration  Neurological:     General: No focal deficit present.     Mental Status: She is alert.     Sensory: Sensation is intact.     Motor: Motor function is intact.     Gait: Gait is intact.     Comments: Cranial nerves II through XII grossly intact Ambulatory without difficulty Intact sensation    ED Results / Procedures / Treatments   Labs (all labs ordered are listed, but only abnormal results are displayed) Labs Reviewed  CBC WITH DIFFERENTIAL/PLATELET - Abnormal; Notable for the following components:      Result Value   RBC 5.18 (*)    Hemoglobin 15.1 (*)    All other components within normal limits  COMPREHENSIVE METABOLIC PANEL - Abnormal; Notable for the following components:   Glucose, Bld 113 (*)    All other components within normal limits  URINALYSIS, ROUTINE W REFLEX MICROSCOPIC - Abnormal; Notable for the following components:   APPearance HAZY (*)    Hgb urine dipstick MODERATE (*)    Protein, ur 100 (*)    Bacteria, UA MANY (*)    All other components within normal limits  URINE CULTURE   LIPASE, BLOOD  TROPONIN I (HIGH SENSITIVITY)  TROPONIN I (HIGH SENSITIVITY)    EKG EKG Interpretation  Date/Time:  Wednesday November 12 2020 09:40:33 EDT Ventricular Rate:  70 PR Interval:  145 QRS Duration: 80 QT Interval:  416 QTC Calculation: 449 R Axis:   -49 Text Interpretation: Sinus rhythm Probable left atrial enlargement Left anterior fascicular block RSR' in V1 or V2, right VCD or RVH Baseline wander in lead(s) V6 since last tracing no significant change Confirmed by Daleen Bo [phone]) on 11/12/2020 10:29:27 AM   Radiology DG Chest 2 View  Result Date: 11/12/2020 CLINICAL DATA:  Chest pain EXAM: CHEST - 2 VIEW COMPARISON:  October 08, 2016 FINDINGS: The lungs are clear. Heart is upper normal in size with pulmonary vascularity normal. No adenopathy. No pneumothorax. No bone lesions. There are surgical clips in the upper left abdomen. IMPRESSION: Lungs clear.  Heart upper normal in size. Electronically Signed   By: Lowella Grip III M.D.   On: 11/12/2020 11:36   CT Abdomen Pelvis W Contrast  Result Date: 11/12/2020 CLINICAL DATA:  Epigastric pain, history of perforated ulcer. Nausea and vomiting. EXAM: CT ABDOMEN AND PELVIS WITH CONTRAST TECHNIQUE: Multidetector CT imaging of the abdomen and pelvis was performed using the standard protocol following bolus administration of intravenous contrast. CONTRAST:  122mL OMNIPAQUE IOHEXOL 300 MG/ML  SOLN COMPARISON:  Multiple exams, including 02/22/2018 FINDINGS: Lower chest: Dependent subsegmental atelectasis in both lower lobes. Hepatobiliary: Unremarkable Pancreas: Unremarkable Spleen: Splenectomy Adrenals/Urinary Tract: 1.6 by 1.8 cm left adrenal mass with relative washout of 47% consistent with adenoma. The lesion is stable in size from 02/22/2018. Fullness along the right adrenal gland without overt mass. 1.3 by 1.2 cm hypodense  lesion in the left mid kidney is likely a cyst but technically too small to characterize. This was  previously 1.2 by 1.1 cm. Stomach/Bowel: No significant inflammatory findings along the stomach or duodenum. Normal appendix. No dilated bowel. Vascular/Lymphatic: Aortoiliac atherosclerotic vascular disease. Reproductive: Uterus absent.  Adnexa unremarkable. Other: No supplemental non-categorized findings. Musculoskeletal: Lower lumbar spondylosis and degenerative disc disease contributing to mild multilevel impingement. IMPRESSION: 1. A specific cause for the patient's epigastric pain is not identified. 2. Other imaging findings of potential clinical significance: Left adrenal adenoma. Aortic Atherosclerosis (ICD10-I70.0). Mild multilevel lumbar impingement. Electronically Signed   By: Van Clines M.D.   On: 11/12/2020 11:52    Procedures Procedures   Medications Ordered in ED Medications  pantoprazole (PROTONIX) injection 40 mg (40 mg Intravenous Given 11/12/20 0943)  morphine 4 MG/ML injection 4 mg (4 mg Intravenous Given 11/12/20 0944)  ondansetron (ZOFRAN) injection 4 mg (4 mg Intravenous Given 11/12/20 0943)  iohexol (OMNIPAQUE) 300 MG/ML solution 100 mL (100 mLs Intravenous Contrast Given 11/12/20 1100)    ED Course  I have reviewed the triage vital signs and the nursing notes.  Pertinent labs & imaging results that were available during my care of the patient were reviewed by me and considered in my medical decision making (see chart for details).  65 year old here for evaluation epigastric pain.  She is afebrile, nonseptic, not ill-appearing.  Does have something similar in the past.  Per chart review seems like she has had retroperitoneal bleed likely from perforated ulcer.  Unfortunately patient has chronic daily headaches and takes significant amount of NSAIDs for this daily.  She denies any melanotic or bright blood per rectum.  Has had some emesis which she denies any coffee-ground emesis.  Does endorse some reflux type symptoms were denies any overt chest pain.  No abdominal  pain radiating into back.  She is neurovascularly intact.  Low suspicion for dissection, ruptured, AAA.  Her heart and lungs are clear.  Patient declines rectal exam to assess for rectal bleed. We will plan on labs, imaging, pain management, start Protonix and reassess.  Labs and imaging personally reviewed and interpreted:  CBC without leukocytosis, hemoglobin 15.1 CMP glucose 113, BUN at 12 which is reassuring no additional electrolyte, renal or liver abnormality Lipase 21 CT abdomen pelvis Troponin <2 EKG No STEMI, no change from previous UA many bacteria however nitrate, leuks negative. Will culture. No UTI symptoms  Patient reassessed. Pain significantly improved.  Discussed labs and imagings.  Patient would like to trial p.o. intake and DC home if possible.  I discussed with patient p.o. challenge and she will need close follow-up with GI and complete cessation of NSAIDs.  She is agreeable to this.  Patient reassessed.  She is tolerating p.o. intake without difficulty as well as some crackers.  Again, she would like to try outpatient treatment.  Given her reassuring BUN, reassuring imaging low suspicion for acute viscus perforation, bleeding ulcer at this time however again declines rectal exam.  Likely has some degree of PUD from NSAID use.  Started on Carafate, PPI.  Discussed close follow-up with GI and return for any worsening symptoms.  She is agreeable for this.  The patient has been appropriately medically screened and/or stabilized in the ED. I have low suspicion for any other emergent medical condition which would require further screening, evaluation or treatment in the ED or require inpatient management.  Patient is hemodynamically stable and in no acute distress.  Patient able to ambulate  in department prior to ED.  Evaluation does not show acute pathology that would require ongoing or additional emergent interventions while in the emergency department or further inpatient  treatment.  I have discussed the diagnosis with the patient and answered all questions.  Pain is been managed while in the emergency department and patient has no further complaints prior to discharge.  Patient is comfortable with plan discussed in room and is stable for discharge at this time.  I have discussed strict return precautions for returning to the emergency department.  Patient was encouraged to follow-up with PCP/specialist refer to at discharge.    MDM Rules/Calculators/A&P                           Final Clinical Impression(s) / ED Diagnoses Final diagnoses:  Epigastric pain    Rx / DC Orders ED Discharge Orders         Ordered    sucralfate (CARAFATE) 1 g tablet  3 times daily with meals & bedtime        11/12/20 1407    pantoprazole (PROTONIX) 20 MG tablet  Daily        11/12/20 1407           Sharene Krikorian A, PA-C 11/12/20 1415    Daleen Bo, MD 11/12/20 [phone]

## 2020-11-14 LAB — URINE CULTURE

## 2020-11-24 ENCOUNTER — Ambulatory Visit: Payer: Medicare PPO | Admitting: Orthopedic Surgery

## 2020-11-24 ENCOUNTER — Encounter: Payer: Self-pay | Admitting: Orthopedic Surgery

## 2020-11-24 ENCOUNTER — Other Ambulatory Visit: Payer: Self-pay

## 2020-11-24 ENCOUNTER — Ambulatory Visit: Payer: Medicare PPO

## 2020-11-24 VITALS — BP 145/95 | HR 91 | Ht 66.0 in | Wt 180.0 lb

## 2020-11-24 DIAGNOSIS — M792 Neuralgia and neuritis, unspecified: Secondary | ICD-10-CM

## 2020-11-24 DIAGNOSIS — M4722 Other spondylosis with radiculopathy, cervical region: Secondary | ICD-10-CM

## 2020-11-24 DIAGNOSIS — M654 Radial styloid tenosynovitis [de Quervain]: Secondary | ICD-10-CM

## 2020-11-24 MED ORDER — GABAPENTIN 100 MG PO CAPS: 100.0000 mg | ORAL_CAPSULE | Freq: Three times a day (TID) | ORAL | 2 refills | Status: AC

## 2020-11-24 MED ORDER — PREDNISONE 10 MG (48) PO TBPK
ORAL_TABLET | Freq: Every day | ORAL | 0 refills | Status: DC
Start: 2020-11-24 — End: 2021-04-30

## 2020-11-24 NOTE — Progress Notes (Addendum)
Jacqueline Haney  11/24/2020  Body mass index is 29.05 kg/m.  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  . Radicular pain of left upper extremity Yes  . De Quervain's tenosynovitis, left     Imaging Cervical spine imaging shows multiple areas of disc space disease with anterior osteophyte at C5-6 and then on AP uncovertebral joint osteophytes in the mid cervical region  Plan:  (Rx., Inj., surg., Frx, MRI/CT, XR:2)  Recommend medication Meds ordered this encounter  Medications  . predniSONE (STERAPRED UNI-PAK 48 TAB) 10 MG (48) TBPK tablet    Sig: Take by mouth daily.    Dispense:  48 tablet    Refill:  0  . gabapentin (NEURONTIN) 100 MG capsule    Sig: Take 1 capsule (100 mg total) by mouth 3 (three) times daily.    Dispense:  90 capsule    Refill:  2    Injection first extensor compartment procedure note  Procedure inject first extensor compartment Consent was given verbally Site was confirmed as left first extensor compartment Celestone 6 mg Lidocaine 1% 2 cc Injected first extensor compartment no complications   HISTORY SECTION :  Chief Complaint  Patient presents with  . Hand Pain    Left thumb   HPI The patient presents for evaluation of severe pain left wrist over the first extensor compartment recurrent last seen in 2019 by Dr.Keeling she has had 3 injections pain has returned  Last injection was around October 2019  She also has pain running from her cervical spine down into her left hand denies numbness or tingling but only complains of pain radiating    Review of Systems  Constitutional: Negative for chills and fever.  Musculoskeletal: Positive for neck pain.  Neurological: Negative for tingling, tremors,  sensory change and focal weakness.  Psychiatric/Behavioral: Negative for depression.     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   Past Surgical History:  Procedure Laterality Date  . ABDOMINAL HYSTERECTOMY    . BIOPSY  02/10/2018   Procedure: BIOPSY;  Surgeon: Gatha Mayer, MD;  Location: [place];  Service: Endoscopy;;  . ESOPHAGOGASTRODUODENOSCOPY (EGD) WITH PROPOFOL N/A 02/10/2018   Procedure: ESOPHAGOGASTRODUODENOSCOPY (EGD) WITH PROPOFOL;  Surgeon: Gatha Mayer, MD;  Location: [facility];  Service: Endoscopy;  Laterality: N/A;  . SPLENECTOMY      Social History   Tobacco Use  . Smoking status: Current Every Day Smoker    Packs/day: 0.50    Types: Cigarettes  . Smokeless tobacco: Never Used  Vaping Use  . Vaping Use: Never used  Substance Use Topics  . Alcohol use: No  . Drug use: No    Family History  Problem Relation Age of Onset  . Cancer Mother   . Cancer Father   . Kidney failure Sister   . Heart attack Sister   . Cancer Brother   . Cancer Other       Allergies  Allergen Reactions  . Aspirin Nausea And Vomiting  . Codeine Other (See Comments)    Alters mental status "climbs walls/feels high"  . Hydrocodone Other (See Comments)    Alters mental status "climbs walls/feels high"  . Oxycodone-Acetaminophen Other (See Comments)  Alters mental status "climbing a while/high"  . Tape Dermatitis     Current Outpatient Medications:  .  gabapentin (NEURONTIN) 100 MG capsule, Take 1 capsule (100 mg total) by mouth 3 (three) times daily., Disp: 90 capsule, Rfl: 2 .  pantoprazole (PROTONIX) 20 MG tablet, Take 1 tablet (20 mg total) by mouth daily., Disp: 30 tablet, Rfl: 0 .  predniSONE (STERAPRED UNI-PAK 48 TAB) 10 MG (48) TBPK tablet, Take by mouth daily., Disp: 48 tablet, Rfl: 0 .  sucralfate (CARAFATE) 1 g tablet, Take 1 tablet (1 g total) by mouth 4 (four) times  daily -  with meals and at bedtime for 10 days., Disp: 40 tablet, Rfl: 0   PHYSICAL EXAM SECTION: BP (!) 145/95   Pulse 91   Ht 5\' 6"  (1.676 m)   Wt 180 lb (81.6 kg)   BMI 29.05 kg/m   Body mass index is 29.05 kg/m.   General appearance: Well-developed well-nourished no gross deformities  Eyes clear normal vision no evidence of conjunctivitis or jaundice, extraocular muscles intact  ENT: ears hearing normal, nasal passages clear, throat clear   Lymph nodes: No lymphadenopathy  Neck is supple without palpable mass, full range of motion  Cardiovascular normal pulse and perfusion in all 4 extremities normal color without edema  Neurologically deep tendon reflexes are equal and normal, no sensation loss or deficits no pathologic reflexes  Psychological: Awake alert and oriented x3 mood and affect normal  Skin no lacerations or ulcerations no nodularity no palpable masses, no erythema or nodularity  Musculoskeletal:   Left upper extremity she is tender over the first extensor compartment she has a positive Finkelstein's test there is swelling over the first extensor compartment otherwise neurovascular exam is intact  She does have some neck tenderness and stiffness and popping when she turns her neck   5:14 PM

## 2020-11-24 NOTE — Addendum Note (Signed)
Addended by: Carole Civil on: 11/24/2020 05:17 PM   Modules accepted: Level of Service

## 2020-12-22 ENCOUNTER — Encounter: Payer: Self-pay | Admitting: Orthopedic Surgery

## 2020-12-22 ENCOUNTER — Ambulatory Visit: Payer: Medicare PPO | Admitting: Orthopedic Surgery

## 2021-04-04 ENCOUNTER — Emergency Department (HOSPITAL_COMMUNITY)
Admission: EM | Admit: 2021-04-04 | Discharge: 2021-04-04 | Disposition: A | Discharge status: Home / Self Care | Payer: Medicare PPO | Attending: Student | Admitting: Student

## 2021-04-04 ENCOUNTER — Other Ambulatory Visit: Payer: Self-pay

## 2021-04-04 ENCOUNTER — Emergency Department (HOSPITAL_COMMUNITY): Payer: Medicare PPO

## 2021-04-04 ENCOUNTER — Encounter (HOSPITAL_COMMUNITY): Payer: Self-pay

## 2021-04-04 DIAGNOSIS — F17210 Nicotine dependence, cigarettes, uncomplicated: Secondary | ICD-10-CM | POA: Diagnosis not present

## 2021-04-04 DIAGNOSIS — M25531 Pain in right wrist: Secondary | ICD-10-CM

## 2021-04-04 NOTE — ED Triage Notes (Signed)
Pt to er, pt states that last night she started having some R wrist pain, states that this am the pain was worse, states that she has some swelling to the area, denies injury or trauma or repetitive motion injury

## 2021-04-04 NOTE — Discharge Instructions (Addendum)
Return if any problems. Tylenol for pain.  Follow up with Orthopaedist if pain persist

## 2021-04-04 NOTE — ED Notes (Signed)
Pt to xray at this time.

## 2021-04-04 NOTE — ED Provider Notes (Signed)
[facility] EMERGENCY DEPARTMENT Provider Note   CSN: [id number] Arrival date & time: 04/04/21  [id number]     History Chief Complaint  Patient presents with   Wrist Pain    Jacqueline Haney is a 65 y.o. female.  Pt complains of pain in her right wrist.  Pt reports she has been taking classes and has been learning to type.    The history is provided by the patient. No language interpreter was used.  Wrist Pain This is a new problem. The current episode started 12 to 24 hours ago. The problem occurs constantly. The problem has not changed since onset.Nothing aggravates the symptoms. Nothing relieves the symptoms. She has tried nothing for the symptoms. The treatment provided no relief.      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[place])    Migraine     Patient Active Problem List   Diagnosis Date Noted   Duodenitis    Abdominal pain, epigastric    Median arcuate ligament syndrome (HCC)    Excessive use of nonsteroidal anti-inflammatory drugs (NSAIDs)    Retroperitoneal bleed 02/08/2018   Family history of coronary artery disease 09/18/2014   Chest pain at rest 09/17/2014   Obesity 09/17/2014   Tobacco abuse 09/17/2014   Chest pain 09/17/2014   DERANGEMENT MENISCUS 09/16/2009   KNEE PAIN 09/16/2009   SHOULDER PAIN 01/10/2008   CERVICAL RADICULITIS 01/10/2008   CERVICAL SPASM 01/10/2008    Past Surgical History:  Procedure Laterality Date   ABDOMINAL HYSTERECTOMY     BIOPSY  02/10/2018   Procedure: BIOPSY;  Surgeon: Gatha Mayer, MD;  Location: [facility] ENDOSCOPY;  Service: Endoscopy;;   ESOPHAGOGASTRODUODENOSCOPY (EGD) WITH PROPOFOL N/A 02/10/2018   Procedure: ESOPHAGOGASTRODUODENOSCOPY (EGD) WITH PROPOFOL;  Surgeon: Gatha Mayer, MD;  Location: [facility] ENDOSCOPY;  Service: Endoscopy;  Laterality: N/A;   SPLENECTOMY       OB History      Gravida  2   Para  2   Term  2   Preterm      AB      Living         SAB      IAB      Ectopic      Multiple      Live Births              Family History  Problem Relation Age of Onset   Cancer Mother    Cancer Father    Kidney failure Sister    Heart attack Sister    Cancer Brother    Cancer Other     Social History   Tobacco Use   Smoking status: Every Day    Packs/day: 0.50    Types: Cigarettes   Smokeless tobacco: Never  Vaping Use   Vaping Use: Never used  Substance Use Topics   Alcohol use: No   Drug use: No    Home Medications Prior to Admission medications   Medication Sig Start Date End Date Taking? Authorizing Provider  gabapentin (NEURONTIN) 100 MG capsule Take 1 capsule (100 mg total) by mouth 3 (three) times daily. 11/24/20   Carole Civil, MD  pantoprazole (PROTONIX) 20 MG tablet Take 1 tablet (20 mg total) by mouth daily. 11/12/20 12/12/20  Henderly, Britni A, PA-C  predniSONE (STERAPRED UNI-PAK 48 TAB) 10  MG (48) TBPK tablet Take by mouth daily. 11/24/20   Carole Civil, MD  sucralfate (CARAFATE) 1 g tablet Take 1 tablet (1 g total) by mouth 4 (four) times daily -  with meals and at bedtime for 10 days. 11/12/20 11/22/20  Henderly, Britni A, PA-C    Allergies    Aspirin, Codeine, Hydrocodone, Oxycodone-acetaminophen, and Tape  Review of Systems   Review of Systems  All other systems reviewed and are negative.  Physical Exam Updated Vital Signs BP (!) 144/78 (BP Location: Right Arm)   Pulse 77   Temp 98 F (36.7 C) (Oral)   Resp 18   Ht '5\' 6"'$  (1.676 m)   Wt 84.8 kg   SpO2 98%   BMI 30.18 kg/m   Physical Exam Vitals reviewed.  Cardiovascular:     Rate and Rhythm: Normal rate.  Pulmonary:     Effort: Pulmonary effort is normal.  Musculoskeletal:        General: Swelling and tenderness present.     Comments: Swollen tender right wrist,  pain with movement  nv and ns intact   Skin:    General: Skin is warm.   Neurological:     General: No focal deficit present.     Mental Status: She is alert.  Psychiatric:        Mood and Affect: Mood normal.    ED Results / Procedures / Treatments   Labs (all labs ordered are listed, but only abnormal results are displayed) Labs Reviewed - No data to display  EKG None  Radiology DG Wrist Complete Right  Result Date: 04/04/2021 CLINICAL DATA:  Right wrist pain. EXAM: RIGHT WRIST - COMPLETE 3+ VIEW COMPARISON:  None. FINDINGS: Mild diffuse soft tissue swelling about the wrist. No associated fracture or dislocation. Mild degenerative change involving the STT joints of the base of the thumb with joint space loss, subchondral sclerosis and osteophytosis. Mild degenerative change involving the fourth MCP joint with joint space loss, subchondral sclerosis and osteophytosis. No discrete erosions. No evidence of chondrocalcinosis. No radiopaque foreign body. IMPRESSION: 1. Mild soft tissue swelling about the wrist without associated fracture or dislocation. 2. Mild degenerative change of the base of the thumb and fourth MCP joint Electronically Signed   By: Sandi Mariscal M.D.   On: 04/04/2021 10:03    Procedures Procedures   Medications Ordered in ED Medications - No data to display  ED Course  I have reviewed the triage vital signs and the nursing notes.  Pertinent labs & imaging results that were available during my care of the patient were reviewed by me and considered in my medical decision making (see chart for details).    MDM Rules/Calculators/A&P                           MDM:  Pt counseled on repetive motion injury and degenerative arthritis.  Pt placed in a splint.  Pt advised tylenol and follow up with Dr. Fredna Dow if pain persist  Final Clinical Impression(s) / ED Diagnoses Final diagnoses:  Right wrist pain    Rx / DC Orders ED Discharge Orders     None     An After Visit Summary was printed and given to the patient.    Jacqueline Haney, [name] 04/04/21 1554    Kommor, Jacqueline Coder, MD 04/04/21 [phone]

## 2021-04-30 ENCOUNTER — Encounter: Payer: Self-pay | Admitting: Orthopaedic Surgery

## 2021-04-30 ENCOUNTER — Other Ambulatory Visit: Payer: Self-pay

## 2021-04-30 ENCOUNTER — Ambulatory Visit: Payer: Medicare PPO | Admitting: Orthopaedic Surgery

## 2021-04-30 VITALS — BP 144/89 | HR 78 | Ht 66.0 in | Wt 181.0 lb

## 2021-04-30 DIAGNOSIS — M654 Radial styloid tenosynovitis [de Quervain]: Secondary | ICD-10-CM

## 2021-04-30 NOTE — Progress Notes (Signed)
My right wrist hurts.  She was seen in the ER recently for right wrist pain.  She had X-rays done.  She was given a splint.  She has had pain in the first extensor compartment area for several weeks getting worse.  She is going back to school for advanced degree and is writing more than usual.  She has no trauma, no numbness but has some swelling.  She is very tender over the first extensor compartment on the right. ROM of the thumb is tender and she has some swelling.  Wynn Maudlin sign is very positive.  NV intact.  Encounter Diagnosis  Name Primary?   Radial styloid tenosynovitis (de quervain) Yes   Procedure note: After permission from the patient, the first extensor compartment on the right was prepped and I injected 1 % Xylocaine and Celestone 6 mgm into the tendon sheath by sterile technique well tolerated.  A thumb splint is given. She cannot take NSAIDs.  Return in two weeks.  Call if any problem.  Precautions discussed.  Electronically Signed Sanjuana Kava, MD 9/15/20229:35 AM

## 2021-05-14 ENCOUNTER — Ambulatory Visit: Payer: Medicare PPO | Admitting: Orthopaedic Surgery

## 2021-05-19 ENCOUNTER — Encounter (HOSPITAL_COMMUNITY): Payer: Self-pay | Admitting: *Deleted

## 2021-05-19 ENCOUNTER — Other Ambulatory Visit: Payer: Self-pay

## 2021-05-19 ENCOUNTER — Emergency Department (HOSPITAL_COMMUNITY)
Admission: EM | Admit: 2021-05-19 | Discharge: 2021-05-19 | Disposition: A | Discharge status: Home / Self Care | Payer: Medicare PPO | Source: Home / Self Care | Attending: Emergency Medicine | Admitting: Emergency Medicine

## 2021-05-19 ENCOUNTER — Ambulatory Visit: Payer: Medicare PPO | Admitting: Orthopaedic Surgery

## 2021-05-19 ENCOUNTER — Encounter (HOSPITAL_COMMUNITY): Payer: Self-pay

## 2021-05-19 ENCOUNTER — Encounter: Payer: Self-pay | Admitting: Orthopaedic Surgery

## 2021-05-19 ENCOUNTER — Emergency Department (HOSPITAL_COMMUNITY)
Admission: EM | Admit: 2021-05-19 | Discharge: 2021-05-19 | Disposition: A | Discharge status: Home / Self Care | Payer: Medicare PPO | Attending: Emergency Medicine | Admitting: Emergency Medicine

## 2021-05-19 VITALS — BP 140/84 | HR 88 | Ht 66.0 in | Wt 179.4 lb

## 2021-05-19 DIAGNOSIS — W57XXXA Bitten or stung by nonvenomous insect and other nonvenomous arthropods, initial encounter: Secondary | ICD-10-CM | POA: Insufficient documentation

## 2021-05-19 DIAGNOSIS — M25522 Pain in left elbow: Secondary | ICD-10-CM | POA: Insufficient documentation

## 2021-05-19 DIAGNOSIS — D72829 Elevated white blood cell count, unspecified: Secondary | ICD-10-CM | POA: Insufficient documentation

## 2021-05-19 DIAGNOSIS — R519 Headache, unspecified: Secondary | ICD-10-CM | POA: Insufficient documentation

## 2021-05-19 DIAGNOSIS — T63481A Toxic effect of venom of other arthropod, accidental (unintentional), initial encounter: Secondary | ICD-10-CM

## 2021-05-19 DIAGNOSIS — M79601 Pain in right arm: Secondary | ICD-10-CM

## 2021-05-19 DIAGNOSIS — S4992XA Unspecified injury of left shoulder and upper arm, initial encounter: Secondary | ICD-10-CM | POA: Diagnosis present

## 2021-05-19 DIAGNOSIS — F17210 Nicotine dependence, cigarettes, uncomplicated: Secondary | ICD-10-CM | POA: Insufficient documentation

## 2021-05-19 DIAGNOSIS — S50862A Insect bite (nonvenomous) of left forearm, initial encounter: Secondary | ICD-10-CM | POA: Insufficient documentation

## 2021-05-19 DIAGNOSIS — M654 Radial styloid tenosynovitis [de Quervain]: Secondary | ICD-10-CM

## 2021-05-19 LAB — COMPREHENSIVE METABOLIC PANEL
ALT: 19 U/L (ref 0–44)
AST: 17 U/L (ref 15–41)
Albumin: 3.8 g/dL (ref 3.5–5.0)
Alkaline Phosphatase: 89 U/L (ref 38–126)
Anion gap: 9 (ref 5–15)
BUN: 12 mg/dL (ref 8–23)
CO2: 23 mmol/L (ref 22–32)
Calcium: 9.1 mg/dL (ref 8.9–10.3)
Chloride: 107 mmol/L (ref 98–111)
Creatinine, Ser: 0.69 mg/dL (ref 0.44–1.00)
GFR, Estimated: >60 mL/min (ref >60)
Glucose, Bld: 134 mg/dL — ABNORMAL HIGH (ref 70–99)
Potassium: 3.3 mmol/L — ABNORMAL LOW (ref 3.5–5.1)
Sodium: 139 mmol/L (ref 135–145)
Total Bilirubin: 0.5 mg/dL (ref 0.3–1.2)
Total Protein: 7.3 g/dL (ref 6.5–8.1)

## 2021-05-19 LAB — CBC
HCT: 42.8 % (ref 36.0–46.0)
Hemoglobin: 14.9 g/dL (ref 12.0–15.0)
MCH: 30.6 pg (ref 26.0–34.0)
MCHC: 34.8 g/dL (ref 30.0–36.0)
MCV: 87.9 fL (ref 80.0–100.0)
Platelets: 361 10*3/uL (ref 150–400)
RBC: 4.87 MIL/uL (ref 3.87–5.11)
RDW: 14.2 % (ref 11.5–15.5)
WBC: 17.7 10*3/uL — ABNORMAL HIGH (ref 4.0–10.5)
nRBC: 0 % (ref 0.0–0.2)

## 2021-05-19 MED ORDER — AMOXICILLIN-POT CLAVULANATE 875-125 MG PO TABS: 1.0000 | ORAL_TABLET | Freq: Two times a day (BID) | ORAL | 0 refills | Status: DC

## 2021-05-19 MED ORDER — PREDNISONE 50 MG PO TABS
60.0000 mg | ORAL_TABLET | Freq: Once | ORAL | Status: AC
  Administered 2021-05-19: 60 mg via ORAL
  Filled 2021-05-19: qty 1

## 2021-05-19 MED ORDER — PREDNISONE 20 MG PO TABS: 20.0000 mg | ORAL_TABLET | Freq: Two times a day (BID) | ORAL | 0 refills | Status: DC

## 2021-05-19 MED ORDER — AMOXICILLIN-POT CLAVULANATE 875-125 MG PO TABS
1.0000 | ORAL_TABLET | Freq: Once | ORAL | Status: AC
  Administered 2021-05-19: 1 via ORAL
  Filled 2021-05-19: qty 1

## 2021-05-19 NOTE — Progress Notes (Signed)
I am much better.  Her deQuervain's on the right is much improved after the injection last time. She has full ROM and negative Finkelstein sign.  NV intact.  Encounter Diagnosis  Name Primary?   Radial styloid tenosynovitis (de quervain) Yes   I will see as needed.  Call if any problem.  Precautions discussed.  Electronically Signed Sanjuana Kava, MD 10/4/20221:39 PM

## 2021-05-19 NOTE — Discharge Instructions (Signed)
It appears that you have either an infection or insect sting of the left arm causing the redness and swelling.  Try using a warm compress on it 3-4 times a day.  We are treating you with prednisone and antibiotic, Augmentin for possible infection.  If you feel like you are itching or develop a rash, use Benadryl, 4 times a day, which is an antihistamine.  See your doctor if not improving in the next few days.  Return here if needed.

## 2021-05-19 NOTE — ED Triage Notes (Signed)
Pt. States their left arm has been swollen, red, warm and pain since yesterday.

## 2021-05-19 NOTE — ED Notes (Signed)
Pt's second visit today for swelling to left upper arm, warm, pain, pt thinks she was bitten by an insect. Pt left earlier b/c she had another doctor appt. Pt encouraged to stay this time.

## 2021-05-19 NOTE — ED Triage Notes (Signed)
Pt c/o left arm swelling, redness, warmth and pain since yesterday. Denies fever.

## 2021-05-19 NOTE — ED Notes (Signed)
Pt wanting to leave, pt has doctor appt, encouraged pt to stay

## 2021-05-19 NOTE — ED Provider Notes (Signed)
[facility] EMERGENCY DEPARTMENT Provider Note   CSN: [id number] Arrival date & time: 05/19/21  1905     History Chief Complaint  Patient presents with   Arm Pain    Jacqueline Haney is a 65 y.o. female.  HPI She presents for evaluation of swelling and redness in her left arm.  Onset yesterday, worse today.  She is not sure about any trauma including insect sting.  She denies fever, weakness or dizziness.  She works part-time as a Therapist, art.  No prior similar problems.  She also complains of a headache which feels like a migraine for which she usually takes sumatriptan.  There are no other known modifying factors.    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[place])    Migraine     Patient Active Problem List   Diagnosis Date Noted   Duodenitis    Abdominal pain, epigastric    Median arcuate ligament syndrome (HCC)    Excessive use of nonsteroidal anti-inflammatory drugs (NSAIDs)    Retroperitoneal bleed 02/08/2018   Family history of coronary artery disease 09/18/2014   Chest pain at rest 09/17/2014   Obesity 09/17/2014   Tobacco abuse 09/17/2014   Chest pain 09/17/2014   DERANGEMENT MENISCUS 09/16/2009   KNEE PAIN 09/16/2009   SHOULDER PAIN 01/10/2008   CERVICAL RADICULITIS 01/10/2008   CERVICAL SPASM 01/10/2008    Past Surgical History:  Procedure Laterality Date   ABDOMINAL HYSTERECTOMY     BIOPSY  02/10/2018   Procedure: BIOPSY;  Surgeon: Gatha Mayer, MD;  Location: [facility] ENDOSCOPY;  Service: Endoscopy;;   ESOPHAGOGASTRODUODENOSCOPY (EGD) WITH PROPOFOL N/A 02/10/2018   Procedure: ESOPHAGOGASTRODUODENOSCOPY (EGD) WITH PROPOFOL;  Surgeon: Gatha Mayer, MD;  Location: [facility] ENDOSCOPY;  Service: Endoscopy;  Laterality: N/A;   SPLENECTOMY       OB History     Gravida  2   Para  2   Term  2   Preterm      AB       Living         SAB      IAB      Ectopic      Multiple      Live Births              Family History  Problem Relation Age of Onset   Cancer Mother    Cancer Father    Kidney failure Sister    Heart attack Sister    Cancer Brother    Cancer Other     Social History   Tobacco Use   Smoking status: Every Day    Packs/day: 0.50    Types: Cigarettes   Smokeless tobacco: Never  Vaping Use   Vaping Use: Never used  Substance Use Topics   Alcohol use: No   Drug use: No    Home Medications Prior to Admission medications   Medication Sig Start Date End Date Taking? Authorizing Provider  amoxicillin-clavulanate (AUGMENTIN) 875-125 MG tablet Take 1 tablet by mouth 2 (two) times daily. One po bid x 7 days 05/19/21  Yes Daleen Bo, MD  predniSONE (DELTASONE) 20 MG tablet Take 1 tablet (20 mg total) by mouth 2 (two) times daily. 05/19/21  Yes Daleen Bo, MD  gabapentin (NEURONTIN) 100 MG capsule Take 1 capsule (100 mg total) by mouth  3 (three) times daily. 11/24/20   Carole Civil, MD  pantoprazole (PROTONIX) 20 MG tablet Take 1 tablet (20 mg total) by mouth daily. 11/12/20 12/12/20  Henderly, Britni A, PA-C  rizatriptan (MAXALT) 10 MG tablet SMARTSIG:1 Tablet(s) By Mouth 12/03/20   [provider]  sucralfate (CARAFATE) 1 g tablet Take 1 tablet (1 g total) by mouth 4 (four) times daily -  with meals and at bedtime for 10 days. 11/12/20 11/22/20  Henderly, Britni A, PA-C    Allergies    Aspirin, Codeine, Hydrocodone, Oxycodone-acetaminophen, and Tape  Review of Systems   Review of Systems  All other systems reviewed and are negative.  Physical Exam Updated Vital Signs BP 130/88   Pulse 79   Temp 99.5 F (37.5 C) (Oral)   Resp 16   Ht 5\' 6"  (1.676 m)   Wt 81.4 kg   SpO2 98%   BMI 28.95 kg/m   Physical Exam Vitals and nursing note reviewed.  Constitutional:      General: She is not in acute distress.    Appearance: She is well-developed. She  is not ill-appearing or diaphoretic.  HENT:     Head: Normocephalic and atraumatic.     Right Ear: External ear normal.     Left Ear: External ear normal.  Eyes:     Conjunctiva/sclera: Conjunctivae normal.     Pupils: Pupils are equal, round, and reactive to light.  Neck:     Trachea: Phonation normal.  Cardiovascular:     Rate and Rhythm: Normal rate and regular rhythm.     Heart sounds: Normal heart sounds.  Pulmonary:     Effort: Pulmonary effort is normal.     Breath sounds: Normal breath sounds.  Abdominal:     Palpations: Abdomen is soft.     Tenderness: There is no abdominal tenderness.  Musculoskeletal:        General: Swelling and tenderness present. Normal range of motion.     Cervical back: Normal range of motion and neck supple.     Comments: Able to move left arm, and nearly normal range of motion.  Mild pain to palpation over the left medial elbow where there is an area of erythema extending from the mid upper arm, to the mid forearm.  There are some areas, raised in the middle, punctate with central firm induration consistent with local inflammatory process, possibly spider bite.  No visible puncture.  No visible foreign body.  No proximal streaking.  Skin:    General: Skin is warm and dry.     Coloration: Skin is not jaundiced or pale.  Neurological:     Mental Status: She is alert and oriented to person, place, and time.     Cranial Nerves: No cranial nerve deficit.     Sensory: No sensory deficit.     Motor: No abnormal muscle tone.     Coordination: Coordination normal.  Psychiatric:        Behavior: Behavior normal.        Thought Content: Thought content normal.        Judgment: Judgment normal.    ED Results / Procedures / Treatments   Labs (all labs ordered are listed, but only abnormal results are displayed) Labs Reviewed  CBC - Abnormal; Notable for the following components:      Result Value   WBC 17.7 (*)    All other components within normal  limits  COMPREHENSIVE METABOLIC PANEL - Abnormal; Notable for the  following components:   Potassium 3.3 (*)    Glucose, Bld 134 (*)    All other components within normal limits    EKG None  Radiology No results found.  Procedures Procedures   Medications Ordered in ED Medications  predniSONE (DELTASONE) tablet 60 mg (60 mg Oral Given 05/19/21 2257)  amoxicillin-clavulanate (AUGMENTIN) 875-125 MG per tablet 1 tablet (1 tablet Oral Given 05/19/21 2257)    ED Course  I have reviewed the triage vital signs and the nursing notes.  Pertinent labs & imaging results that were available during my care of the patient were reviewed by me and considered in my medical decision making (see chart for details).    MDM Rules/Calculators/A&P                            Patient Vitals for the past 24 hrs:  BP Temp Temp src Pulse Resp SpO2 Height Weight  05/19/21 2259 130/88 99.5 F (37.5 C) Oral -- 16 98 % -- --  05/19/21 2200 130/89 98.8 F (37.1 C) Oral 79 20 100 % -- --  05/19/21 1943 -- -- -- -- -- -- 5\' 6"  (1.676 m) 81.4 kg  05/19/21 1941 129/74 98.4 F (36.9 C) Oral 91 18 98 % -- --  05/19/21 1940 -- 98.4 F (36.9 C) Oral -- -- -- -- --    10:44 PM Reevaluation with update and discussion. After initial assessment and treatment, an updated evaluation reveals no additional complaints, findings discussed and questions answered. Daleen Bo   Medical Decision Making:  This patient is presenting for evaluation of pain and redness, left arm, which does require a range of treatment options, and is a complaint that involves a moderate risk of morbidity and mortality. The differential diagnoses include cellulitis, insect sting with local inflammatory response. I decided to review old records, and in summary elderly female presenting with unclear cause for left arm discomfort, she has a history of cervical radiculitis, Bacot abuse, and duodenitis.  I did not require additional historical  information from anyone.  Clinical Laboratory Tests Ordered, included CBC and Metabolic panel. Review indicates normal except white count high, glucose high, potassium slightly low.    Critical Interventions-clinical evaluation, medication treatment  After These Interventions, the Patient was reevaluated and was found with cellulitis versus insect sting of the left arm.  Treatment with Augmentin and prednisone begun in the ED.  CRITICAL CARE-no Performed by: Daleen Bo  Nursing Notes Reviewed/ Care Coordinated Applicable Imaging Reviewed Interpretation of Laboratory Data incorporated into ED treatment  The patient appears reasonably screened and/or stabilized for discharge and I doubt any other medical condition or other [facility] requiring further screening, evaluation, or treatment in the ED at this time prior to discharge.  Plan: Home Medications-continue usual, Tylenol if needed for pain or fever; Home Treatments-heat to affected area 3 times daily; return here if the recommended treatment, does not improve the symptoms; Recommended follow up-PCP follow-up 1 week and as needed     Final Clinical Impression(s) / ED Diagnoses Final diagnoses:  Right arm pain  Insect stings, accidental or unintentional, initial encounter    Rx / DC Orders ED Discharge Orders          Ordered    amoxicillin-clavulanate (AUGMENTIN) 875-125 MG tablet  2 times daily        05/19/21 2245    predniSONE (DELTASONE) 20 MG tablet  2 times daily  05/19/21 2245             Daleen Bo, MD 05/20/21 1134

## 2021-06-20 ENCOUNTER — Emergency Department (HOSPITAL_COMMUNITY): Payer: Medicare PPO

## 2021-06-20 ENCOUNTER — Emergency Department (HOSPITAL_COMMUNITY)
Admission: EM | Admit: 2021-06-20 | Discharge: 2021-06-20 | Disposition: A | Discharge status: Home / Self Care | Payer: Medicare PPO | Attending: Emergency Medicine | Admitting: Emergency Medicine

## 2021-06-20 ENCOUNTER — Encounter (HOSPITAL_COMMUNITY): Payer: Self-pay | Admitting: Emergency Medicine

## 2021-06-20 ENCOUNTER — Other Ambulatory Visit: Payer: Self-pay

## 2021-06-20 DIAGNOSIS — M25551 Pain in right hip: Secondary | ICD-10-CM | POA: Insufficient documentation

## 2021-06-20 DIAGNOSIS — M5441 Lumbago with sciatica, right side: Secondary | ICD-10-CM

## 2021-06-20 DIAGNOSIS — F17210 Nicotine dependence, cigarettes, uncomplicated: Secondary | ICD-10-CM | POA: Insufficient documentation

## 2021-06-20 DIAGNOSIS — M5431 Sciatica, right side: Secondary | ICD-10-CM | POA: Insufficient documentation

## 2021-06-20 MED ORDER — KETOROLAC TROMETHAMINE 60 MG/2ML IM SOLN
60.0000 mg | Freq: Once | INTRAMUSCULAR | Status: AC
  Administered 2021-06-20: 60 mg via INTRAMUSCULAR
  Filled 2021-06-20: qty 2

## 2021-06-20 MED ORDER — PREDNISONE 50 MG PO TABS: ORAL_TABLET | ORAL | 0 refills | Status: DC

## 2021-06-20 NOTE — ED Provider Notes (Signed)
[facility] EMERGENCY DEPARTMENT Provider Note   CSN: [id number] Arrival date & time: 06/20/21  1233     History Chief Complaint  Patient presents with   Hip Pain    Jacqueline Haney is a 65 y.o. female.  The history is provided by the patient. No language interpreter was used.  Hip Pain This is a new problem. The current episode started yesterday. The problem occurs constantly. The problem has not changed since onset.Nothing aggravates the symptoms. Nothing relieves the symptoms. She has tried nothing for the symptoms. The treatment provided no relief.  Pt complains of pain in her right hip and low back.  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[place])    Migraine     Patient Active Problem List   Diagnosis Date Noted   Duodenitis    Abdominal pain, epigastric    Median arcuate ligament syndrome (HCC)    Excessive use of nonsteroidal anti-inflammatory drugs (NSAIDs)    Retroperitoneal bleed 02/08/2018   Family history of coronary artery disease 09/18/2014   Chest pain at rest 09/17/2014   Obesity 09/17/2014   Tobacco abuse 09/17/2014   Chest pain 09/17/2014   DERANGEMENT MENISCUS 09/16/2009   KNEE PAIN 09/16/2009   SHOULDER PAIN 01/10/2008   CERVICAL RADICULITIS 01/10/2008   CERVICAL SPASM 01/10/2008    Past Surgical History:  Procedure Laterality Date   ABDOMINAL HYSTERECTOMY     BIOPSY  02/10/2018   Procedure: BIOPSY;  Surgeon: Gatha Mayer, MD;  Location: [place];  Service: Endoscopy;;   ESOPHAGOGASTRODUODENOSCOPY (EGD) WITH PROPOFOL N/A 02/10/2018   Procedure: ESOPHAGOGASTRODUODENOSCOPY (EGD) WITH PROPOFOL;  Surgeon: Gatha Mayer, MD;  Location: [facility] ENDOSCOPY;  Service: Endoscopy;  Laterality: N/A;   SPLENECTOMY       OB History     Gravida  2    Para  2   Term  2   Preterm      AB      Living         SAB      IAB      Ectopic      Multiple      Live Births              Family History  Problem Relation Age of Onset   Cancer Mother    Cancer Father    Kidney failure Sister    Heart attack Sister    Cancer Brother    Cancer Other     Social History   Tobacco Use   Smoking status: Every Day    Packs/day: 0.50    Types: Cigarettes   Smokeless tobacco: Never  Vaping Use   Vaping Use: Never used  Substance Use Topics   Alcohol use: No   Drug use: No    Home Medications Prior to Admission medications   Medication Sig Start Date End Date Taking? Authorizing Provider  amoxicillin-clavulanate (AUGMENTIN) 875-125 MG tablet Take 1 tablet by mouth 2 (two) times daily. One po bid x 7 days 05/19/21   Daleen Bo, MD  gabapentin (NEURONTIN) 100 MG capsule Take 1 capsule (100 mg total) by mouth 3 (three) times daily. 11/24/20   Carole Civil, MD  pantoprazole (PROTONIX) 20 MG  tablet Take 1 tablet (20 mg total) by mouth daily. 11/12/20 12/12/20  Henderly, Britni A, PA-C  predniSONE (DELTASONE) 20 MG tablet Take 1 tablet (20 mg total) by mouth 2 (two) times daily. 05/19/21   Daleen Bo, MD  rizatriptan (MAXALT) 10 MG tablet SMARTSIG:1 Tablet(s) By Mouth 12/03/20   [provider]  sucralfate (CARAFATE) 1 g tablet Take 1 tablet (1 g total) by mouth 4 (four) times daily -  with meals and at bedtime for 10 days. 11/12/20 11/22/20  Henderly, Britni A, PA-C    Allergies    Aspirin, Codeine, Hydrocodone, Oxycodone-acetaminophen, and Tape  Review of Systems   Review of Systems  All other systems reviewed and are negative.  Physical Exam Updated Vital Signs BP (!) 149/86 (BP Location: Right Arm)   Pulse 92   Temp 97.8 F (36.6 C) (Oral)   Resp 20   Ht 5\' 6"  (1.676 m)   Wt 80.3 kg   SpO2 99%   BMI 28.57 kg/m   Physical Exam Vitals reviewed.  Constitutional:      Appearance: Normal  appearance.  Cardiovascular:     Rate and Rhythm: Normal rate.  Pulmonary:     Effort: Pulmonary effort is normal.  Abdominal:     General: Abdomen is flat.  Musculoskeletal:        General: Tenderness present. No swelling or deformity.     Comments: Ender lower lumbar/sacral spine,  tender right hip,  Pt has difficulty walking    Skin:    General: Skin is warm.  Neurological:     General: No focal deficit present.     Mental Status: She is alert.  Psychiatric:        Mood and Affect: Mood normal.    ED Results / Procedures / Treatments   Labs (all labs ordered are listed, but only abnormal results are displayed) Labs Reviewed - No data to display  EKG None  Radiology DG Lumbar Spine Complete  Result Date: 06/20/2021 CLINICAL DATA:  Lower back and right hip pain since this morning. No known injury. EXAM: LUMBAR SPINE - COMPLETE 4+ VIEW COMPARISON:  Lumbar spine radiograph 10/28/2017 FINDINGS: There are 5 lumbar-type vertebral bodies. There is no evidence of lumbar spine fracture. Alignment is normal. Mild degenerative disc disease at L3-L4 and L4-L5 with loss of disc space height and tiny anterior osteophytes, not significantly changed compared to 10/28/2017. Bilateral hypertrophic facet arthropathy at L4-S1. Surgical clips project at the left upper abdomen. Surgical staples noted in the pelvis. IMPRESSION: No fracture or traumatic malalignment of the lumbar spine. Multilevel mild degenerative disc disease, not significantly changed compared to 10/28/2017. Electronically Signed   By: Ileana Roup M.D.   On: 06/20/2021 15:31   DG Hip Unilat W or Wo Pelvis 2-3 Views Right  Result Date: 06/20/2021 CLINICAL DATA:  Lower back and right hip pain since this morning. EXAM: DG HIP (WITH OR WITHOUT PELVIS) 2-3V RIGHT COMPARISON:  Right femur radiograph 06/22/2015 FINDINGS: There is no evidence of hip fracture or dislocation. There is no evidence of arthropathy or other focal bone abnormality.  IMPRESSION: Negative. Electronically Signed   By: Ileana Roup M.D.   On: 06/20/2021 15:32    Procedures Procedures   Medications Ordered in ED Medications  ketorolac (TORADOL) injection 60 mg (60 mg Intramuscular Given 06/20/21 1514)    ED Course  I have reviewed the triage vital signs and the nursing notes.  Pertinent labs & imaging results that were available during  my care of the patient were reviewed by me and considered in my medical decision making (see chart for details).    MDM Rules/Calculators/A&P                           MDM"  Pt given toradol IM.  Xray hip no acute LS spine shows multi level degenerative changes Final Clinical Impression(s) / ED Diagnoses Final diagnoses:  Right hip pain  Acute low back pain with right-sided sciatica, unspecified back pain laterality    Rx / DC Orders ED Discharge Orders          Ordered    predniSONE (DELTASONE) 50 MG tablet        06/20/21 1610          An After Visit Summary was printed and given to the patient.    [name], [name] 06/20/21 1612    Milton Ferguson, MD 06/23/21 1712

## 2021-06-20 NOTE — Discharge Instructions (Addendum)
Tylenol for pain. See your Physician for recheck in 2-3 days

## 2021-06-20 NOTE — ED Triage Notes (Signed)
Pt to the ED with complaints of right hip pain.

## 2021-07-02 ENCOUNTER — Other Ambulatory Visit (HOSPITAL_COMMUNITY): Payer: Self-pay | Admitting: Family Medicine

## 2021-07-02 DIAGNOSIS — Z1231 Encounter for screening mammogram for malignant neoplasm of breast: Secondary | ICD-10-CM

## 2021-07-14 ENCOUNTER — Encounter: Payer: Self-pay | Admitting: Orthopaedic Surgery

## 2021-07-14 ENCOUNTER — Other Ambulatory Visit: Payer: Self-pay

## 2021-07-14 ENCOUNTER — Ambulatory Visit (INDEPENDENT_AMBULATORY_CARE_PROVIDER_SITE_OTHER): Payer: Medicare PPO | Admitting: Orthopaedic Surgery

## 2021-07-14 VITALS — BP 132/86 | HR 88 | Ht 66.0 in | Wt 180.0 lb

## 2021-07-14 DIAGNOSIS — M654 Radial styloid tenosynovitis [de Quervain]: Secondary | ICD-10-CM | POA: Diagnosis not present

## 2021-07-14 NOTE — Progress Notes (Signed)
She has recurrent Alfonse Ras on the right.  First extensor compartment is very tender.  Positive Finkelstein sign.  Procedure note: After prep of the right first extensor compartment, the area was injected with 1% Plain Xylocaine and 1 cc DepoMedrol by sterile technique tolerated well.  Use the thumb splint.  Encounter Diagnosis  Name Primary?   Radial styloid tenosynovitis (de quervain) Yes   Return in two weeks.  Call if any problem.  Use Aspercreme. She cannot take NSAIDs.  Precautions discussed.  Electronically Signed Sanjuana Kava, MD 11/29/20222:02 PM

## 2021-07-28 ENCOUNTER — Ambulatory Visit: Payer: Medicare PPO | Admitting: Orthopaedic Surgery

## 2021-08-04 ENCOUNTER — Ambulatory Visit: Payer: Medicare PPO | Admitting: Orthopaedic Surgery

## 2021-08-04 ENCOUNTER — Encounter: Payer: Self-pay | Admitting: Orthopaedic Surgery

## 2021-08-04 ENCOUNTER — Other Ambulatory Visit: Payer: Self-pay

## 2021-08-04 DIAGNOSIS — M654 Radial styloid tenosynovitis [de Quervain]: Secondary | ICD-10-CM

## 2021-08-04 NOTE — Progress Notes (Signed)
My hand if OK.  She has no further problems with the right hand or wrist.  NV intact. Full ROM.  Encounter Diagnosis  Name Primary?   Radial styloid tenosynovitis (de quervain) Yes   I will see as needed.  Call if any problem.  Precautions discussed.  Electronically Signed Sanjuana Kava, MD 12/20/20222:31 PM

## 2021-10-29 ENCOUNTER — Other Ambulatory Visit: Payer: Self-pay

## 2021-10-29 ENCOUNTER — Ambulatory Visit (HOSPITAL_COMMUNITY)
Admission: RE | Admit: 2021-10-29 | Discharge: 2021-10-29 | Disposition: A | Discharge status: Home / Self Care | Payer: Medicare PPO | Source: Ambulatory Visit | Attending: Family Medicine | Admitting: Family Medicine

## 2021-10-29 DIAGNOSIS — Z1231 Encounter for screening mammogram for malignant neoplasm of breast: Secondary | ICD-10-CM | POA: Diagnosis present

## 2021-12-14 ENCOUNTER — Ambulatory Visit: Payer: Medicare PPO

## 2021-12-14 ENCOUNTER — Encounter: Payer: Self-pay | Admitting: Orthopedic Surgery

## 2021-12-14 ENCOUNTER — Ambulatory Visit: Payer: Medicare PPO | Admitting: Orthopedic Surgery

## 2021-12-14 ENCOUNTER — Ambulatory Visit (INDEPENDENT_AMBULATORY_CARE_PROVIDER_SITE_OTHER): Payer: Medicare PPO

## 2021-12-14 VITALS — BP 154/96 | HR 86 | Ht 66.0 in | Wt 178.6 lb

## 2021-12-14 DIAGNOSIS — S60222A Contusion of left hand, initial encounter: Secondary | ICD-10-CM

## 2021-12-14 DIAGNOSIS — M79642 Pain in left hand: Secondary | ICD-10-CM

## 2021-12-14 DIAGNOSIS — S60221A Contusion of right hand, initial encounter: Secondary | ICD-10-CM

## 2021-12-14 NOTE — Patient Instructions (Signed)
Follow-up in 2 weeks

## 2021-12-14 NOTE — Progress Notes (Signed)
Chief Complaint  ?Patient presents with  ? Hand Pain  ?  LT hand, got hand caught between two glass doors at [employer].  ?DOI 12/05/21  ? ? ?HPI: Jacqueline Haney is 66 years old she was at a restaurant her hand was caught between 2 glass door she complains of pain on the dorsum of the hand ? ?Date of injury April 22 ? ?There is pain and swelling in this area ? ?Past Medical History:  ?Diagnosis Date  ? Chest pain   ? ACS rules out stress test negative  ? Hyperlipidemia   ? refuses meds/diet controlled  ? 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 Migraine   ? ? ?BP (!) 154/96   Pulse 86   Ht '5\' 6"'$  (1.676 m)   Wt 178 lb 9.6 oz (81 kg)   BMI 28.83 kg/m?  ? ? ?General appearance: Well-developed well-nourished no gross deformities ? ?Cardiovascular normal pulse and perfusion normal color without edema ? ?Neurologically no sensation loss or deficits or pathologic reflexes ? ?Psychological: Awake alert and oriented x3 mood and affect normal ? ?Skin no lacerations or ulcerations no nodularity no palpable masses, no erythema or nodularity ? ?Musculoskeletal: Tenderness on the dorsum of the hand no tenderness in the snuffbox or scaphoid tubercle ? ?Imaging today's images show no fracture or dislocation may be some mild arthritis there is questionable radiolucency near the scaphoid ? ?A/P ? ?Recommend splinting and repeat x-ray in 2 weeks ?

## 2022-01-04 ENCOUNTER — Ambulatory Visit (INDEPENDENT_AMBULATORY_CARE_PROVIDER_SITE_OTHER): Payer: Medicare PPO

## 2022-01-04 ENCOUNTER — Ambulatory Visit: Payer: Medicare PPO | Admitting: Orthopedic Surgery

## 2022-01-04 DIAGNOSIS — M79642 Pain in left hand: Secondary | ICD-10-CM

## 2022-01-04 NOTE — Patient Instructions (Signed)
While we are working on your approval forCT please go ahead and call to schedule your appointment with [facility] within at least one (1) week.   Central Scheduling [phone]  

## 2022-01-04 NOTE — Progress Notes (Signed)
Jacqueline Haney comes back after injuring her left hand back in May 3 weeks ago we got x-rays that were negative we repeated the x-rays they look to be negative but her pain is still rather significant especially in the snuffbox and wrist joint  We recommend CT scan to evaluate the scaphoid to be sure there is no occult fracture

## 2022-01-13 ENCOUNTER — Telehealth: Payer: Self-pay | Admitting: Orthopedic Surgery

## 2022-01-13 NOTE — Telephone Encounter (Signed)
Patient called and left a voicemail stating she has made her appt for her CT.  I called the patient back to schedule appointment with Dr. Aline Brochure, she doesn't have voicemail set up yet.

## 2022-01-27 ENCOUNTER — Ambulatory Visit (HOSPITAL_COMMUNITY)
Admission: RE | Admit: 2022-01-27 | Discharge: 2022-01-27 | Disposition: A | Discharge status: Home / Self Care | Payer: Medicare PPO | Source: Ambulatory Visit | Attending: Orthopedic Surgery | Admitting: Orthopedic Surgery

## 2022-01-27 DIAGNOSIS — M79642 Pain in left hand: Secondary | ICD-10-CM | POA: Diagnosis present

## 2022-02-01 ENCOUNTER — Ambulatory Visit: Payer: Medicare PPO | Admitting: Orthopedic Surgery

## 2022-02-15 ENCOUNTER — Encounter: Payer: Self-pay | Admitting: Orthopedic Surgery

## 2022-03-01 ENCOUNTER — Ambulatory Visit: Payer: Medicare PPO | Admitting: Orthopedic Surgery

## 2022-03-01 ENCOUNTER — Encounter: Payer: Self-pay | Admitting: Orthopedic Surgery

## 2022-03-01 DIAGNOSIS — M79642 Pain in left hand: Secondary | ICD-10-CM | POA: Diagnosis not present

## 2022-03-01 DIAGNOSIS — M79641 Pain in right hand: Secondary | ICD-10-CM | POA: Diagnosis not present

## 2022-03-01 DIAGNOSIS — S60222D Contusion of left hand, subsequent encounter: Secondary | ICD-10-CM | POA: Diagnosis not present

## 2022-03-01 DIAGNOSIS — M654 Radial styloid tenosynovitis [de Quervain]: Secondary | ICD-10-CM

## 2022-03-01 MED ORDER — METHYLPREDNISOLONE ACETATE 40 MG/ML IJ SUSP
40.0000 mg | Freq: Once | INTRAMUSCULAR | Status: AC
  Administered 2022-03-01: 40 mg via INTRA_ARTICULAR

## 2022-03-01 NOTE — Patient Instructions (Addendum)
INJURY LEFT HAND CONTUSION, RULE OUT FRACTURE REQUIRED CT SCAN LED TO OVERUSE RIGHT HAND REQUIRING INJECTION RIGHT HAND   Encounter Diagnoses  Name Primary?   Pain in left hand    Contusion of left hand, subsequent encounter    Pain in right hand    De Quervain's tenosynovitis, right Yes

## 2022-03-01 NOTE — Progress Notes (Signed)
Chief Complaint  Patient presents with   Wrist Pain    Left/ review CT  / patient states pain also in right hand asking for injection   Jacqueline Haney had her hand caught between 2 sliding doors at a [employer].  She had a hand contusion we initially thought.  However she did get better and we had to rule out fracture by getting a CT scan.  CT scan was obtained there was no fracture  She still has some discomfort in the left hand  During the time that her left hand was injured she reports that she had to use her right hand more and now has pain over the first extensor compartment pain with ulnar deviation  Exam is consistent with positive Finkelstein's test tenderness in the first extensor compartment and positive de Quervain's syndrome  Recommend injection right first extensor compartment  Left hand contusion will resolve on its own   Procedure note injection RIGHT wrist for de Quervain's syndrome  The patient has consented for injection of THE RIGHT wrist for de Quervain's syndrome 40 mg of Depo-Medrol 1 mL, 3 mL 1% lidocaine. Patient gave verbal consent timeout to confirm site of injection  Sterile technique ethyl chloride used for skin prep  No complications

## 2022-05-22 ENCOUNTER — Emergency Department (HOSPITAL_COMMUNITY)
Admission: EM | Admit: 2022-05-22 | Discharge: 2022-05-22 | Disposition: A | Discharge status: Home / Self Care | Payer: Medicare PPO | Attending: Emergency Medicine | Admitting: Emergency Medicine

## 2022-05-22 ENCOUNTER — Other Ambulatory Visit: Payer: Self-pay

## 2022-05-22 ENCOUNTER — Encounter (HOSPITAL_COMMUNITY): Payer: Self-pay

## 2022-05-22 ENCOUNTER — Emergency Department (HOSPITAL_COMMUNITY): Payer: Medicare PPO

## 2022-05-22 DIAGNOSIS — R519 Headache, unspecified: Secondary | ICD-10-CM | POA: Insufficient documentation

## 2022-05-22 LAB — CBC WITH DIFFERENTIAL/PLATELET
Abs Immature Granulocytes: 0.02 10*3/uL (ref 0.00–0.07)
Basophils Absolute: 0.1 10*3/uL (ref 0.0–0.1)
Basophils Relative: 1 %
Eosinophils Absolute: 0.1 10*3/uL (ref 0.0–0.5)
Eosinophils Relative: 1 %
HCT: 41.9 % (ref 36.0–46.0)
Hemoglobin: 14.2 g/dL (ref 12.0–15.0)
Immature Granulocytes: 0 %
Lymphocytes Relative: 42 %
Lymphs Abs: 4.6 10*3/uL — ABNORMAL HIGH (ref 0.7–4.0)
MCH: 29.4 pg (ref 26.0–34.0)
MCHC: 33.9 g/dL (ref 30.0–36.0)
MCV: 86.7 fL (ref 80.0–100.0)
Monocytes Absolute: 0.7 10*3/uL (ref 0.1–1.0)
Monocytes Relative: 6 %
Neutro Abs: 5.6 10*3/uL (ref 1.7–7.7)
Neutrophils Relative %: 50 %
Platelets: 357 10*3/uL (ref 150–400)
RBC: 4.83 MIL/uL (ref 3.87–5.11)
RDW: 14.3 % (ref 11.5–15.5)
WBC: 11.1 10*3/uL — ABNORMAL HIGH (ref 4.0–10.5)
nRBC: 0 % (ref 0.0–0.2)

## 2022-05-22 LAB — COMPREHENSIVE METABOLIC PANEL
ALT: 18 U/L (ref 0–44)
AST: 20 U/L (ref 15–41)
Albumin: 3.8 g/dL (ref 3.5–5.0)
Alkaline Phosphatase: 84 U/L (ref 38–126)
Anion gap: 9 (ref 5–15)
BUN: 17 mg/dL (ref 8–23)
CO2: 25 mmol/L (ref 22–32)
Calcium: 9.3 mg/dL (ref 8.9–10.3)
Chloride: 108 mmol/L (ref 98–111)
Creatinine, Ser: 0.61 mg/dL (ref 0.44–1.00)
GFR, Estimated: >60 mL/min (ref >60)
Glucose, Bld: 108 mg/dL — ABNORMAL HIGH (ref 70–99)
Potassium: 3.5 mmol/L (ref 3.5–5.1)
Sodium: 142 mmol/L (ref 135–145)
Total Bilirubin: 0.4 mg/dL (ref 0.3–1.2)
Total Protein: 7.1 g/dL (ref 6.5–8.1)

## 2022-05-22 MED ORDER — RIZATRIPTAN BENZOATE 10 MG PO TABS: ORAL_TABLET | ORAL | 0 refills | Status: AC

## 2022-05-22 MED ORDER — DIPHENHYDRAMINE HCL 50 MG/ML IJ SOLN
12.5000 mg | Freq: Once | INTRAMUSCULAR | Status: AC
  Administered 2022-05-22: 12.5 mg via INTRAVENOUS
  Filled 2022-05-22: qty 1

## 2022-05-22 MED ORDER — KETOROLAC TROMETHAMINE 15 MG/ML IJ SOLN
15.0000 mg | Freq: Once | INTRAMUSCULAR | Status: DC
  Filled 2022-05-22: qty 1

## 2022-05-22 MED ORDER — METOCLOPRAMIDE HCL 5 MG/ML IJ SOLN
10.0000 mg | Freq: Once | INTRAMUSCULAR | Status: AC
  Administered 2022-05-22: 10 mg via INTRAVENOUS
  Filled 2022-05-22: qty 2

## 2022-05-22 MED ORDER — SODIUM CHLORIDE 0.9 % IV BOLUS
1000.0000 mL | Freq: Once | INTRAVENOUS | Status: AC
  Administered 2022-05-22: 1000 mL via INTRAVENOUS

## 2022-05-22 MED ORDER — SUMATRIPTAN SUCCINATE 50 MG PO TABS: ORAL_TABLET | ORAL | 0 refills | Status: AC

## 2022-05-22 NOTE — ED Provider Notes (Signed)
[facility] EMERGENCY DEPARTMENT Provider Note   CSN: [id number] Arrival date & time: 05/22/22  1133     History  Chief Complaint  Patient presents with   Headache    [name] is a 66 y.o. female.  Patient is a 66 year old female with past medical history of migraines presenting for complaints of headache.  Patient admits to acute onset left-sided, parietal region, severe, sharp, headache started this morning.  Patient states this is unlike her normal migraines that are normally generalized in location, dull, and throbbing.  Denies any sensation or motor deficits.  Denies any falls or recent head trauma.  Denies any blood thinner use. Denies a prior history of subarachnoid hemorrhages or known cerebral aneurysms.  She is out of her home prescriptions for sumatriptan.  States she took Motrin prior to arrival with no improvement of symptoms.  Otherwise denies fevers, chills, myalgias, URI symptoms, or neck stiffness.  The history is provided by the patient. No language interpreter was used.  Headache Associated symptoms: no abdominal pain, no back pain, no cough, no ear pain, no eye pain, no fever, no seizures, no sore throat and no vomiting        Home Medications Prior to Admission medications   Medication Sig Start Date End Date Taking? Authorizing Provider  gabapentin (NEURONTIN) 100 MG capsule Take 1 capsule (100 mg total) by mouth 3 (three) times daily. 11/24/20   Jacqueline Civil, MD  pantoprazole (PROTONIX) 20 MG tablet Take 1 tablet (20 mg total) by mouth daily. 11/12/20 12/12/20  Henderly, Britni A, PA-C  rizatriptan (MAXALT) 10 MG tablet SMARTSIG:1 Tablet(s) By Mouth 26/7/12   Campbell Stall P, DO  sucralfate (CARAFATE) 1 g tablet Take 1 tablet (1 g total) by mouth 4 (four) times daily -  with meals and at bedtime for 10 days. 11/12/20 11/22/20  Henderly, Britni A, PA-C  SUMAtriptan (IMITREX) 50 MG tablet SMARTSIG:1 Tablet(s) By Mouth 1-2 Times Daily 45/8/09   Campbell Stall P, DO      Allergies    Aspirin, Codeine, Hydrocodone, Oxycodone-acetaminophen, and Tape    Review of Systems   Review of Systems  Constitutional:  Negative for chills and fever.  HENT:  Negative for ear pain and sore throat.   Eyes:  Negative for pain and visual disturbance.  Respiratory:  Negative for cough and shortness of breath.   Cardiovascular:  Negative for chest pain and palpitations.  Gastrointestinal:  Negative for abdominal pain and vomiting.  Genitourinary:  Negative for dysuria and hematuria.  Musculoskeletal:  Negative for arthralgias and back pain.  Skin:  Negative for color change and rash.  Neurological:  Positive for headaches. Negative for seizures and syncope.  All other systems reviewed and are negative.   Physical Exam Updated Vital Signs BP 110/72 (BP Location: Left Arm)   Pulse 61   Temp 98.7 F (37.1 C) (Oral)   Resp 16   Ht '5\' 6"'$  (1.676 m)   Wt 79.8 kg   SpO2 96%   BMI 28.41 kg/m  Physical Exam Vitals and nursing note reviewed.  Constitutional:      General: She is not in acute distress.    Appearance: She is well-developed.  HENT:     Head: Normocephalic and atraumatic.  Eyes:     General: Lids are normal. Vision grossly intact.     Conjunctiva/sclera: Conjunctivae normal.     Pupils: Pupils are equal, round, and reactive to light.  Cardiovascular:  Rate and Rhythm: Normal rate and regular rhythm.     Heart sounds: No murmur heard. Pulmonary:     Effort: Pulmonary effort is normal. No respiratory distress.     Breath sounds: Normal breath sounds.  Abdominal:     Palpations: Abdomen is soft.     Tenderness: There is no abdominal tenderness.  Musculoskeletal:        General: No swelling.     Cervical back: Neck supple.  Skin:    General: Skin is warm and dry.     Capillary Refill: Capillary refill takes less than 2 seconds.  Neurological:     General: No focal deficit present.     Mental Status: She is alert and oriented  to person, place, and time.     GCS: GCS eye subscore is 4. GCS verbal subscore is 5. GCS motor subscore is 6.     Cranial Nerves: Cranial nerves 2-12 are intact.     Sensory: Sensation is intact.     Motor: Motor function is intact.     Coordination: Coordination is intact.     Gait: Gait is intact.  Psychiatric:        Mood and Affect: Mood normal.     ED Results / Procedures / Treatments   Labs (all labs ordered are listed, but only abnormal results are displayed) Labs Reviewed  CBC WITH DIFFERENTIAL/PLATELET - Abnormal; Notable for the following components:      Result Value   WBC 11.1 (*)    Lymphs Abs 4.6 (*)    All other components within normal limits  COMPREHENSIVE METABOLIC PANEL - Abnormal; Notable for the following components:   Glucose, Bld 108 (*)    All other components within normal limits    EKG None  Radiology CT Head Wo Contrast  Result Date: 05/22/2022 CLINICAL DATA:  Headache. EXAM: CT HEAD WITHOUT CONTRAST TECHNIQUE: Contiguous axial images were obtained from the base of the skull through the vertex without intravenous contrast. RADIATION DOSE REDUCTION: This exam was performed according to the departmental dose-optimization program which includes automated exposure control, adjustment of the mA and/or kV according to patient size and/or use of iterative reconstruction technique. COMPARISON:  09/13/2004. FINDINGS: Brain: No evidence of acute infarction, hemorrhage, hydrocephalus, extra-axial collection or mass lesion/mass effect. Vascular: No hyperdense vessel or unexpected calcification. Skull: Normal. Negative for fracture or focal lesion. Sinuses/Orbits: Globes and orbits are unremarkable. Visualized sinuses are clear. Other: None. IMPRESSION: Normal enhanced CT scan of the brain. Electronically Signed   By: Lajean Manes M.D.   On: 05/22/2022 13:02    Procedures Procedures    Medications Ordered in ED Medications  ketorolac (TORADOL) 15 MG/ML  injection 15 mg (15 mg Intravenous Patient Refused/Not Given 05/22/22 1331)  sodium chloride 0.9 % bolus 1,000 mL (1,000 mLs Intravenous New Bag/Given 05/22/22 1241)  metoCLOPramide (REGLAN) injection 10 mg (10 mg Intravenous Given 05/22/22 1244)  diphenhydrAMINE (BENADRYL) injection 12.5 mg (12.5 mg Intravenous Given 05/22/22 1243)    ED Course/ Medical Decision Making/ A&P                           Medical Decision Making Amount and/or Complexity of Data Reviewed Labs: ordered. Radiology: ordered.  Risk Prescription drug management.   62:39 PM 66 year old female with past medical history of migraines presenting for complaints of headache.  Patient is alert oriented x3, no acute distress, afebrile, stable vital signs.  Physical exam demonstrates no  neurovascular deficits.  CT head ordered secondary to abnormal nature of headache.  Demonstrates no acute process.  Patient given Reglan, Benadryl, and IV fluids with improvement of symptoms.  Recommended for Toradol however did declined at this time.  Patient states she is feeling 100% better and requesting discharge.  Patient's prescription for sumatriptan refilled by myself.  Patient in no distress and overall condition improved here in the ED. Detailed discussions were had with the patient regarding current findings, and need for close f/u with PCP or on call doctor. The patient has been instructed to return immediately if the symptoms worsen in any way for re-evaluation. Patient verbalized understanding and is in agreement with current care plan. All questions answered prior to discharge.         Final Clinical Impression(s) / ED Diagnoses Final diagnoses:  Nonintractable headache, unspecified chronicity pattern, unspecified headache type    Rx / DC Orders ED Discharge Orders          Ordered    rizatriptan (MAXALT) 10 MG tablet        05/22/22 1218    SUMAtriptan (IMITREX) 50 MG tablet        05/22/22 1218               Campbell Stall P, DO 54/00/86 1342

## 2022-05-22 NOTE — ED Notes (Signed)
Pt to CT

## 2022-05-22 NOTE — ED Triage Notes (Signed)
Woke up this morning with a sharp pain in head.

## 2022-07-15 ENCOUNTER — Emergency Department (HOSPITAL_COMMUNITY)
Admission: EM | Admit: 2022-07-15 | Discharge: 2022-07-15 | Disposition: A | Discharge status: Home / Self Care | Payer: Medicare PPO | Attending: Emergency Medicine | Admitting: Emergency Medicine

## 2022-07-15 ENCOUNTER — Emergency Department (HOSPITAL_COMMUNITY): Payer: Medicare PPO

## 2022-07-15 ENCOUNTER — Encounter (HOSPITAL_COMMUNITY): Payer: Self-pay | Admitting: Emergency Medicine

## 2022-07-15 ENCOUNTER — Other Ambulatory Visit: Payer: Self-pay

## 2022-07-15 DIAGNOSIS — R0789 Other chest pain: Secondary | ICD-10-CM | POA: Diagnosis not present

## 2022-07-15 LAB — BASIC METABOLIC PANEL
Anion gap: 10 (ref 5–15)
BUN: 12 mg/dL (ref 8–23)
CO2: 24 mmol/L (ref 22–32)
Calcium: 9.6 mg/dL (ref 8.9–10.3)
Chloride: 105 mmol/L (ref 98–111)
Creatinine, Ser: 0.72 mg/dL (ref 0.44–1.00)
GFR, Estimated: >60 mL/min (ref >60)
Glucose, Bld: 100 mg/dL — ABNORMAL HIGH (ref 70–99)
Potassium: 4.1 mmol/L (ref 3.5–5.1)
Sodium: 139 mmol/L (ref 135–145)

## 2022-07-15 LAB — TROPONIN I (HIGH SENSITIVITY)
Troponin I (High Sensitivity): <2 ng/L (ref <18)
Troponin I (High Sensitivity): <2 ng/L (ref <18)

## 2022-07-15 LAB — CBC
HCT: 45.8 % (ref 36.0–46.0)
Hemoglobin: 15.4 g/dL — ABNORMAL HIGH (ref 12.0–15.0)
MCH: 29.1 pg (ref 26.0–34.0)
MCHC: 33.6 g/dL (ref 30.0–36.0)
MCV: 86.4 fL (ref 80.0–100.0)
Platelets: 306 10*3/uL (ref 150–400)
RBC: 5.3 MIL/uL — ABNORMAL HIGH (ref 3.87–5.11)
RDW: 13.6 % (ref 11.5–15.5)
WBC: 11.8 10*3/uL — ABNORMAL HIGH (ref 4.0–10.5)
nRBC: 0 % (ref 0.0–0.2)

## 2022-07-15 LAB — HEPATIC FUNCTION PANEL
ALT: 16 U/L (ref 0–44)
AST: 20 U/L (ref 15–41)
Albumin: 3.6 g/dL (ref 3.5–5.0)
Alkaline Phosphatase: 90 U/L (ref 38–126)
Bilirubin, Direct: 0.1 mg/dL (ref 0.0–0.2)
Indirect Bilirubin: 0.2 mg/dL — ABNORMAL LOW (ref 0.3–0.9)
Total Bilirubin: 0.3 mg/dL (ref 0.3–1.2)
Total Protein: 7 g/dL (ref 6.5–8.1)

## 2022-07-15 MED ORDER — CYCLOBENZAPRINE HCL 10 MG PO TABS: 10.0000 mg | ORAL_TABLET | Freq: Three times a day (TID) | ORAL | 0 refills | Status: DC | PRN

## 2022-07-15 MED ORDER — HYDROMORPHONE HCL 1 MG/ML IJ SOLN
0.5000 mg | Freq: Once | INTRAMUSCULAR | Status: AC
  Administered 2022-07-15: 0.5 mg via INTRAVENOUS
  Filled 2022-07-15: qty 0.5

## 2022-07-15 NOTE — ED Provider Notes (Signed)
[facility] ([place]) EMERGENCY DEPARTMENT Provider Note   CSN: [id number] Arrival date & time: 07/15/22  1206     History  Chief Complaint  Patient presents with   Chest Pain    NOGA Jacqueline Haney is a 66 y.o. female.  Patient complains of chest discomfort.  She has a history of peptic ulcer disease.  She had a normal stress test.  The history is provided by the patient and medical records. No language interpreter was used.  Chest Pain Pain location:  L chest Pain quality: aching   Pain radiates to:  Does not radiate Pain severity:  Mild Onset quality:  Sudden Timing:  Constant Progression:  Worsening Chronicity:  New Associated symptoms: no abdominal pain, no back pain, no cough, no fatigue and no headache        Home Medications Prior to Admission medications   Medication Sig Start Date End Date Taking? Authorizing Provider  cyclobenzaprine (FLEXERIL) 10 MG tablet Take 1 tablet (10 mg total) by mouth 3 (three) times daily as needed (Chest wall pain). 07/15/22  Yes Milton Ferguson, MD  rizatriptan (MAXALT) 10 MG tablet SMARTSIG:1 Tablet(s) By Mouth Patient taking differently: Take 10 mg by mouth as needed for migraine. SMARTSIG:1 Tablet(s) By Mouth 40/9/81  Yes Campbell Stall P, DO  gabapentin (NEURONTIN) 100 MG capsule Take 1 capsule (100 mg total) by mouth 3 (three) times daily. Patient not taking: Reported on 07/15/2022 11/24/20   Carole Civil, MD  pantoprazole (PROTONIX) 20 MG tablet Take 1 tablet (20 mg total) by mouth daily. 11/12/20 12/12/20  Henderly, Britni A, PA-C  sucralfate (CARAFATE) 1 g tablet Take 1 tablet (1 g total) by mouth 4 (four) times daily -  with meals and at bedtime for 10 days. 11/12/20 11/22/20  Henderly, Britni A, PA-C  SUMAtriptan (IMITREX) 50 MG tablet SMARTSIG:1 Tablet(s) By Mouth 1-2 Times Daily Patient not taking: Reported on 07/15/2022 19/1/47   Campbell Stall P, DO      Allergies    Aspirin, Codeine, Hydrocodone, Oxycodone-acetaminophen, and Tape     Review of Systems   Review of Systems  Constitutional:  Negative for appetite change and fatigue.  HENT:  Negative for congestion, ear discharge and sinus pressure.   Eyes:  Negative for discharge.  Respiratory:  Negative for cough.   Cardiovascular:  Positive for chest pain.  Gastrointestinal:  Negative for abdominal pain and diarrhea.  Genitourinary:  Negative for frequency and hematuria.  Musculoskeletal:  Negative for back pain.  Skin:  Negative for rash.  Neurological:  Negative for seizures and headaches.  Psychiatric/Behavioral:  Negative for hallucinations.     Physical Exam Updated Vital Signs BP 121/73   Pulse 82   Temp 97.7 F (36.5 C) (Oral)   Resp (!) 22   Ht '5\' 6"'$  (1.676 m)   Wt 78.5 kg   SpO2 92%   BMI 27.92 kg/m  Physical Exam Vitals and nursing note reviewed.  Constitutional:      Appearance: She is well-developed.  HENT:     Head: Normocephalic.     Nose: Nose normal.  Eyes:     General: No scleral icterus.    Conjunctiva/sclera: Conjunctivae normal.  Neck:     Thyroid: No thyromegaly.  Cardiovascular:     Rate and Rhythm: Normal rate and regular rhythm.     Heart sounds: No murmur heard.    No friction rub. No gallop.  Pulmonary:     Breath sounds: No stridor. No wheezing or rales.  Chest:     Chest wall: Tenderness present.  Abdominal:     General: There is no distension.     Tenderness: There is no abdominal tenderness. There is no rebound.  Musculoskeletal:        General: Normal range of motion.     Cervical back: Neck supple.  Lymphadenopathy:     Cervical: No cervical adenopathy.  Skin:    Findings: No erythema or rash.  Neurological:     Mental Status: She is alert and oriented to person, place, and time.     Motor: No abnormal muscle tone.     Coordination: Coordination normal.  Psychiatric:        Behavior: Behavior normal.     ED Results / Procedures / Treatments   Labs (all labs ordered are listed, but only  abnormal results are displayed) Labs Reviewed  BASIC METABOLIC PANEL - Abnormal; Notable for the following components:      Result Value   Glucose, Bld 100 (*)    All other components within normal limits  CBC - Abnormal; Notable for the following components:   WBC 11.8 (*)    RBC 5.30 (*)    Hemoglobin 15.4 (*)    All other components within normal limits  HEPATIC FUNCTION PANEL  TROPONIN I (HIGH SENSITIVITY)  TROPONIN I (HIGH SENSITIVITY)    EKG EKG Interpretation  Date/Time:  Thursday July 15 2022 12:23:37 EST Ventricular Rate:  85 PR Interval:  138 QRS Duration: 72 QT Interval:  356 QTC Calculation: 423 R Axis:   -65 Text Interpretation: Normal sinus rhythm Left axis deviation Anterior infarct , age undetermined Abnormal ECG When compared with ECG of 12-Nov-2020 09:40, PREVIOUS ECG IS PRESENT Confirmed by Milton Ferguson [phone]) on 07/15/2022 1:17:10 PM  Radiology DG Chest Port 1 View  Result Date: 07/15/2022 CLINICAL DATA:  Chest pain EXAM: PORTABLE CHEST 1 VIEW COMPARISON:  Radiograph 11/12/2020 FINDINGS: The cardiomediastinal silhouette is unchanged. There is no focal airspace consolidation. There is no pleural effusion or evidence of pneumothorax. There is no acute osseous abnormality. Bowel shoulder degenerative changes. Thoracic spondylosis. IMPRESSION: No evidence of acute cardiopulmonary disease. Electronically Signed   By: Maurine Simmering M.D.   On: 07/15/2022 12:37    Procedures Procedures    Medications Ordered in ED Medications  HYDROmorphone (DILAUDID) injection 0.5 mg (0.5 mg Intravenous Given 07/15/22 1355)    ED Course/ Medical Decision Making/ A&P                           Medical Decision Making Amount and/or Complexity of Data Reviewed Labs: ordered. Radiology: ordered.  Risk Prescription drug management.  This patient presents to the ED for concern of chest pain, this involves an extensive number of treatment options, and is a complaint  that carries with it a high risk of complications and morbidity.  The differential diagnosis includes atypical chest pain, coronary artery disease   Co morbidities that complicate the patient evaluation  Gallstone disease   Additional history obtained:  Additional history obtained from patient External records from outside source obtained and reviewed including hospital records   Lab Tests:  I Ordered, and personally interpreted labs.  The pertinent results include: White count 11.8, troponin x 2 negative   Imaging Studies ordered:  I ordered imaging studies including chest x-ray I independently visualized and interpreted imaging which showed negative I agree with the radiologist interpretation   Cardiac Monitoring: / EKG:  The patient was maintained on a cardiac monitor.  I personally viewed and interpreted the cardiac monitored which showed an underlying rhythm of: Normal sinus rhythm   Consultations Obtained:  No consultant  Problem List / ED Course / Critical interventions / Medication management  Septic ulcer disease and chest pain I ordered medication including Dilaudid for pain Reevaluation of the patient after these medicines showed that the patient improved I have reviewed the patients home medicines and have made adjustments as needed   Social Determinants of Health:  None   Test / Admission - Considered:  No additional test needed  Patient with chest wall pain.  She is given Flexeril and told to take Tylenol and follow-up with her family doctor and cardiology        Final Clinical Impression(s) / ED Diagnoses Final diagnoses:  Atypical chest pain    Rx / DC Orders ED Discharge Orders          Ordered    cyclobenzaprine (FLEXERIL) 10 MG tablet  3 times daily PRN        07/15/22 1520              Milton Ferguson, MD 07/17/22 1012

## 2022-07-15 NOTE — Discharge Instructions (Signed)
Follow-up with Dr. Harl Bowie in 2 to 3 weeks for recheck.  Also follow-up with your family doctor and take Tylenol for pain.  You are also given a muscle relaxer if needed for the discomfort

## 2022-07-15 NOTE — ED Triage Notes (Signed)
Pt via POV c/o central and left-sided chest pain with radiation to back x 20 minutes. Pt denies additional symptoms including nausea, dizziness, sweating, headache, SOB. No previous cardiac history.

## 2022-10-07 ENCOUNTER — Other Ambulatory Visit (HOSPITAL_COMMUNITY): Payer: Self-pay | Admitting: Family Medicine

## 2022-10-07 DIAGNOSIS — Z1231 Encounter for screening mammogram for malignant neoplasm of breast: Secondary | ICD-10-CM

## 2022-10-14 ENCOUNTER — Encounter: Payer: Self-pay | Admitting: Radiology

## 2022-11-01 ENCOUNTER — Ambulatory Visit (HOSPITAL_COMMUNITY): Payer: Medicare PPO

## 2022-11-16 ENCOUNTER — Other Ambulatory Visit: Payer: Self-pay

## 2022-11-16 ENCOUNTER — Encounter (HOSPITAL_COMMUNITY): Payer: Self-pay

## 2022-11-16 ENCOUNTER — Emergency Department (HOSPITAL_COMMUNITY)
Admission: EM | Admit: 2022-11-16 | Discharge: 2022-11-17 | Disposition: A | Discharge status: Home / Self Care | Payer: Medicare PPO | Attending: Emergency Medicine | Admitting: Emergency Medicine

## 2022-11-16 DIAGNOSIS — M545 Low back pain, unspecified: Secondary | ICD-10-CM

## 2022-11-16 MED ORDER — CYCLOBENZAPRINE HCL 10 MG PO TABS: 10.0000 mg | ORAL_TABLET | Freq: Two times a day (BID) | ORAL | 0 refills | Status: AC | PRN

## 2022-11-16 MED ORDER — KETOROLAC TROMETHAMINE 30 MG/ML IJ SOLN
30.0000 mg | Freq: Once | INTRAMUSCULAR | Status: AC
  Administered 2022-11-17: 30 mg via INTRAMUSCULAR
  Filled 2022-11-16: qty 1

## 2022-11-16 MED ORDER — IBUPROFEN 400 MG PO TABS
400.0000 mg | ORAL_TABLET | Freq: Once | ORAL | Status: AC | PRN
  Administered 2022-11-16: 400 mg via ORAL
  Filled 2022-11-16: qty 1

## 2022-11-16 NOTE — Discharge Instructions (Addendum)
You were seen in the emergency department for low back pain. Based on your history and my examination, I believe that this is most likely a low back strain without sciatica present. Typically anti-inflammatory medication is the treatment of choice to alleviate these symptoms. I have also sent a prescription for Flexeril, a muscle relaxer, to your pharmacy which you can take for additional relief. Be aware that this can cause some fatigue and sedation so take at night or take when you do not plan to operate a vehicle.

## 2022-11-16 NOTE — ED Triage Notes (Signed)
Pt c/o R sided back pain without radiation that started this AM. Pt denies injury or urinary symptoms.

## 2022-11-16 NOTE — ED Notes (Signed)
Patient ambulated to the bathroom.

## 2022-11-16 NOTE — ED Provider Notes (Signed)
Jacqueline Haney Provider Note   CSN: [id number] Arrival date & time: 11/16/22  1958     History Chief Complaint  Patient presents with   Back Pain    Jacqueline Haney is a 67 y.o. female.  Patient presents emergency department complaints of back pain.  She reports that his back pain began about a day ago or so and has been present without significant radiation into any extremity or down the legs.  She denies any urinary symptoms at this time such as dysuria, hematuria, increased urinary frequency or urgency.  No prior history of any kidney stones.  Does not believe that she has injured this area as she has not had any recent falls or any other injuries to the area.  Has not tried taking any medications to address this pain.  She received a 400 mg dose of ibuprofen while in triage and reported symptomatic improvement with this. No recent fevers, weight loss, or night sweats.   Back Pain      Home Medications Prior to Admission medications   Medication Sig Start Date End Date Taking? Authorizing Provider  cyclobenzaprine (FLEXERIL) 10 MG tablet Take 1 tablet (10 mg total) by mouth 2 (two) times daily as needed for muscle spasms. 11/16/22  Yes Lourdes Sledge A, PA-C  gabapentin (NEURONTIN) 100 MG capsule Take 1 capsule (100 mg total) by mouth 3 (three) times daily. Patient not taking: Reported on 07/15/2022 11/24/20   Carole Civil, MD  pantoprazole (PROTONIX) 20 MG tablet Take 1 tablet (20 mg total) by mouth daily. 11/12/20 12/12/20  Henderly, Britni A, PA-C  rizatriptan (MAXALT) 10 MG tablet SMARTSIG:1 Tablet(s) By Mouth Patient taking differently: Take 10 mg by mouth as needed for migraine. SMARTSIG:1 Tablet(s) By Mouth [id number]   Campbell Stall P, DO  sucralfate (CARAFATE) 1 g tablet Take 1 tablet (1 g total) by mouth 4 (four) times daily -  with meals and at bedtime for 10 days. 11/12/20 11/22/20  Henderly, Britni A, PA-C  SUMAtriptan (IMITREX) 50  MG tablet SMARTSIG:1 Tablet(s) By Mouth 1-2 Times Daily Patient not taking: Reported on 07/15/2022 [id number]   Campbell Stall P, DO      Allergies    Aspirin, Codeine, Hydrocodone, Oxycodone-acetaminophen, and Tape    Review of Systems   Review of Systems  Musculoskeletal:  Positive for back pain.  All other systems reviewed and are negative.   Physical Exam Updated Vital Signs BP 135/77 (BP Location: Right Arm)   Pulse 71   Temp 98.5 F (36.9 C) (Oral)   Resp 16   Ht 5\' 5"  (1.651 m)   Wt 72.6 kg   SpO2 100%   BMI 26.63 kg/m  Physical Exam Vitals and nursing note reviewed.  Constitutional:      General: She is not in acute distress.    Appearance: She is well-developed.  HENT:     Head: Normocephalic and atraumatic.  Eyes:     Conjunctiva/sclera: Conjunctivae normal.  Cardiovascular:     Rate and Rhythm: Normal rate and regular rhythm.     Heart sounds: No murmur heard. Pulmonary:     Effort: Pulmonary effort is normal. No respiratory distress.     Breath sounds: Normal breath sounds.  Abdominal:     Palpations: Abdomen is soft.     Tenderness: There is no abdominal tenderness. There is no right CVA tenderness or left CVA tenderness.  Musculoskeletal:        General:  Tenderness present. No swelling.     Cervical back: Neck supple.     Comments: Low back pain to palpation along paraspinal area  Skin:    General: Skin is warm and dry.     Capillary Refill: Capillary refill takes less than 2 seconds.  Neurological:     Mental Status: She is alert.  Psychiatric:        Mood and Affect: Mood normal.     ED Results / Procedures / Treatments   Labs (all labs ordered are listed, but only abnormal results are displayed) Labs Reviewed - No data to display  EKG None  Radiology No results found.  Procedures Procedures   Medications Ordered in ED Medications  ketorolac (TORADOL) 30 MG/ML injection 30 mg (has no administration in time range)  ibuprofen (ADVIL)  tablet 400 mg (400 mg Oral Given 11/16/22 2050)    ED Course/ Medical Decision Making/ A&P                           Medical Decision Making Risk Prescription drug management.   This patient presents to the ED for concern of back pain.  Differential diagnosis includes nephrolithiasis, pyelonephritis, lumbar strain   Medicines ordered and prescription drug management:  I ordered medication including Toradol for back pain Reevaluation of the patient after these medicines showed that the patient improved I have reviewed the patients home medicines and have made adjustments as needed   Problem List / ED Course:  Patient presented to the emergency department complaints of back pain.  Given presentation with lack of any urinary findings such as dysuria, hematuria or lack of radiation of back pain in any direction, I believe this is most consistent with an acute low back pain. Advised patient that if she begins to experience any urinary symptoms, particularly hematuria, that she should return for further evaluation for a potential kidney stone. At this time, advised management with antiinflammatories and muscle relaxer at home. Patient agreeable with this treatment plan and verbalized understanding all return precautions. All questions answered prior to patient discharge.  Final Clinical Impression(s) / ED Diagnoses Final diagnoses:  Acute right-sided low back pain without sciatica    Rx / DC Orders ED Discharge Orders          Ordered    cyclobenzaprine (FLEXERIL) 10 MG tablet  2 times daily PRN        11/16/22 2349              Luvenia Heller, PA-C 11/16/22 2356    Cristie Hem, MD 11/17/22 1250

## 2023-10-24 ENCOUNTER — Other Ambulatory Visit (HOSPITAL_COMMUNITY): Payer: Self-pay | Admitting: Adult Health

## 2023-10-24 DIAGNOSIS — Z1231 Encounter for screening mammogram for malignant neoplasm of breast: Secondary | ICD-10-CM

## 2023-10-31 ENCOUNTER — Encounter (HOSPITAL_COMMUNITY): Payer: Self-pay

## 2023-10-31 ENCOUNTER — Ambulatory Visit (HOSPITAL_COMMUNITY)
Admission: RE | Admit: 2023-10-31 | Discharge: 2023-10-31 | Disposition: A | Discharge status: Home / Self Care | Source: Ambulatory Visit | Attending: Adult Health | Admitting: Adult Health

## 2023-10-31 DIAGNOSIS — Z1231 Encounter for screening mammogram for malignant neoplasm of breast: Secondary | ICD-10-CM | POA: Insufficient documentation

## 2023-11-08 ENCOUNTER — Emergency Department (HOSPITAL_COMMUNITY)
Admission: EM | Admit: 2023-11-08 | Discharge: 2023-11-08 | Disposition: A | Discharge status: Home / Self Care | Attending: Emergency Medicine | Admitting: Emergency Medicine

## 2023-11-08 ENCOUNTER — Encounter (HOSPITAL_COMMUNITY): Payer: Self-pay | Admitting: Emergency Medicine

## 2023-11-08 ENCOUNTER — Emergency Department (HOSPITAL_COMMUNITY)

## 2023-11-08 ENCOUNTER — Other Ambulatory Visit: Payer: Self-pay

## 2023-11-08 DIAGNOSIS — M5442 Lumbago with sciatica, left side: Secondary | ICD-10-CM | POA: Insufficient documentation

## 2023-11-08 DIAGNOSIS — M5432 Sciatica, left side: Secondary | ICD-10-CM

## 2023-11-08 DIAGNOSIS — M545 Low back pain, unspecified: Secondary | ICD-10-CM | POA: Diagnosis present

## 2023-11-08 MED ORDER — LIDOCAINE 5 % EX PTCH
1.0000 | MEDICATED_PATCH | CUTANEOUS | Status: DC
  Administered 2023-11-08: 1 via TRANSDERMAL
  Filled 2023-11-08: qty 1

## 2023-11-08 MED ORDER — PREDNISONE 50 MG PO TABS
60.0000 mg | ORAL_TABLET | Freq: Once | ORAL | Status: AC
  Administered 2023-11-08: 60 mg via ORAL
  Filled 2023-11-08: qty 1

## 2023-11-08 MED ORDER — LIDOCAINE 5 % EX PTCH: 1.0000 | MEDICATED_PATCH | CUTANEOUS | 0 refills | Status: AC

## 2023-11-08 MED ORDER — PREDNISONE 10 MG PO TABS: ORAL_TABLET | ORAL | 0 refills | Status: DC

## 2023-11-08 NOTE — ED Triage Notes (Signed)
 Pt c/o of sciatica pain in her left hip that runs down her leg. Started yesterday. Denies any injury.

## 2023-11-08 NOTE — Discharge Instructions (Signed)
 Take your next dose of prednisone tomorrow morning.   Avoid lifting,  Bending,  Twisting or any other activity that worsens your pain over the next week. You should get rechecked if your symptoms are not better over the next 5 days,  Or you develop increased pain,  Weakness in your leg(s) or loss of bladder or bowel function - these are symptoms of a potentially worsening low back problem.

## 2023-11-08 NOTE — ED Notes (Signed)
 Pt d/c home per EDP order. Discharge summary reviewed, verbalize understanding. Ambulatory off unit. NAD

## 2023-11-08 NOTE — ED Provider Notes (Signed)
  EMERGENCY DEPARTMENT AT [facility] Provider Note   CSN: [id number] Arrival date & time: 11/08/23  4098     History  Chief Complaint  Patient presents with   Leg Pain    Jacqueline Haney is a 68 y.o. female with a history including migraine headache, hyperlipidemia, no reported low back chronic problems although she was here a year ago where she was treated for right lumbar pain presenting with pain in her left paralumbar area since yesterday.  She describes a sharp pain, slightly burning in character as well which radiates down her left outer leg all the way to her foot.  She denies any injuries or falls.  Denies heavy lifting, was simply sitting in a chair and when she stood she noted symptoms which have progressed since yesterday.  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  She denies urinary or fecal incontinence or retention, denies hematuria.  She has had no treatment prior to arrival.  Symptoms are better at rest, worsens with movement.  No fevers, no history of kidney stones.  The history is provided by the patient.       Home Medications Prior to Admission medications   Medication Sig Start Date End Date Taking? Authorizing Provider  lidocaine (LIDODERM) 5 % Place 1 patch onto the skin daily. Remove & Discard patch within 12 hours or as directed by MD 11/08/23  Yes Jacarius Handel, Raynelle Fanning, PA-C  predniSONE (DELTASONE) 10 MG tablet 6, 5, 4, 3, 2 then 1 tablet by mouth daily for 6 days total. 11/08/23  Yes Everet Flagg, Raynelle Fanning, PA-C  cyclobenzaprine (FLEXERIL) 10 MG tablet Take 1 tablet (10 mg total) by mouth 2 (two) times daily as needed for muscle spasms. 11/16/22   Smitty Knudsen, PA-C  gabapentin (NEURONTIN) 100 MG capsule Take 1 capsule (100 mg total) by mouth 3 (three) times daily. Patient not taking: Reported on 07/15/2022 11/24/20   Vickki Hearing,  MD  pantoprazole (PROTONIX) 20 MG tablet Take 1 tablet (20 mg total) by mouth daily. 11/12/20 12/12/20  Henderly, Britni A, PA-C  rizatriptan (MAXALT) 10 MG tablet SMARTSIG:1 Tablet(s) By Mouth Patient taking differently: Take 10 mg by mouth as needed for migraine. SMARTSIG:1 Tablet(s) By Mouth 05/22/22   Edwin Dada P, DO  sucralfate (CARAFATE) 1 g tablet Take 1 tablet (1 g total) by mouth 4 (four) times daily -  with meals and at bedtime for 10 days. 11/12/20 11/22/20  Henderly, Britni A, PA-C  SUMAtriptan (IMITREX) 50 MG tablet SMARTSIG:1 Tablet(s) By Mouth 1-2 Times Daily Patient not taking: Reported on 07/15/2022 05/22/22   Edwin Dada P, DO      Allergies    Aspirin, Codeine, Hydrocodone, Oxycodone-acetaminophen, and Tape    Review of Systems   Review of Systems  Constitutional:  Negative for fever.  Respiratory:  Negative for shortness of breath.   Cardiovascular:  Negative for chest pain and leg swelling.  Gastrointestinal:  Negative for abdominal distention, abdominal pain and constipation.  Genitourinary:  Negative for difficulty urinating, dysuria, enuresis, flank pain, frequency and urgency.  Musculoskeletal:  Positive for back pain. Negative for gait problem and joint swelling.  Skin:  Negative for rash and wound.  Neurological:  Negative for weakness and numbness.    Physical Exam Updated Vital Signs BP 112/64   Pulse 65   Temp 98.5 F (36.9  C) (Oral)   Resp 16   Ht 5\' 5"  (1.651 m)   Wt 76.2 kg   SpO2 97%   BMI 27.96 kg/m  Physical Exam Vitals and nursing note reviewed.  Constitutional:      Appearance: She is well-developed.  HENT:     Head: Normocephalic.  Eyes:     Conjunctiva/sclera: Conjunctivae normal.  Cardiovascular:     Rate and Rhythm: Normal rate.     Pulses: Normal pulses.     Comments: Pedal pulses normal. Pulmonary:     Effort: Pulmonary effort is normal.  Abdominal:     General: Bowel sounds are normal. There is no distension.      Palpations: Abdomen is soft. There is no mass.  Musculoskeletal:        General: Normal range of motion.     Cervical back: Normal range of motion and neck supple.     Lumbar back: Tenderness present. No swelling, edema, spasms or bony tenderness. Negative left straight leg raise test.     Comments: Patient is holding her left lower paralumbar region.  She has no midline lumbar or thoracic tenderness.  She has a negative straight leg raise.  Skin:    General: Skin is warm and dry.  Neurological:     Mental Status: She is alert.     Sensory: No sensory deficit.     Motor: No tremor or atrophy.     Gait: Gait normal.     Deep Tendon Reflexes:     Reflex Scores:      Patellar reflexes are 2+ on the right side and 2+ on the left side.      Achilles reflexes are 2+ on the right side and 2+ on the left side.    Comments: No strength deficit noted in hip and knee flexor and extensor muscle groups.  Ankle flexion and extension intact.     ED Results / Procedures / Treatments   Labs (all labs ordered are listed, but only abnormal results are displayed) Labs Reviewed - No data to display  EKG None  Radiology No results found.  Procedures Procedures    Medications Ordered in ED Medications  lidocaine (LIDODERM) 5 % 1 patch (1 patch Transdermal Patch Applied 11/08/23 1126)  predniSONE (DELTASONE) tablet 60 mg (60 mg Oral Given 11/08/23 1126)    ED Course/ Medical Decision Making/ A&P                                 Medical Decision Making Patient presenting with left low back pain with radiation into her left foot suggesting a new onset sciatica.  She denies any specific injury but does have a history of prior low back pain for which she has been seen here in the past.  She has no neurodeficits based on exam or by history.  Given her age plain film imaging was obtained, currently pending reading of this film.  I independently reviewed the film with no obvious compression fracture,  bone met or other obvious emergent source of symptoms.  She is placed on a prednisone taper and Lidoderm patch was provided here along with her first dose of prednisone, her symptoms were actually improved by time of discharge.  Amount and/or Complexity of Data Reviewed Radiology: ordered and independent interpretation performed.    Details: Pending official reading, independently reviewed imaging, no compression fracture, no bony mets.  Risk Prescription drug management.  Final Clinical Impression(s) / ED Diagnoses Final diagnoses:  Sciatica of left side    Rx / DC Orders ED Discharge Orders          Ordered    predniSONE (DELTASONE) 10 MG tablet        11/08/23 1313    lidocaine (LIDODERM) 5 %  Every 24 hours        11/08/23 1314              Burgess Amor, PA-C 11/08/23 1319    Derwood Kaplan, MD 11/09/23 [phone]

## 2023-11-30 IMAGING — CT CT HAND*L* W/O CM
3 of 6 series · 12 of 33 positions shown, 14 images · non-contrast
Comparison: Office radiographs 12/14/2021 and 01/04/2022.

CLINICAL DATA: Dorsal hand pain following injury 3 weeks ago.

EXAM:
CT OF THE LEFT HAND WITHOUT CONTRAST
TECHNIQUE: Multidetector CT imaging of the left hand was performed according to
the standard protocol. Multiplanar CT image reconstructions were
also generated.
RADIATION DOSE REDUCTION: This exam was performed according to the
departmental dose-optimization program which includes automated
exposure control, adjustment of the mA and/or kV according to
patient size and/or use of iterative reconstruction technique.

[Series 3: axial bone · axial · 0.30mm/px · z∈[+236,+383]mm · 6 of 343 slices shown, 8 images]
[im 49/343  soft-tissue]
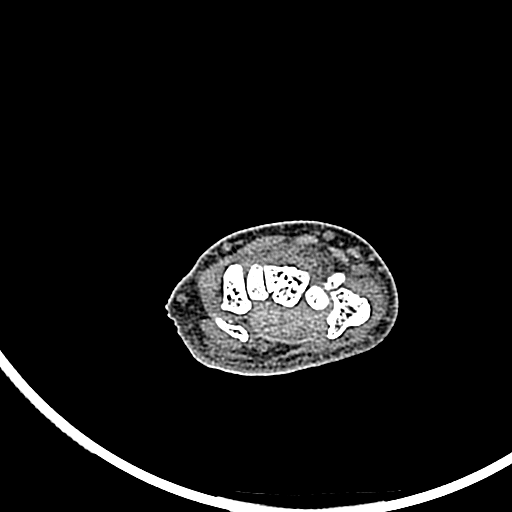
[im 49/343  bone]
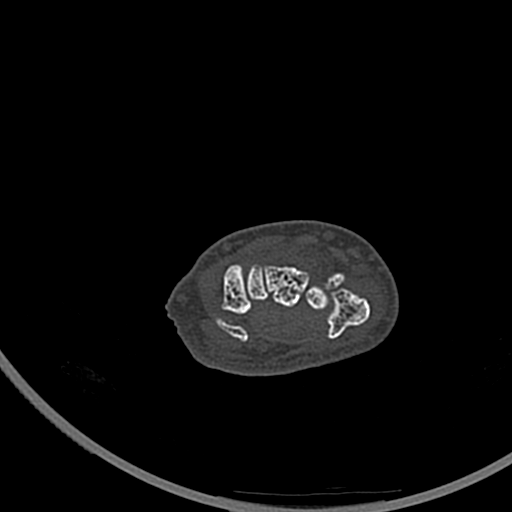
[im 98/343  bone]
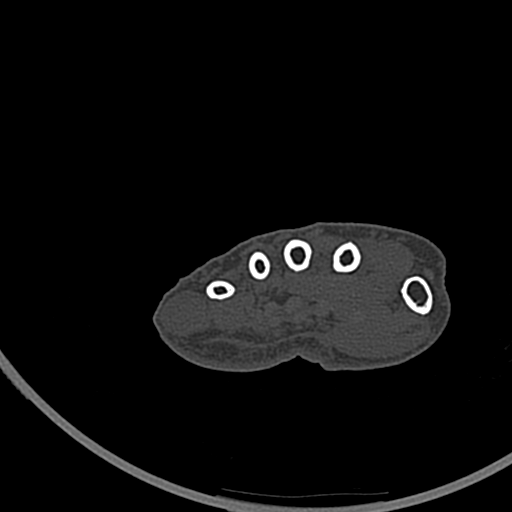
[im 147/343  bone]
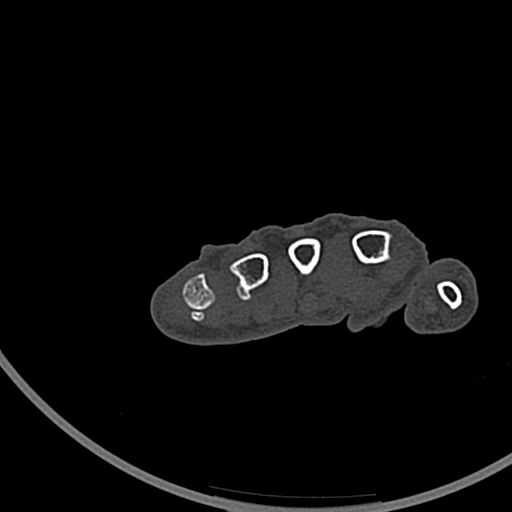
[im 196/343  bone]
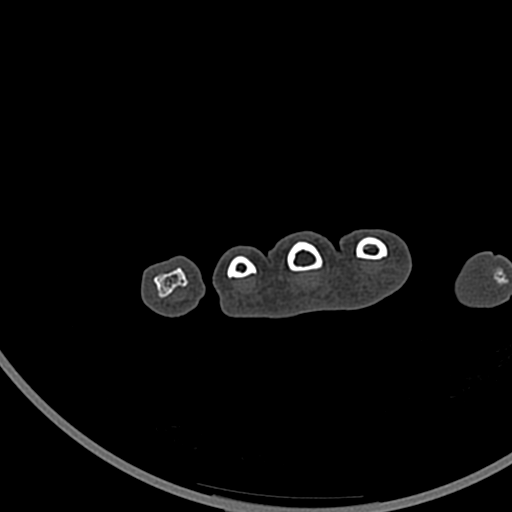
[im 245/343  soft-tissue]
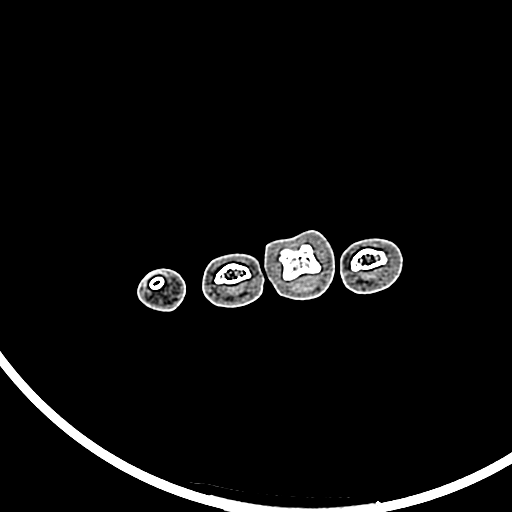
[im 245/343  bone]
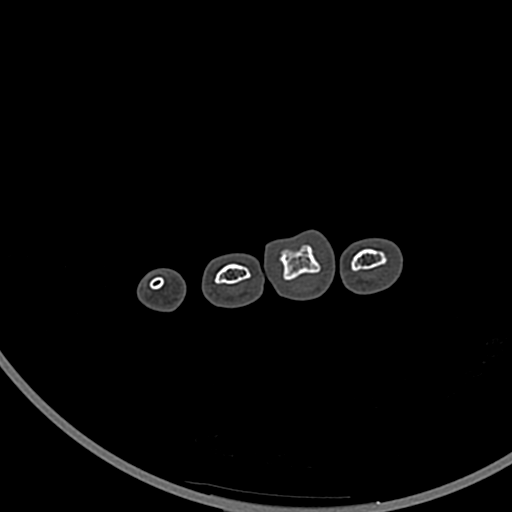
[im 294/343  bone]
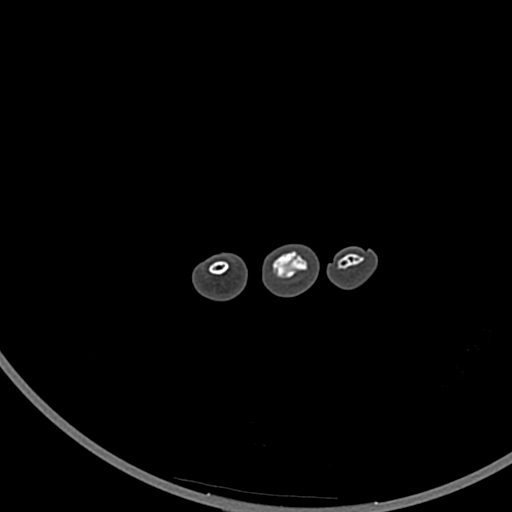

[Series 5: cor bone · coronal · 0.41mm/px · 1 of 58 slices shown]
[im 29/58  bone]
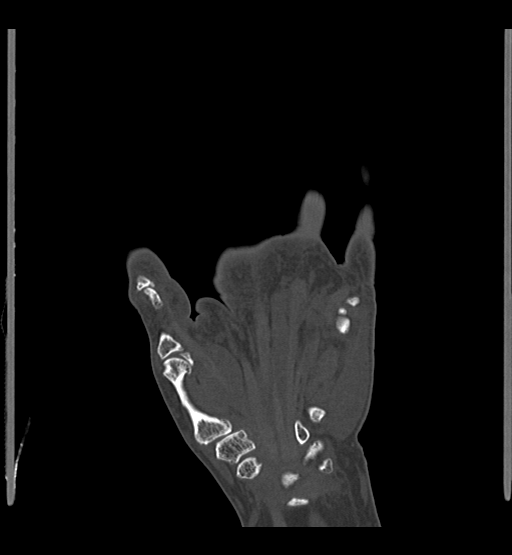

[Series 9: sag st · sagittal · 0.22mm/px · 5 of 209 slices shown]
[im 53/209  bone]
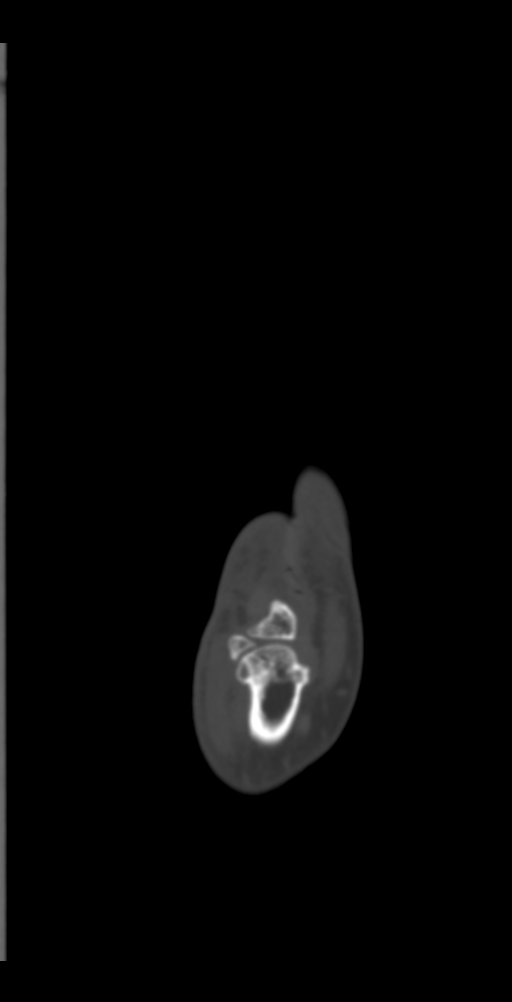
[im 79/209  bone]
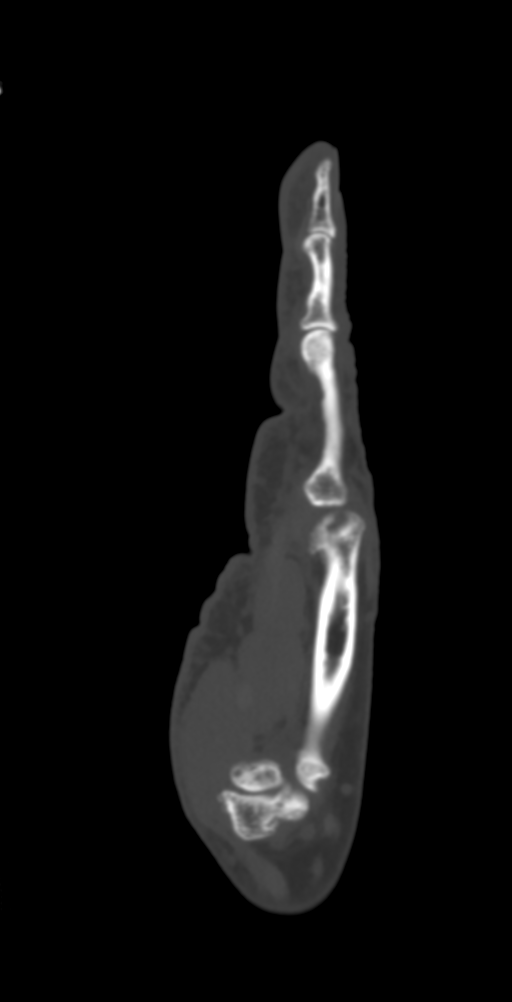
[im 105/209  bone]
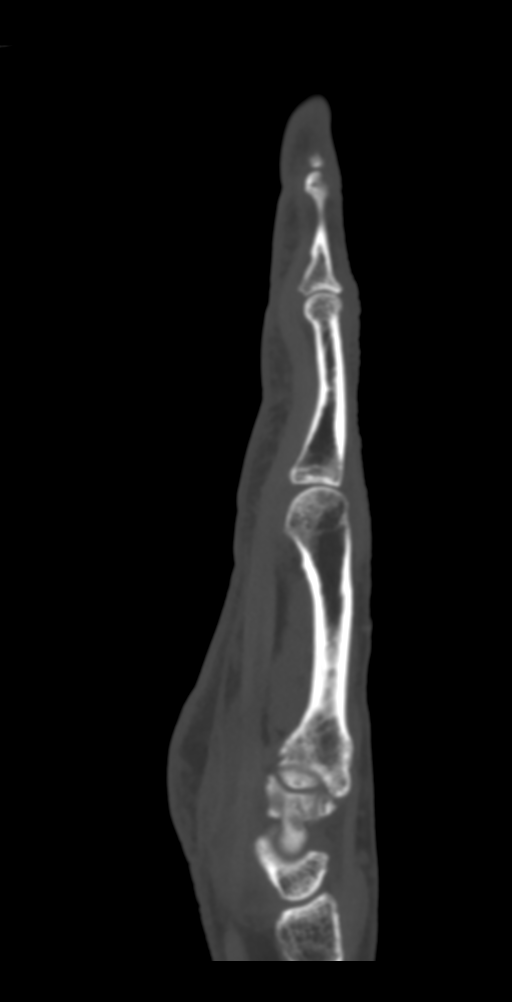
[im 131/209  bone]
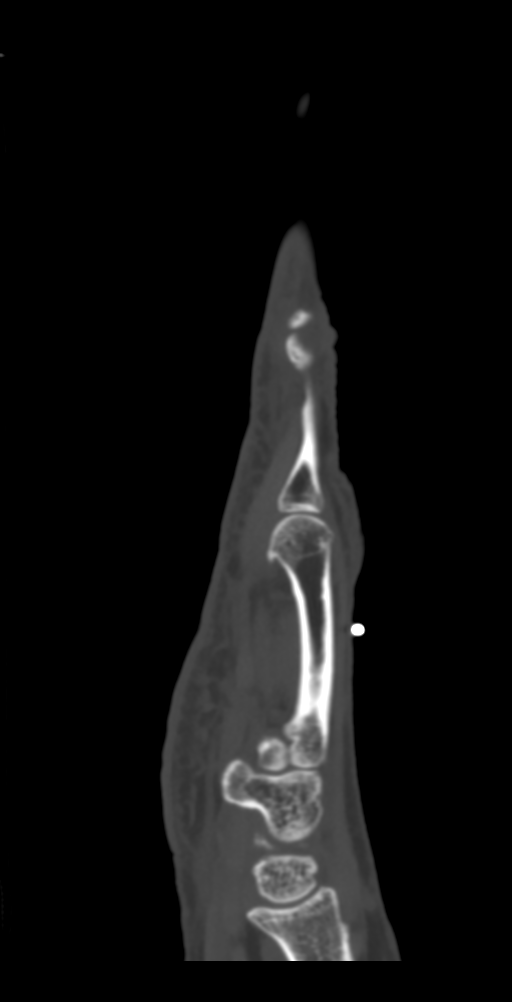
[im 157/209  bone]
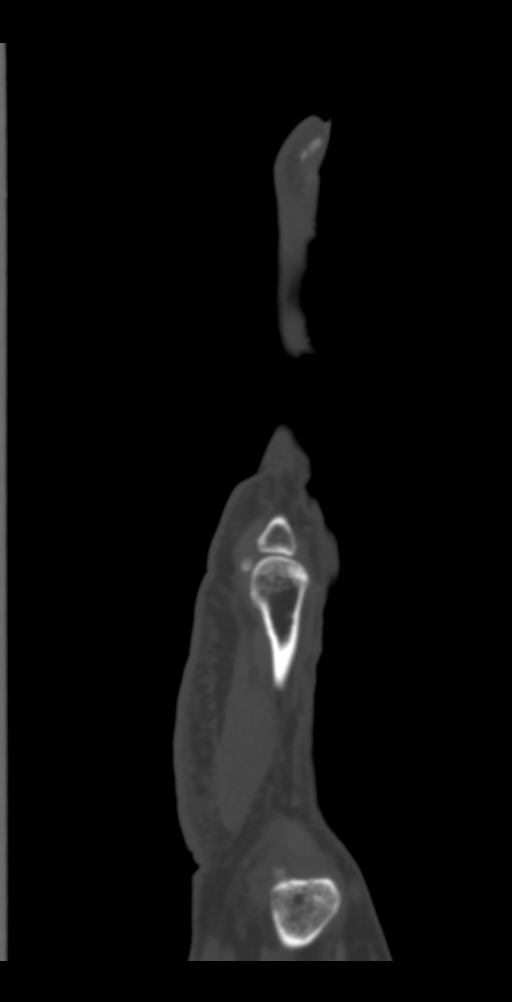

[12 of 33 positions shown; findings below may reference images not displayed]

FINDINGS: Bones/Joint/Cartilage

No evidence of acute fracture or dislocation. There are scattered
degenerative changes, most pronounced within the interphalangeal
joints and 1st carpometacarpal joint. No large joint effusions are
seen. The carpal bone alignment is normal.

Ligaments

Suboptimally assessed by CT.

Muscles and Tendons

Unremarkable.

Soft tissues

No foreign body, soft tissue emphysema or focal fluid collection
identified. Possible mild soft tissue swelling around the proximal
interphalangeal joints of the long and ring fingers.
IMPRESSION: 1. No evidence of acute fracture or dislocation within the left
hand.
2. No focal soft tissue abnormalities identified by CT.
3. Mild degenerative changes as described.

## 2024-02-13 ENCOUNTER — Emergency Department (HOSPITAL_COMMUNITY)
Admission: EM | Admit: 2024-02-13 | Discharge: 2024-02-13 | Disposition: A | Discharge status: Home / Self Care | Attending: Emergency Medicine | Admitting: Emergency Medicine

## 2024-02-13 ENCOUNTER — Other Ambulatory Visit: Payer: Self-pay

## 2024-02-13 ENCOUNTER — Encounter (HOSPITAL_COMMUNITY): Payer: Self-pay

## 2024-02-13 DIAGNOSIS — M545 Low back pain, unspecified: Secondary | ICD-10-CM | POA: Diagnosis present

## 2024-02-13 DIAGNOSIS — M5441 Lumbago with sciatica, right side: Secondary | ICD-10-CM | POA: Diagnosis not present

## 2024-02-13 MED ORDER — PREDNISONE 10 MG PO TABS: 40.0000 mg | ORAL_TABLET | Freq: Every day | ORAL | 0 refills | Status: AC

## 2024-02-13 MED ORDER — DIAZEPAM 5 MG/ML IJ SOLN
5.0000 mg | Freq: Once | INTRAMUSCULAR | Status: AC
  Administered 2024-02-13: 5 mg via INTRAMUSCULAR
  Filled 2024-02-13: qty 2

## 2024-02-13 MED ORDER — DEXAMETHASONE SODIUM PHOSPHATE 10 MG/ML IJ SOLN
10.0000 mg | Freq: Once | INTRAMUSCULAR | Status: AC
  Administered 2024-02-13: 10 mg via INTRAVENOUS
  Filled 2024-02-13: qty 1

## 2024-02-13 MED ORDER — KETOROLAC TROMETHAMINE 15 MG/ML IJ SOLN
15.0000 mg | Freq: Once | INTRAMUSCULAR | Status: AC
  Administered 2024-02-13: 15 mg via INTRAVENOUS
  Filled 2024-02-13: qty 1

## 2024-02-13 MED ORDER — METHOCARBAMOL 500 MG PO TABS: 500.0000 mg | ORAL_TABLET | Freq: Two times a day (BID) | ORAL | 0 refills | Status: AC

## 2024-02-13 NOTE — ED Provider Notes (Signed)
[name] EMERGENCY DEPARTMENT AT [facility] Provider Note   CSN: [id number] Arrival date & time: 02/13/24  9174     Patient presents with: Back Pain and Leg Pain   Jacqueline Haney is a 68 y.o. female.   Patient is a 68 year old female who presents emergency department chief complaint of right lower back pain with radiation down her right leg which has been ongoing for approximate the past 2 days.  Patient notes that she has had no associated urine or bowel incontinence, saddle paresthesias, gait changes, fever, chills, history of IV drug use, history of HIV, history of cancer, history of steroid use.  She denies any abdominal pain, nausea, vomiting, diarrhea.  She has had no changes in urination to include dysuria or hematuria.  She denies any chest pain or shortness of breath.  She has had no recent falls or blunt back trauma.   Back Pain Associated symptoms: leg pain   Leg Pain Associated symptoms: back pain        Prior to Admission medications   Medication Sig Start Date End Date Taking? Authorizing Provider  cyclobenzaprine  (FLEXERIL ) 10 MG tablet Take 1 tablet (10 mg total) by mouth 2 (two) times daily as needed for muscle spasms. 11/16/22   Zelaya, Oscar A, PA-C  gabapentin  (NEURONTIN ) 100 MG capsule Take 1 capsule (100 mg total) by mouth 3 (three) times daily. Patient not taking: Reported on 07/15/2022 11/24/20   Margrette Taft BRAVO, MD  lidocaine  (LIDODERM ) 5 % Place 1 patch onto the skin daily. Remove & Discard patch within 12 hours or as directed by MD 11/08/23   Idol, Julie, PA-C  pantoprazole  (PROTONIX ) 20 MG tablet Take 1 tablet (20 mg total) by mouth daily. 11/12/20 12/12/20  Henderly, Britni A, PA-C  predniSONE  (DELTASONE ) 10 MG tablet 6, 5, 4, 3, 2 then 1 tablet by mouth daily for 6 days total. 11/08/23   Idol, Julie, PA-C  rizatriptan  (MAXALT ) 10 MG tablet SMARTSIG:1 Tablet(s) By Mouth Patient taking differently: Take 10 mg by mouth as needed for migraine.  SMARTSIG:1 Tablet(s) By Mouth 05/22/22   Elnor Hila P, DO  sucralfate  (CARAFATE ) 1 g tablet Take 1 tablet (1 g total) by mouth 4 (four) times daily -  with meals and at bedtime for 10 days. 11/12/20 11/22/20  Henderly, Britni A, PA-C  SUMAtriptan  (IMITREX ) 50 MG tablet SMARTSIG:1 Tablet(s) By Mouth 1-2 Times Daily Patient not taking: Reported on 07/15/2022 05/22/22   Elnor Hila P, DO    Allergies: Aspirin, Codeine, Hydrocodone , Oxycodone -acetaminophen , and Tape    Review of Systems  Musculoskeletal:  Positive for back pain.  All other systems reviewed and are negative.   Updated Vital Signs BP 118/77 (BP Location: Left Arm)   Pulse 64   Temp 97.6 F (36.4 C) (Oral)   Resp 20   Ht 5' 5 (1.651 m)   Wt 75.8 kg   SpO2 97%   BMI 27.79 kg/m   Physical Exam Vitals and nursing note reviewed.  Constitutional:      Appearance: Normal appearance.  HENT:     Head: Normocephalic and atraumatic.     Nose: Nose normal.     Mouth/Throat:     Mouth: Mucous membranes are moist.   Eyes:     Extraocular Movements: Extraocular movements intact.     Conjunctiva/sclera: Conjunctivae normal.     Pupils: Pupils are equal, round, and reactive to light.    Cardiovascular:     Rate and Rhythm: Normal  rate and regular rhythm.     Pulses: Normal pulses.     Heart sounds: Normal heart sounds.  Pulmonary:     Effort: Pulmonary effort is normal.     Breath sounds: Normal breath sounds.  Abdominal:     General: Abdomen is flat. Bowel sounds are normal. There is no distension.     Palpations: Abdomen is soft.     Tenderness: There is no abdominal tenderness. There is no guarding.   Musculoskeletal:        General: Normal range of motion.     Cervical back: Normal range of motion and neck supple.     Comments: Tender to palpation over lower lumbar spine and paraspinous muscles, no step-off or deformity, no overlying erythema or warmth, no bruising or swelling, no CVA tenderness   Skin:     General: Skin is warm and dry.   Neurological:     General: No focal deficit present.     Mental Status: She is alert and oriented to person, place, and time. Mental status is at baseline.     Cranial Nerves: No cranial nerve deficit.     Sensory: No sensory deficit.     Motor: No weakness.     Coordination: Coordination normal.     Gait: Gait normal.     Deep Tendon Reflexes: Reflexes normal.     Comments: Extension of bilateral great toes intact, extension and flexion at hips intact  Psychiatric:        Mood and Affect: Mood normal.        Behavior: Behavior normal.        Thought Content: Thought content normal.        Judgment: Judgment normal.     (all labs ordered are listed, but only abnormal results are displayed) Labs Reviewed - No data to display  EKG: None  Radiology: No results found.   Procedures   Medications Ordered in the ED  dexamethasone (DECADRON) injection 10 mg (10 mg Intravenous Given 02/13/24 1009)  ketorolac  (TORADOL ) 15 MG/ML injection 15 mg (15 mg Intravenous Given 02/13/24 1007)  diazepam (VALIUM) injection 5 mg (5 mg Intramuscular Given 02/13/24 9047)                                    Medical Decision Making Patient is feeling much better at this time and is stable for discharge home.  Discussed with patient that we will continue treatment for sciatica at this point.  Do not suspect that any imaging is warranted the emergency department at this time.  She has had no recent falls or blunt trauma to her back.  Patient is TUNAFISH negative and has no concerning neurological deficits.  She has no associated abdominal pain or discomfort do not suspect underlying etiology such as acute appendicitis, cholecystitis, bowel torsion, diverticulitis, ovarian torsion or cyst, PID, tumor and abscess, pyelonephritis, kidney stone.  The need for close follow-up with her primary care doctor on outpatient basis was discussed.  Strict return precautions were discussed  as well for any new or worsening symptoms.  Patient voiced understanding and had no additional questions.  Risk Prescription drug management.        Final diagnoses:  None    ED Discharge Orders     None          Daralene Lonni JONETTA DEVONNA 02/13/24 1042    Melvenia Motto, MD  02/13/24 1726  

## 2024-02-13 NOTE — Discharge Instructions (Signed)
 Please follow-up closely with your primary care doctor on an outpatient basis.  Return to emergency department immediately for any new or worsening symptoms.  Please start taking the prednisone  tomorrow.

## 2024-02-13 NOTE — ED Triage Notes (Signed)
 Pt states she has had chronic lower back pain and this am pain began shooting down her right leg higher than 10 on pain scale. Took Motrin  with no success.

## 2024-03-15 ENCOUNTER — Emergency Department (HOSPITAL_COMMUNITY)

## 2024-03-15 ENCOUNTER — Emergency Department (HOSPITAL_COMMUNITY)
Admission: EM | Admit: 2024-03-15 | Discharge: 2024-03-15 | Disposition: A | Discharge status: Home / Self Care | Attending: Emergency Medicine | Admitting: Emergency Medicine

## 2024-03-15 ENCOUNTER — Encounter (HOSPITAL_COMMUNITY): Payer: Self-pay

## 2024-03-15 DIAGNOSIS — I808 Phlebitis and thrombophlebitis of other sites: Secondary | ICD-10-CM | POA: Insufficient documentation

## 2024-03-15 DIAGNOSIS — M25511 Pain in right shoulder: Secondary | ICD-10-CM | POA: Diagnosis present

## 2024-03-15 NOTE — Discharge Instructions (Signed)
 Please apply warm compresses to your arm, you may use a compression sleeve, as well as ibuprofen  to help with your symptoms.  Follow-up with your primary care physician.  If you develop new or worsening pain, swelling, redness, fever, chest pain, shortness of breath, or any other new/concerning symptoms then return to the ER.

## 2024-03-15 NOTE — ED Triage Notes (Signed)
 Pt worked yesterday and went home, nothing out of ordinary. Was awakened this am with right shoulder pain (between elbow and shoulder) at scale of 10. States she hasn't moved anything heavy recently. No blood thinners. Not Diabetic.

## 2024-03-15 NOTE — ED Provider Notes (Signed)
 [place] EMERGENCY DEPARTMENT AT [facility] Provider Note   CSN: [id number] Arrival date & time: 03/15/24  9188     Patient presents with: Right Shoulder Pain   Jacqueline Haney is a 68 y.o. female.   HPI 68 year old female presents with atraumatic right arm pain.  Started last night.  It is mostly in her upper arm in the middle aspect.  No swelling appreciated by patient or myself.  No fever, chest pain, shortness of breath, or weakness/numbness.  No trauma.  She took 2 ibuprofens this morning. No other pain.  Prior to Admission medications   Medication Sig Start Date End Date Taking? Authorizing Provider  cyclobenzaprine  (FLEXERIL ) 10 MG tablet Take 1 tablet (10 mg total) by mouth 2 (two) times daily as needed for muscle spasms. 11/16/22   Zelaya, Oscar A, PA-C  gabapentin  (NEURONTIN ) 100 MG capsule Take 1 capsule (100 mg total) by mouth 3 (three) times daily. Patient not taking: Reported on 07/15/2022 11/24/20   Margrette Taft BRAVO, MD  lidocaine  (LIDODERM ) 5 % Place 1 patch onto the skin daily. Remove & Discard patch within 12 hours or as directed by MD 11/08/23   Idol, Julie, PA-C  methocarbamol  (ROBAXIN ) 500 MG tablet Take 1 tablet (500 mg total) by mouth 2 (two) times daily. 02/13/24   Daralene Lonni BIRCH, PA-C  pantoprazole  (PROTONIX ) 20 MG tablet Take 1 tablet (20 mg total) by mouth daily. 11/12/20 12/12/20  Henderly, Britni A, PA-C  rizatriptan  (MAXALT ) 10 MG tablet SMARTSIG:1 Tablet(s) By Mouth Patient taking differently: Take 10 mg by mouth as needed for migraine. SMARTSIG:1 Tablet(s) By Mouth 05/22/22   Elnor Hila P, DO  sucralfate  (CARAFATE ) 1 g tablet Take 1 tablet (1 g total) by mouth 4 (four) times daily -  with meals and at bedtime for 10 days. 11/12/20 11/22/20  Henderly, Britni A, PA-C  SUMAtriptan  (IMITREX ) 50 MG tablet SMARTSIG:1 Tablet(s) By Mouth 1-2 Times Daily Patient not taking: Reported on 07/15/2022 05/22/22   Elnor Hila P, DO    Allergies: Aspirin,  Codeine, Hydrocodone , Oxycodone -acetaminophen , and Tape    Review of Systems  Constitutional:  Negative for fever.  Respiratory:  Negative for shortness of breath.   Cardiovascular:  Negative for chest pain.  Musculoskeletal:  Positive for myalgias. Negative for joint swelling.  Neurological:  Negative for weakness and numbness.    Updated Vital Signs BP 100/81   Pulse 61   Temp 97.9 F (36.6 C) (Oral)   Resp 20   Ht 5' 5.5 (1.664 m)   Wt 73 kg   SpO2 95%   BMI 26.38 kg/m   Physical Exam Vitals and nursing note reviewed.  Constitutional:      Appearance: She is well-developed.  HENT:     Head: Normocephalic and atraumatic.  Cardiovascular:     Rate and Rhythm: Normal rate and regular rhythm.     Pulses:          Radial pulses are 2+ on the right side.  Pulmonary:     Effort: Pulmonary effort is normal.  Abdominal:     General: There is no distension.  Musculoskeletal:     Right shoulder: No tenderness. Decreased range of motion (due to pain it will cause in her right upper arm).     Right upper arm: Tenderness present. No swelling, edema or deformity.     Right elbow: Normal range of motion. No tenderness.     Comments: Normal grip strength/sensation in right hand No  swelling appreciated to right upper arm. No erythema.   Skin:    General: Skin is warm and dry.  Neurological:     Mental Status: She is alert.     (all labs ordered are listed, but only abnormal results are displayed) Labs Reviewed - No data to display  EKG: None  Radiology: DG Humerus Right Result Date: 03/15/2024 CLINICAL DATA:  Atraumatic pain EXAM: RIGHT HUMERUS - 2 VIEW COMPARISON:  None Available. FINDINGS: No fracture or dislocation. Preserved adjacent joint spaces. Osteopenia. IMPRESSION: No acute osseous abnormality Electronically Signed   By: Ranell Bring M.D.   On: 03/15/2024 10:09   US  Venous Img Upper Right (DVT Study) Result Date: 03/15/2024 CLINICAL DATA:  Right upper  extremity pain. EXAM: RIGHT UPPER EXTREMITY VENOUS DOPPLER ULTRASOUND TECHNIQUE: Gray-scale sonography with graded compression, as well as color Doppler and duplex ultrasound were performed to evaluate the upper extremity deep venous system from the level of the subclavian vein and including the jugular, axillary, basilic, radial, ulnar and upper cephalic vein. Spectral Doppler was utilized to evaluate flow at rest and with distal augmentation maneuvers. COMPARISON:  None Available. FINDINGS: Contralateral Subclavian Vein: Respiratory phasicity is normal and symmetric with the symptomatic side. No evidence of thrombus. Normal compressibility. Internal Jugular Vein: No evidence of thrombus. Normal compressibility, respiratory phasicity and response to augmentation. Subclavian Vein: No evidence of thrombus. Normal compressibility, respiratory phasicity and response to augmentation. Axillary Vein: No evidence of thrombus. Normal compressibility, respiratory phasicity and response to augmentation. Cephalic Vein: Superficial thrombophlebitis noted in the right cephalic vein. The vein is distended and noncompressible. Basilic Vein: No evidence of thrombus. Normal compressibility, respiratory phasicity and response to augmentation. Brachial Veins: No evidence of thrombus. Normal compressibility, respiratory phasicity and response to augmentation. Radial Veins: No evidence of thrombus. Normal compressibility, respiratory phasicity and response to augmentation. Ulnar Veins: No evidence of thrombus. Normal compressibility, respiratory phasicity and response to augmentation. Venous Reflux:  None visualized. Other Findings:  No abnormal fluid collections. IMPRESSION: 1. No evidence of DVT within the right upper extremity. 2. Superficial thrombophlebitis of the right cephalic vein. Electronically Signed   By: Marcey Moan M.D.   On: 03/15/2024 09:55     Procedures   Medications Ordered in the ED - No data to display                                   Medical Decision Making Amount and/or Complexity of Data Reviewed Radiology: ordered and independent interpretation performed.    Details: No fracture   Patient's ultrasound shows a superficial thrombophlebitis.  Probably what is causing her symptoms.  I do not see any obvious signs of an infection.  No signs or symptoms of PE.  Hemodynamically stable.  Neurovascular intact.  Will have her treat this conservatively with compresses, compression, NSAIDs.  She reports she is able to tolerate NSAIDs.  Will have her follow-up with PCP.  Given return precautions.     Final diagnoses:  Thrombophlebitis of right arm    ED Discharge Orders     None          Freddi Hamilton, MD 03/15/24 1046

## 2024-04-06 ENCOUNTER — Encounter: Payer: Self-pay | Admitting: Radiology

## 2024-06-18 ENCOUNTER — Encounter: Payer: Self-pay | Admitting: Radiology
